# Patient Record
Sex: Female | Born: 1945 | Race: White | Hispanic: No | Marital: Married | Smoking: Former smoker
Health system: Southern US, Community
[De-identification: ages and names within clinical notes are randomized; demographics above are authoritative.]

## PROBLEM LIST (undated history)

## (undated) DIAGNOSIS — E039 Hypothyroidism, unspecified: Secondary | ICD-10-CM

## (undated) DIAGNOSIS — I499 Cardiac arrhythmia, unspecified: Secondary | ICD-10-CM

## (undated) DIAGNOSIS — Z87442 Personal history of urinary calculi: Secondary | ICD-10-CM

## (undated) DIAGNOSIS — Z8719 Personal history of other diseases of the digestive system: Secondary | ICD-10-CM

## (undated) DIAGNOSIS — J189 Pneumonia, unspecified organism: Secondary | ICD-10-CM

## (undated) DIAGNOSIS — R519 Headache, unspecified: Secondary | ICD-10-CM

## (undated) DIAGNOSIS — I272 Pulmonary hypertension, unspecified: Secondary | ICD-10-CM

## (undated) DIAGNOSIS — I4891 Unspecified atrial fibrillation: Secondary | ICD-10-CM

## (undated) DIAGNOSIS — M1990 Unspecified osteoarthritis, unspecified site: Secondary | ICD-10-CM

## (undated) DIAGNOSIS — F419 Anxiety disorder, unspecified: Secondary | ICD-10-CM

## (undated) DIAGNOSIS — H548 Legal blindness, as defined in USA: Secondary | ICD-10-CM

## (undated) DIAGNOSIS — I509 Heart failure, unspecified: Secondary | ICD-10-CM

## (undated) DIAGNOSIS — I10 Essential (primary) hypertension: Secondary | ICD-10-CM

## (undated) DIAGNOSIS — G2581 Restless legs syndrome: Secondary | ICD-10-CM

## (undated) DIAGNOSIS — H3552 Pigmentary retinal dystrophy: Secondary | ICD-10-CM

## (undated) DIAGNOSIS — R51 Headache: Secondary | ICD-10-CM

## (undated) DIAGNOSIS — R002 Palpitations: Secondary | ICD-10-CM

## (undated) DIAGNOSIS — K219 Gastro-esophageal reflux disease without esophagitis: Secondary | ICD-10-CM

## (undated) DIAGNOSIS — R0600 Dyspnea, unspecified: Secondary | ICD-10-CM

## (undated) DIAGNOSIS — Z8489 Family history of other specified conditions: Secondary | ICD-10-CM

## (undated) DIAGNOSIS — E785 Hyperlipidemia, unspecified: Secondary | ICD-10-CM

## (undated) HISTORY — PX: EYE SURGERY: SHX253

## (undated) HISTORY — PX: OTHER SURGICAL HISTORY: SHX169

---

## 1997-08-19 HISTORY — PX: KNEE ARTHROPLASTY: SHX992

## 2005-10-15 ENCOUNTER — Inpatient Hospital Stay (HOSPITAL_COMMUNITY): Admission: RE | Admit: 2005-10-15 | Discharge: 2005-10-18 | Payer: Self-pay | Admitting: Orthopedic Surgery

## 2005-10-16 ENCOUNTER — Ambulatory Visit: Payer: Self-pay | Admitting: Physical Medicine & Rehabilitation

## 2005-11-12 ENCOUNTER — Inpatient Hospital Stay (HOSPITAL_COMMUNITY): Admission: RE | Admit: 2005-11-12 | Discharge: 2005-11-16 | Payer: Self-pay | Admitting: Orthopedic Surgery

## 2005-11-12 ENCOUNTER — Ambulatory Visit: Payer: Self-pay | Admitting: Infectious Diseases

## 2005-11-13 ENCOUNTER — Encounter (INDEPENDENT_AMBULATORY_CARE_PROVIDER_SITE_OTHER): Payer: Self-pay | Admitting: *Deleted

## 2006-01-06 ENCOUNTER — Ambulatory Visit: Payer: Self-pay | Admitting: Infectious Diseases

## 2006-01-14 ENCOUNTER — Inpatient Hospital Stay (HOSPITAL_COMMUNITY): Admission: RE | Admit: 2006-01-14 | Discharge: 2006-01-20 | Payer: Self-pay | Admitting: Orthopedic Surgery

## 2006-01-16 ENCOUNTER — Ambulatory Visit: Payer: Self-pay | Admitting: Infectious Diseases

## 2006-03-18 ENCOUNTER — Inpatient Hospital Stay (HOSPITAL_COMMUNITY): Admission: RE | Admit: 2006-03-18 | Discharge: 2006-03-22 | Payer: Self-pay | Admitting: Orthopedic Surgery

## 2007-09-15 ENCOUNTER — Inpatient Hospital Stay (HOSPITAL_COMMUNITY): Admission: RE | Admit: 2007-09-15 | Discharge: 2007-09-19 | Payer: Self-pay | Admitting: Orthopedic Surgery

## 2011-01-01 NOTE — Op Note (Signed)
[name], [name]               ACCOUNT NO.:  [id number]   MEDICAL RECORD NO.:  [id number]          PATIENT TYPE:  INP   LOCATION:  NA                           FACILITY:  [facility]   PHYSICIAN:  [name]. [name], M.D.  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   FINDINGS INCLUDED:  No obvious signs of infection clinically. There is  synovial fluid. A gram stain and culture were negative from the stat  sent off. There was no evidence of any failed leaking fluid.  No loose  components.   PROCEDURE:  Revision left total knee replacement.   COMPONENTS USED:  A Smith-Nephew knee system with a size 3 Oxinium  femoral component with 15 mm medial distal augmented and a 10 mm distal  lateral augment.  A size 2 revision tibial tray with a 16 x 160 mm  sleeve.  On the femoral side, I did use a 2 mm bolt positioned at the  lateral aspect with the 16 x 160 mm screw fluted stem. An 11 mm  constraint articular insert was used as a stabilized insert.  In  addition two Zimmer cables were utilized of the Vitallium alloy set for  reapproximation of the osteotomy to the femur.   SURGEON:  [name].   ASSISTANT:  [place].   ANESTHESIA:  General.   BLOOD LOSS:  800 cc.   FLUIDS:  Per anesthesia, but did include two units of packed red blood  cells.   COMPLICATIONS:  None apparent.   DRAINS:  Times one.   TOURNIQUET TIME:  Was a total of 54 minutes despite the fact that the  duration of the case was four hours and 15 minutes.   INDICATIONS:  [name] is a 65 year old female known to me on  initial presentation with a painful knee. Procedure workup at the time  was  negative for infection. Revision surgery was carried out. Subsequent  persistent pain lead to the concerns for infection ultimately leading to  one I and D and poly exchange followed by removal of implants, submitted  antibiotic spacer followed by revision implementation. All the time,  there was only a single culture ever obtained that indicated signs of  infection. She had persistent recurring problems and the pain complaints  with swelling of the knee.  Workup was negative for any infection  including aspirate.  Inflammatory markers were elevated. I had her  worked up for a nickel allergy. Initially dermatologic skin testing  followed by laboratory studies in [place], [place], indicate  moderate  nickel sensitivity.  She reported a history of inability to wear any  costume jewelry or anything that would potentially have nickel in it.   After extensive discussions with [name] about the risks and  benefits of proceeding with  this type of procedure including the  persistence of discomfort with failure to treat adequately leading to  further dramatic surgical procedures, consent was obtained.   PROCEDURE IN DETAIL:  The patient was brought to the operative theater.  Once adequate anesthesia was established initially antibiotics were  held, but once the arthrotomy was created the vancomycin was given. The  left lower extremity was prescrubbed and prepped and draped over a  nonsterile thigh tourniquet. The leg was exsanguinated and tourniquet  elevated. The patient's previous incision was excised removing the scar.  An arthrotomy was created due to the scar and limited motion that was  noted.  A quad snip was made approximately 6 cm above the patella enough  for easy exposure. Following this, she had debridement exposure  including the proximal medial field and now attempted to remove the  component.   It is at this part of the procedure that took the advance majority of  the time.  It took approximately 2-1/2 hours to remove the implant.  After initially trying to remove them in a standard fashion by use of  oscillating saw osteotome using a gate Gigli saw for the femur, I was  unable to remove, because my concern of significant disruption.  Actually in the femoral component, I was able to dislodge the femoral  component from the sleeve and spin construct, thus leaving the cement.  The cemented sleeve would needed to be removed.   After considerable time and effort trying to do this, I felt that it was  in the best interest to perform a femoral osteotomy. We created an  osteotomy over the anterior aspect of the femur. After some more time  using the osteotomes, I was able to remove the stem.  I then had to use  a series of drills to remove the cement plug was removed. other than the  osteotomy was created and the defects and previous revision surgery,  there was no significant.  I did place a wire proximal initially to  prevent propagation of any cracks from the osteotomy. At this point, I  did use hand reamers just to get an idea of the size of the canal and  what would need to be done.  I then packed off this area and the pins in  the tibia. The tibial side had proved to be more difficult even than the  femur. After again some time, I tried to remove proximally with no  effort or concerns for significant destruction of tibia. I did make at  this point, a more of an osteotomy or window type appearance. After  freeing up as much as I possibly could in attempting to remove it, there  was a fracture of the tibia in the medial aspect of the tibia.   The lateral aspect of the knee as well as the tibia including the  anterior tubercle area remained intact.  I was able to remove the tibial  prosthesis at this point.  At this point, more time was spent in  removing the cement and the cement restricter distally. Once this was  done, I was now able to attend to the  prosthesis. The entire field  changed gloves and we redraped down the field keeping everything else  sterile.  I irrigated the wound out with approximately three liters of  normal saline solution at this point. Given the nature of the bone that  remaining, the prosthesis was determined fairly easily based on the  selected  size and __________ present. I felt that the patient's tibial  height was normal and did not need further augmentation based on the  removed component.   The size of the tibia was to be a size 2. Again, I decided to use the 16  mm x 160 mm stem base on the stem and the position of the components  fit. I put the tibial trial in with the 14 mm stem initially sitting on  the lateral proximal tibias that reference. As for the distal femur, I  had used some augments on the old revision stem and looking at the  lateral view of my radiographs, I was concerned about potential B[place]  unless I added more augmentation distally in an effort to help to  restore the joint line.  After trialing, I determined that the size 3  femur would fit best. We adjusted the dial and bolt of the tibia and  their bolt position laterally to help with this position. Using the 15  mm augment medially and laterally, I had good fit. There were no other  cuts or reference cuts that needed to be done.   At this point, all trial components were removed after further  preparation of the bone. Please note that after the initial efforts at  removing the prosthesis were slow due to the cement. I had let the  tourniquet down after 18 minutes. I irrigated this wound down again  after the tourniquet was re-elevated at 250 mmHg. The site of the cement  this in two batches to allow me to focus on the cement technique and  fixation.  The trial components were opened and constructed on the back  field. The cement was mixed and the tibial component first. Given the  fact that this was a press fit tibial stem, I only  cemented the proximal  aspect. I basically placed the tibia into the tibia and impacted it to a  level over the lateral proximal tibia with good fit distal. I then  packed in cement around this area. Reapproximating the posterolateral  bone to the cement in close proximity to the proximal tibia. This is the  best that could be done and there was no way to internally fix this.   Once the tibial cement had cured, I attended to the femur. The femur was  constructed on the back table as was the tibia and placed into the femur  the same way.  The proximal portion of the femur was coated with cement  and further defects were closed and then the femur was impacted into the  femoral shaft with good fixation of this fluted stem.   Once the test cement was removed, I did a trial reduction of the poly  height. I ended up using an 11 mm poly and used the constrained version  based on the proximal and medial peel that needed to be required. Did  not want to realign the patient's MCL long term.   Once the surfaces were prepared, the final 11 mm constrained insert was  impacted into the locking mechanism. The knee was reduced. At this  point, we had to open up the Dall-Miles cables, which needed to be  flashed and prepared.  This added some more time to the case. Two Dall-  Miles cables were used to completely reapproximate the bone on over top  of the anterior osteotomy of the femur. This was carried out without  complications. A wire was left proximally to  prevent  fracture  propagation into the femur. The knee was copiously irrigated with normal  saline solution. Pulsavage was carried out multiple times during the  case.  Tourniquet was let down after the final insert was placed. The  total tourniquet time was 58 minutes.   Attention was now directed to closure. Due to the patient's concern and  nickel allergy, the wound was closed with #1 Vicryl closing the quads  snip area of the proximal tibia  and around to the distal aspect of the  knee. 2-0 Vicryl was used in the subcu layer followed by 2-0 nylon on  the skin. The skin was clean and dry dressed sterilely with a Xeroform  and a bulky sterile dressing. She was brought to the recovery room and  extubated in stable condition.      [name], M.D.  Electronically Signed     MDO/MEDQ  D:  09/15/2007  T:  09/16/2007  Job:  [id number]

## 2011-01-01 NOTE — H&P (Signed)
[name], [name]               ACCOUNT NO.:  [id number]   MEDICAL RECORD NO.:  [id number]           PATIENT TYPE:   LOCATION:                                 FACILITY:   PHYSICIAN:  Benjamin L. [name], [place]        DATE OF BIRTH:   DATE OF ADMISSION:  DATE OF DISCHARGE:                              HISTORY & PHYSICAL   SURGEON:  Madlyn Frankel. Charlann Boxer, M.D.   PROCEDURE:  Revision of left total knee arthroplasty.   CHIEF COMPLAINT:  Left knee pain.   HISTORY OF PRESENT ILLNESS:  [name] is a 65 year old female with a  history of left knee pain with a history of left total knee replacement  and subsequent lab work revealing metal allergies specifically to  nickel.  She has been refractory to all conservative treatment and has  had persistent swelling and pain.  She is pre-surgically assessed by her  primary care physician, Dr. Margaree Mackintosh.   PAST MEDICAL HISTORY:  1. Osteoarthritis with left total knee replacement.  2. Migraine headaches.  3. Dyslipidemia.   PAST SURGICAL HISTORY:  Left total knee replacement.   SOCIAL HISTORY:  The patient is married.  Primary caregiver will be her  husband after surgery.   ALLERGIES:  1. BENADRYL.  2. SEPTRA.   MEDICATIONS:  1. Neurontin 600 mg 1-2 p.o. q.h.s.  2. Inderal 40 mg 3 times daily.  3. Requip 2 mg 4 times daily.  4. Estradiol 1 mg 1 time daily.  5. Simvastatin 80 mg 1 time daily.  6. Xyzal 5 mg tablet 1 tablet q.h.s.  7. Levothyroxine 100 mcg tablet take 1 tablet every other day      alternating with 0.088 mg every other day.   Verified medication dosage and frequency amount with the patient at time  of admission.   REVIEW OF SYSTEMS:  HEENT:  Has some blurred vision, dentures and  ringing in the ears.  Otherwise see HPI.   PHYSICAL EXAMINATION:  VITAL SIGNS:  Pulse 60, respirations 18, blood  pressure 152/80.  GENERAL:  Awake, alert, oriented, well-developed, well-nourished, no  acute distress.  NECK:  Supple.  No  carotid bruits.  CHEST/LUNGS:  Clear to auscultation bilaterally.  BREASTS:  Deferred.  HEART:  Regular rate and rhythm.  S1-S2 distinct.  ABDOMEN:  Soft, nontender, nondistended.  Bowel sounds are present.  GENITOURINARY:  Deferred.  EXTREMITIES:  She has 0-100 range of motion.  She has a well-healed  midline incision.  She has brisk capillary refill.  Was unable to  palpate dorsalis pedis pulse today.  SKIN:  No cellulitis.  NEUROLOGIC:  Intact distal sensibilities.   LABORATORY DATA:  Labs, EKG, chest x-ray are pending presurgical  testing.   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   PLAN:  Plan of action is revision of left total knee arthroplasty using  Oxinium and titanium based implants with no nickel.   Date of surgery is September 15, 2007 by surgeon Dr. Durene Romans at  [facility].   Postoperative medications were provided at  time of history and physical  which included Lovenox, Robaxin, iron, aspirin, Colace, MiraLax.  Postoperative pain medicine will be provided at time of surgery.           ______________________________  [name] [place], [place]     BLM/MEDQ  D:  09/04/2007  T:  09/04/2007  Job:  [id number]   cc:   Margaree Mackintosh  Fax: [phone]

## 2011-01-01 NOTE — H&P (Signed)
NAMECHANTALLE, [name]               ACCOUNT NO.:  [id number]   MEDICAL RECORD NO.:  [id number]           PATIENT TYPE:   LOCATION:                                 FACILITY:   PHYSICIAN:  Benjamin L. Mann, [place]   DATE OF BIRTH:  1946-05-29   DATE OF ADMISSION:  DATE OF DISCHARGE:                              HISTORY & PHYSICAL   ADDENDUM TO MEDICATION CORRECTION   Modified medication list per this rather than the original history and  physical.   1. Neurontin 600 mg 1 or 2 at bedtime.  2. Levoxyl 100 mcg one at bedtime.  3. Requip 2 mg 1 tablet four times daily.  4. Inderal 40 mg 1 tablet three times daily.  5. Simvastatin 80 mg one time a day.  6. Estradiol 1 mg one a day in the morning.  7. NoHist extended 1 tablet two times daily.  8. Lyrica 150 mg one the morning and one at night.   Previous job number was [id number].           ______________________________  [name]. [name], [place]     BLM/MEDQ  D:  09/04/2007  T:  09/04/2007  Job:  [id number]

## 2011-01-04 NOTE — H&P (Signed)
[name], [name]               ACCOUNT NO.:  [id number]   MEDICAL RECORD NO.:  [id number]          PATIENT TYPE:  INP   LOCATION:  1515                         FACILITY:  [facility]   PHYSICIAN:  Madlyn Frankel. Charlann Boxer, M.D.  DATE OF BIRTH:  1946-05-07   DATE OF ADMISSION:  10/15/2005  DATE OF DISCHARGE:  10/18/2005                                HISTORY & PHYSICAL   PRINCIPAL DIAGNOSIS:  Painful left total knee replacement.   HISTORY OF PRESENT ILLNESS:  [name] is a 65 year old female who is  status post a left total knee replacement in February 2006.  She has never  really been satisfied with this procedure.  She was reported to me on second  opinion and evaluation.  Work-up for inferior was negative based on lab  results with a normal white count,sed rate and CRP.  Bone scan had indicated  minimal evidence of uptake on the femoral tibial side.  However, this is  within a year of the surgery which made the results questionable.   Nonetheless after review with Mrs. Fanton her current situation and  reviewing the non-operative measures for painful total knee replacement, she  wished to proceed with total knee replacement revision.  Extensive  discussions had been carried out in the office to make sure that she  understood the risks and benefits, the benefits being that we could  potentially improve her symptoms but we have a 50% chance of not affecting  her pain.  She already had the decent range of motion so it cannot  necessarily affect that.   PAST MEDICAL HISTORY:  1.  History of two left knee arthroscopies.  2.  Total knee replacement in 2006.  3.  Hysterectomy.  4.  Cataract surgery.  5.  Right wrist surgery.  6.  Wrist cyst removal.  7.  History of right knee arthroscopy.  8.  History of migraines and blurred vision.   FAMILY HISTORY:  Noncontributory.   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   ALLERGIES:  SEPTRA AND BENADRYL.   CURRENT MEDICATIONS:  Lipitor, Zyrtec, Requip, gabapentin, propranolol,  Syntest, and levothyroxine.   REVIEW OF SYSTEMS:  Otherwise unremarkable at the time of evaluation with no  upper respiratory, pulmonary, cardiac, gastrointestinal, and genitourinary  issues over the past two weeks.   PHYSICAL EXAMINATION:  GENERAL:  She is a healthy female in no acute  distress.  She walks with a limp favoring her left knee.  She is  appropriately dressed.  VITAL SIGNS:  She has a temperature of 98.2, pulse 60, blood pressure  127/72.  HEENT:  Normal ocular movements. Normal speech.  No slurred speech.  NECK:  Supple without any nodes and no bruits on auscultation.  CHEST:  Clear to auscultation bilaterally.  HEART:  Regular rate and rhythm.  No murmurs.  ABDOMEN:  Soft, nontender with positive bowel sounds.  EXTREMITIES:  Examination of her left lower extremity reveals that she has  near full extension passively and actively.  She has full flexion to 110  degrees with stable ligaments.  A little bit toggle in flexion. However, no  large effusion.  Skin is otherwise intact with no signs of erythema or  significant swelling. Otherwise she is neurovascularly stable.   RADIOGRAPHS:  Radiographs reveal total knee replacement components that  appear to be well-positioned. Bone scan indicating loosened uptake of the  femoral and tibial side, but this was indeteminate.  No obvious signs of  clinical loosening.   IMPRESSION:  Painful left total knee replacement.   PLAN:  I agree with Mrs. Liebman's recurrent situation and as outlined  extensively in our discussion.  The evaluation and work-up for painful total  knee replacement is difficult.  Without an ideal source of why her knee  would be hurting her, the results of knee replacement and revision are  sometimes suspect.  Nonetheless, she cannot tolerate any further  nonoperative care or observation and wishes to proceed with total knee   replacement revision.  Risks and benefits were discussed. Consent will be  obtained based on the discussion in the office on October 07, 2005.      Madlyn Frankel Charlann Boxer, M.D.  Electronically Signed     MDO/MEDQ  D:  10/20/2005  T:  10/21/2005  Job:  [id number]

## 2011-01-04 NOTE — H&P (Signed)
NAMEJEZABEL, LECKER NO.:  [id number]   MEDICAL RECORD NO.:  [id number]           PATIENT TYPE:   LOCATION:                                 FACILITY:   PHYSICIAN:  Madlyn Frankel. Charlann Boxer, M.D.       DATE OF BIRTH:   DATE OF ADMISSION:  DATE OF DISCHARGE:                                HISTORY & PHYSICAL   CHIEF COMPLAINT:  Pain in my left knee.   PRESENT ILLNESS:  This 65 year old with knee problems have been going on for  a considerable period of time.  She has had 2 knee arthroscopies to the left  knee.  She had a total knee replacement arthroplasty in 2006.  She has had a  revision with a conversion of her left knee to a Dupuy rotating platform  posterior stabilizing knee system on October 15, 2005.  She did fairly well  after that surgery but recently has had increasing pain and discomfort into  the left knee.  Her progress with physical therapy and range of motion was  diminished due to his pain.  After much consideration including the risks  and benefits of surgery; it was decided that this knee is indeed infected;  and with the appropriate laboratory findings, it was decided to go ahead and  remove the total knee components and put an antibiotic spacer into the left  knee.   We are admitting her day, she will receive a PICC line.  Since she has to  drive a considerable distance, and since we were planning to have her as a  to follow case today, but due to these reduced staffing for evening, the  case would have to be done in the early evening or later.  I Discussed this  both with the patient and with her husband and they were in agreement that  they would rather wait when all the usual total joint staff was available  for this procedure.   PAST MEDICAL HISTORY:  There is really no change from her admission of  10/15/2005.  Other than the knee, as mentioned above.  She has had a  hysterectomy, cataract surgery, right wrist surgery, cyst removal from the  wrist as well.  She has migraines and blurred vision.   FAMILY HISTORY:  Noncontributory   SOCIAL HISTORY:  She is a housewife.  She has no intake of alcohol or  tobacco products.   CURRENT MEDICATIONS:  1.  Levothroid 100 mcg q.p.m.  2.  Lipitor 10 mg q.a.m.  3  Neurontin 600 mg h.s.  1.  Inderal 40 mg p.o. b.i.d.  2.  Requip 1 mg t.i.d.  3.  Zyrtec 100 mg p.o. h.s.  4.  __________  h.s. 1 daily.  5.  Robaxin 500 mg p.o. p.r.n. muscle spasm.  6.  Vicodin p.o. p.r.n.   REVIEW OF SYSTEMS:  Essentially noncontributory, essentially negative other  than her left knee.   PHYSICAL EXAMINATION:  GENERAL:  An alert, cooperative, friendly 65 year old  female seen lying on preadmitting stretcher.  She is accompanied by her  husband, Billey Gosling.  HEENT:  Normocephalic PERLA intact. EOM intact.  Oropharynx is clear.  CHEST:  Clear to auscultation.  No rhonchi, no rales.  HEART:  Regular rate and rhythm.  No murmurs are heard.  ABDOMEN:  Soft, nontender.  Liver and spleen not felt.  GENITALIA AND RECTAL:  Not done, not pertinent to present illness.  EXTREMITIES:  Left knee seen with tenderness to palpation; and somewhat  swollen with some erythema.  There is no drainage noted.  NEUROVASCULAR:  Intact to left lower extremity.   ADMITTING DIAGNOSIS:  Infected left total knee replacement arthroplasty.   PLAN:  The patient will undergo removal of the total knee components and  antibiotic spacer implants.      Dooley L. Cherlynn June.      Madlyn Frankel Charlann Boxer, M.D.  Electronically Signed    DLU/MEDQ  D:  01/14/2006  T:  01/14/2006  Job:  [id number]   cc:   Madlyn Frankel Charlann Boxer, M.D.  Fax: [phone]

## 2011-01-04 NOTE — Op Note (Signed)
NAMEWILMETTA, [name] NO.:  [id number]   MEDICAL RECORD NO.:  [id number]          PATIENT TYPE:  INP   LOCATION:  1519                         FACILITY:  [facility]   PHYSICIAN:  Madlyn Frankel. Charlann Boxer, M.D.  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   PROCEDURE:  Reimplantation of left total knee replacement.   COMPONENTS USED:  DePuy revision knee system with a size 2.5 femur, TC-3  femur with 4-mm augments medially and laterally, posteriorly, 8 mm medially  to help with valgus, a 30 x 13-mm cemented stem with a 20-mm femoral sleeve  to account for femoral defects.  I used a 35 patellar dome.  I used a size 2  MBT revision cemented tray with a 29 cemented tibial sleeve, no augments and  a 12.5 x 2.5 polyethylene insert, posterior-stabilizing.   SURGEON:  Madlyn Frankel. Charlann Boxer, M.D.   ASSISTANT:  Cherly Beach, P.A.-C   ANESTHESIA:  General.   BLOOD LOSS:  100 mL.   TOURNIQUET TIME:  Ninety minutes at 250 mmHg.   DRAINS:  x1.   COMPLICATIONS:  None.   INDICATION FOR PROCEDURE:  [name] is a very pleasant 65 year old female  with a history of left total knee replacement performed on the outside.  She  had had persistent pain throughout a year.  Infective workup had been  negative and subsequently underwent a revision total knee replacement  approximately 5 months ago.  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   An attempted at an I&D and poly exchange was carried out, based on the  sensitivity of the bug identified.  This subsequently recurred with failure.   She then underwent a resection arthroplasty approximately 2 months ago and  has been on  IV Ancef for 6 weeks, off antibiotics for the past 2 weeks.   Examination had revealed that she had no significant swelling or erythema on  her knee, no reports of fevers or chills over the past 2 weeks off  antibiotics.  I reviewed with her risks and benefits of reinfection,  persistent discomfort and stiffness in the knee, given the mobilization that  she has had.   Consent obtained.   PROCEDURE IN DETAIL:  The patient was brought to operative theater.  Once  adequate anesthesia was established, the patient's left lower extremity was  prepped and draped in a sterile fashion over a proximal nonsterile  tourniquet.  The patient's old incision was utilized and sharp dissection  was carried out to identify and to enter soft tissue planes.  Arthrotomy was  created and cultures obtained; Ancef was then given preoperatively.   Knee exposure was obtained carefully  I did place a pin in the proximal  tibial to prevent tibial tubercle avulsion and obtained the exposure without  other techniques  by recreating gutters and debriding scar.  Once the knee  exposure was obtained, I removed the cement spacer block as well as the  cement rods that were placed into the femur and tibia.   Given the fact that I had revised this patient's knee before, I felt very  comfortable with the joint line and the cut that I had made previously.   There was a touch bit of varus on the previously placed revision component,  so I make some cuts along the proximal lateral tibia in order to freshened  this up.   There was no change in the sizing.  I went ahead and prepared the tibia for  the component.  There was a defect that was left after removal of the cement  last time, so I used a 29-mm sleeve and prepared the tibia for this and this  would help with the rotational component.   Once this was carried out, attention was directed to the femur.  Again, I  knew the sizing of the femur to be a size 2.5.  There was  some bone loss  centrally as well as some posteriorly.  I felt that if I used 4-mm augments,  that it helped to maintain my normal external rotation of the component, but  also drop the femur down, helping with my flexion gap.   I also felt that I needed to use an 8-mm augment medially off the distal  side in order to maintain a valgus position of the component and maintain  the joint line at the previously cut surface.   I did use a 20-mm femoral sleeve to fill in some of the defect that was  present from bone loss as well as using the 30-mm cemented stem.   Cement restrictors were placed deep.  The canals were irrigated.  Trial  reduction was carried out with the above components.  With the 12.5 poly,  the knee came to full extension.  Flexion was at this point well-tolerated  with patella tracking very well with a 35 button.  There was tension on the  soft tissues due to the previous flexion contracture due to the  immobilization in a straight position.   Given all these parameters, the final components were brought the field.  Once the canal and cut surface were prepared finally, the tibial component  was cemented in first with a cement gun; I did use vancomycin in the cement  and I used 2 g of vancomycin for 2 bags of cement for the tibia and the same  for the femur.   The tibial component was cemented in first followed by the femur.  I then  placed a 12.5 spacer and then cemented in a 35 patellar button that had been  freshened up and redomed.   Once this cement had cured, excessive cement was debrided out of the  posterior aspect of the knee and the final 12.5 poly posterior-stabilized  rotating platform insert was then placed into the tibial component.   At this point, all the final components were in place and everything felt  nice and stable.  The only problem at this point with the tightness and contracture of her soft tissue.  I tried to reapproximate the extensor   mechanism in flexion and I was unable to do this to 90 degrees due to the  tension and it was thus reapproximated at about 60 degrees.   Since I had sutures in place, I did examine  the stability of the knee.  I  was able to take the knee to about 90 degrees passively with a lot of  tension on the soft tissues.   I did use #1 PDS as suture knot and #1 Vicryl in the closure.   The remaining wound was closed in layers with staples on the skin.  The skin  was cleaned, dried and dressed sterilely with Adaptic dressing, sponges and  tape.  She was taken to the recovery room with a sterile bulky Jones  dressing in  place.      Madlyn Frankel Charlann Boxer, M.D.  Electronically Signed     MDO/MEDQ  D:  03/20/2006  T:  03/20/2006  Job:  [id number]

## 2011-01-04 NOTE — Op Note (Signed)
[name], [name]               ACCOUNT NO.:  [id number]   MEDICAL RECORD NO.:  [id number]          PATIENT TYPE:  INP   LOCATION:  1504                         FACILITY:  [facility]   PHYSICIAN:  Madlyn Frankel. Charlann Boxer, M.D.  DATE OF BIRTH:  [birth year]   DATE OF PROCEDURE:  01/15/2006  DATE OF DISCHARGE:                                 OPERATIVE REPORT   PREOPERATIVE DIAGNOSIS:  Infected left total knee replacement.   POSTOPERATIVE DIAGNOSIS:  Infected left total knee replacement.   PROCEDURE:  Resection arthroplasty, left total knee, with placement of  antibiotic spacer following an incision and drainage with 6 L of normal  saline solution.   SURGEON:  Madlyn Frankel. Charlann Boxer, M.D.   ASSISTANT:  None.   ANESTHESIA:  General.   BLOOD LOSS:  50 mL.   TOURNIQUET TIME:  2 hours 15 minutes with a 10-minute break at the 1 hour  mark.   DRAINS:  X1.   COMPLICATIONS:  None.   INDICATION FOR PROCEDURE:  [name] is a very pleasant 65 year old patient  of mine, whom I have been treating for some time.  She initially presented  with a painful index primary knee replacement performed by an outside  physician.  The workup at the time was negative for infection.  However, she  complained of pain ever since the day her surgery.  At the time of the  revision surgery, tissues appeared to be normal, bone quality appeared to be  good.  There was no concern for obvious infection.  No cultures were  obtained.   Revision knee surgery was carried out with a complicated course.  Unfortunately, in the postoperative period she was doing a normal activity  when she developed pain.  It was an unusual circumstance.  Her knee swelled.  Aspiration revealed infection.  Based on the acuity of the involvement and  when I saw her, she was taken to the operating room for I&D and polyethylene  exchange.  She had initially done very well from this and was on the IV  antibiotics for 6 weeks.  Approximately 2-3 weeks  following this she  presented to the office again with a rather innocuous motion in her problem  and resulting in knee swelling.  Aspiration revealed 4000 white cells and  rare staph aureus or gram-positive cocci.  Based on these findings and the  concern that perhaps this is more of a chronic infection, I discussed with  the patient treatment options, including going back to the operating room  for resection arthroplasty.  Risks and benefits were discussed and the  treatment modality percentages provided.  Consent was obtained.   PROCEDURE IN DETAIL:  The patient was brought to the operative theater.  Once adequate anesthesia was administered, the patient was positioned supine  on the operative table with a proximal thigh tourniquet placed.  The left  lower extremity was then prepped and draped in a sterile fashion.  The  patient's previous incision was utilized.  The patient had a significant  amount of scar tissue.  Tissue planes were identified for the extensor  mechanism.  Medial parapatellar arthrotomy was then created, synovectomy  carried out, upon entry old sutures removed.  The knee exposure was obtained  routinely.  Once this was carried out, a straight osteotome was used  amputate the rotating platform post, removing the polyethylene insert.  At  this point attention was directed to the femoral component using a small  thin oscillating saw blade.  I was able to work around the cement-prosthetic  interval, freeing this up.  Then with the use of osteotomes I was able to  free up the component, which was stem component.  Attention was directed to  this area.  Utilizing the [facility] cement removal set.  With the osteotomes,  gouges as well as drills and back-scratching devices, I was able to remove  the entire portion of cement without complication.  Following this,  attention was directed to the tibial side.   Again, utilizing a thin oscillating saw I was able to get around and  work  around the prosthesis to the point were I could knock it cephalad with the  use of an osteotome.  It was removed without significant bone loss.  Cement  was removed again in the same fashion utilizing the [facility]  instrumentation.   Following the removal of the tibia, attention was direct to the patella and  again using the thin oscillating saw, I was able to remove this.  The  patella lugs were then drilled out with a drill and debrided of cement.   Following this debridement, of components, the knee was irrigated with 6 L  of normal saline solution pulse lavage.   Following this, the cement was mixed on the back table with a 1 g of  vancomycin and 1.2 g of Tobramycin per bag of cement with 4 bags total being  mixed.  Once the cement was ready, I did inject some cement into chest tubes  to place down into the femoral and tibial surfaces.  The rest of the cement  was used as a mold to make a cement spacer.  Another portion was made into a  shield.  Once the cement had cured, the portions were placed into the  femoral and tibial canal and into this space.   Once this was carried out, the extensor mechanism was reapproximated over a  medium Hemovac drain with #1 PDS sutures, 2-0 Vicryl on the subcu layer and  staples on the skin.  The patient's knee was cleaned, dried and dressed  sterilely with Xeroform dressing sponges and a bulky sterile dressing.  She  was placed in a knee immobilizer and brought to the recovery room in stable  condition and without apparent complication.   She will be admitted to the hospital, where she already has a PICC line  placed for a IV antibiotics.  Infectious disease was to be consulted.      Madlyn Frankel Charlann Boxer, M.D.  Electronically Signed     MDO/MEDQ  D:  01/15/2006  T:  01/16/2006  Job:  [id number]

## 2011-01-04 NOTE — Discharge Summary (Signed)
NAMEGERALDY, AKRIDGE               ACCOUNT NO.:  [id number]   MEDICAL RECORD NO.:  [id number]          PATIENT TYPE:  INP   LOCATION:  1604                         FACILITY:  [facility]   PHYSICIAN:  Madlyn Frankel. Charlann Boxer, M.D.  DATE OF BIRTH:  [birth year]   DATE OF ADMISSION:  09/15/2007  DATE OF DISCHARGE:  09/19/2007                               DISCHARGE SUMMARY   ADDENDUM:   DISCHARGE DIAGNOSIS:  Acute blood loss anemia with transfusion.     ______________________________  [name] [name], [place]      Madlyn Frankel. Charlann Boxer, M.D.  Electronically Signed    BLM/MEDQ  D:  11/17/2007  T:  11/17/2007  Job:  [id number]

## 2011-01-04 NOTE — Discharge Summary (Signed)
NAMEANGALA, HILGERS               ACCOUNT NO.:  [id number]   MEDICAL RECORD NO.:  [id number]          PATIENT TYPE:  INP   LOCATION:  1504                         [facility]ACILITY:  [facility]   PHYSICIAN:  Madlyn Frankel. Charlann Boxer, M.D.  DATE OF BIRTH:  [birth year]   DATE OF ADMISSION:  01/14/2006  DATE OF DISCHARGE:  01/20/2006                                 DISCHARGE SUMMARY   ADMITTING DIAGNOSES:  Infected left total knee replacement arthroplasty.   DISCHARGE DIAGNOSIS:  1.  Infected left total knee replacement arthroplasty.  2.  Postoperative blood loss anemia treated with transfusion.   CONSULTATIONS:  Dr. Roxan Hockey and Dr. Orvan Falconer.   OPERATION:  On Jan 15, 2006, the patient underwent resection arthroplasty of  the left knee with placement of antibiotic spacer following incision and  drainage with 6 liters of normal saline solution performed by Dr. Charlann Boxer with  no assistant.   BRIEF HISTORY:  This 65 year old lady had been seen by [place] for continuing  problems concerning her left total knee.  She has had a workup with  aspiration and culture showing no infection to be negative.  She has had  pain since the day of her total knee replacement.  She underwent removal and  re-implant in the past.  She did very well with that but developed pain into  the knee.  She had swelling to the knee, aspiration was done, and infection  was noted.  I&D was done with polyethylene exchange.  She was on IV  antibiotic for six weeks.  She presented again to the office aspiration of a  swollen knee showed 4000 white cells with rare Staph aureus and gram  positive cocci.  It was felt that this is a chronic infection perhaps  seeding from somewhere else, but it was certainly causing a myriad of  problems in this knee.  After much discussion including the risks and  benefits of surgery, it was decided to go ahead with the above procedure.   COURSE IN THE HOSPITAL:  The patient tolerated the surgical procedure quite  well.  She was placed on vancomycin immediately perioperatively and  postoperatively.  It was noted that she had a drop in her hemoglobin  postoperatively to 7.  She was transfused with blood bringing her hemoglobin  up to 11.7.  This was considered postoperative blood loss anemia.  We asked  infectious disease to consult with [place] concerning this patient. Dr. Roxan Hockey  was kind enough to help [place] along with recommendations concerning this  infection.  Fortunately, it was methicillin sensitive and eventually a PICC  line was placed and we were using Ancef as an antibiotics.  This was  recommended by Dr. Roxan Hockey.   On the day of discharge, the patient was stable.  Home health was arranged  for her care, ambulation, as well as IV antibiotics via PICC line.  She has  strong family support.  A TROM brace was placed on her leg postoperatively  to protect it.  It was locked in full extension.  This is to be removed for  skin care, of course, but  when she is up or in bed, she is to wear this  brace.  She is to continue with home medications and diet.   Other laboratory values in the hospital showed a preoperative CBC with an  anemia of 10.9 and hematocrit 32.5, this dropped to 7 with hematocrit of  20.6.  We transfused her and brought her hemoglobin up to 11.7 with  hematocrit 34.6.  Blood chemistries remained normal.  Early culture showed  no growth x3 days and no anaerobes.   CONDITION ON DISCHARGE:  Improved, stable.   PLAN:  The patient is discharged home.  Continue with IV Ancef.  She is to  follow with infectious disease in their clinic.  She is to return to see Dr.  Charlann Boxer two weeks after the date of surgery.  Vicodin is given for discomfort,  Robaxin 500 mg as a muscle relaxant, and Trinsicon for her anemia.  Dry  dressing to the knee p.r.n. and, again, use TROM brace at all times except  for skin care in bed.  She will have IV antibiotics in the form of Ancef for  six weeks by a PICC  line.  We did have the patient on Lovenox but she had a  friend who died from that according to her history, so she was fearful of  Lovenox and will use Coumadin at home for DVT prophylaxis.  She is  encouraged to call should she have any problem and continue her other home  medications and diet.      Dooley L. Cherlynn June.      Madlyn Frankel Charlann Boxer, M.D.  Electronically Signed    DLU/MEDQ  D:  02/28/2006  T:  02/28/2006  Job:  [id number]

## 2011-01-04 NOTE — Op Note (Signed)
[name], [name] NO.:  [id number]   MEDICAL RECORD NO.:  [id number]          PATIENT TYPE:  INP   LOCATION:  X003                         FACILITY:  [facility]   PHYSICIAN:  Madlyn Frankel. Charlann Boxer, M.D.  DATE OF BIRTH:  1946-05-12   DATE OF PROCEDURE:  10/15/2005  DATE OF DISCHARGE:                                 OPERATIVE REPORT   PREOPERATIVE DIAGNOSIS:  Failed painful left total knee replacement.   POSTOPERATIVE DIAGNOSIS:  Failed painful left total knee replacement.   FINDINGS:  There was no evidence of infection on cell count with 900 white  blood cell count and no organisms seen.  One white blood cell seen on Gram's  stain.   PROCEDURE:  Revision, left total knee replacement.   COMPONENTS USED:  DePuy rotating platform posterior stabilized knee system  with a size 2.5 TC3 femur with a 30 mm cemented stem x 13 mm with a 4 mm  distal lateral augment.  I did use a +2 bolt faced anterior.  The tibia side  was a mobile bearing or an MBT tibial tray with a 12.5 mm posterior  stabilized rotating platform polyethylene liner and a 2.5 to match the  femur.  The tibial size was a size 2.  I retained the patient's previous  patella, based on a depth of patellar height of 23 mm.  I was worried about  resecting too much bone, and the patella tracked without application of  pressure.   SURGEON:  Madlyn Frankel. Charlann Boxer, M.D.   ASSISTANTDruscilla Brownie. Cherlynn June.   ANESTHESIA:  General.   TOURNIQUET TIME:  Eight-five minutes at 250 mmHg.   BLOOD LOSS:  150 cc.   COMPLICATIONS:  None.   DRAINS:  One.   INDICATIONS FOR PROCEDURE:  Ms. Mumpower is a 65 year old female who I had  seen in the office for painful left total knee replacement.  Her knee  replacement was performed about year ago.  Workup had been extensive and had  ruled out systemic infection with normal white blood cell count, sed rate,  and CRP.  Other concerns in the differential were slight arthrofibrosis,  as  the patient's range of motion was approximately 115 degrees, lacking only a  few degrees of full flexion and extension of 120 degrees.  Ligaments  appeared to be stable, but on examination, she always had some tightness due  to pain, so it was difficult to ascertain whether or not there was mid  flexion and stability or not.  Other differential would have included the  possibility of RSD; however, she had no signs of skin sensitivity, skin  coloration change, or excessive needs of narcotics.   After reviewing with the patient extensively the risks and benefits of  revision surgery for painful knee with no obvious findings, she at this  point feels that she has failed all conservative attempts and understood the  risks that she may have persistent pain postoperatively.  Other risks of  infection, DVT, component failure, were reviewed but the primary one stress  was the persistent pain postoperatively without an  identifiable source of  failure.  Consent was obtained.   PROCEDURE IN DETAIL:  The patient was brought to the operating theater.  Once adequate anesthesia and preoperative antibiotics, 1 gm of Ancef, were  administered, the patient was positioned supine with the proximal thigh  tourniquet placed.  The left lower extremity was prepped and draped in a  sterile fashion following a prescrub.  The patient's previous incision was  utilized.  Medial parapatellar arthrotomy was then carried out.  Knee  exposure was obtained, including synovectomy and reestablished of the medial  and lateral gutters.  There does not appear to be an excessive amount of  synovial scarring superiorly that would be consistent with a ensuing  patellar clunk phenomenon and a posterior stabilized knee.  Following knee  exposure in a routine fashion, the polyethylene liner was removed without  complications.  I then used a Gigli saw along the anterior phalange of the  femoral component down through the  chamfers.  Then utilizing the 1/4 inch  osteotome, I was able to remove the femoral component without complicating  features.  There was just a little bit of bone loss in the distal condylar  region.   Excessive cement was removed in this area, which was not much.  At this  point, attention was directed to the tibia.  Tibial exposure was obtained  routinely without complications.  Utilizing a very thin oscillating saw, I  did undercut the tibial tray.  Further debridement allowed for exposure.  One of the available tools off of the DePuy set was utilized as an  extraction tool to slap the component out without complication.  No  significant bone loss.  Again, excessive cement removed off the tibial tray  surface.   Once this was carried out, attention was now directed to preparing the bone  for revision knee.  I then hand reamed both canals up to 15 mm to allow for  passage of a 13 mm stem, if needed cementing.   An intramedullary rod was then positioned into the femoral shaft.  I then  made a distal femoral cut.  Essentially, what this amounted to was nothing  off the distal medial side and 4 mm off of the lateral distal side.  For  this reason, we chose a distal lateral augment laterally.   At this point, attention was directed to the tibial surface for sizing  purposes.  An intramedullary rod was positioned, and a 2 degree cut was made  off of the top aspect of the proximal tibia.  This was a very shin cut with  minimal bone removed.   We then sized the tibia to be a size 2.  Final preparation of the tibia was  then carried out, at least from a rotating platform standpoint for the MBT  tray with proximal reaming.  With the size 2 tibia noted, attention was  redirected back to the femur.  Holding femoral component trials up, I felt  that a size 3 or 2.5 would fit best.  Initially, the intramedullary rod was positioned with a centralizing tube distally.  When I placed the size 3   cutting block, despite using the +2 bolt, I felt that it sat very high  anterior, and I was worried about potential femoral over-stuffing.   With this decision, I went ahead with a 2.5 femur.  With using a crab claw,  I was able to identify that this sat right on the anterior femoral cortex.  At this point, the anterior and posterior cuts were made.  There was no  anterior cut to be made and only proximally a 2 mm cut posterior, both  medially and laterally.  No augments needed.  The chamfer cuts were minimal.   At this point, a trial reduction was carried out.  Note that I did cut to a  TC3, as the box cut from the previous component was deep enough to accept  this without significant bone removal.  This would also add to the stability  of the components.   A trial component was placed on the tibial side and keel punched.  The  femoral component was placed.  With a 12.5 poly, the knee came to full  extension and was stable on flexion.  Again, I evaluated the patella at this  point, and the precut measurement was 23 mm.  I felt that if I removed the  patella, there would be concern about removing too much bone.  For this  reason, I kept the patella, and it tracked without application of pressure.  I did not think this would cause any complications.   Given this with the trial components, after the final components were  brought into the field, this included a size 2.5 femur, TC3 with a 30 x 13  mm cemented stem, 4 mm distal lateral augment.  On the tibial side, I used a  size 2 MBT tibial tray with no cemented stem.  The trial components were  removed.  The cement restrictor was placed in the femoral side, and the  femur and tibia surfaces were then cleaned.  Note that throughout the case  for exposure and debridement, synovial scar debridement was carried out, as  well as around the patella.  At the time, a decision to keep the current  patella.   Once the final components were  brought into the field, I did inject the knee  with 60 cc of Marcaine with epinephrine and Toradol.  This would help with  synovial bleeding.   Cement was ready.  The tibial component cemented first, followed by the  femur.  A 12.5 spacer was placed.  The knee brought out to extension.  All  cement cured.  Excessive cement was removed.  Once the cement had cured, the  knee was flexed, and residual cement removed.  Following exposure, I then  did place the 12.5 final posterior stabilized size 2.5 tibial tray.  Again,  the knee came out to full extension and was stable throughout flexion and  range of motion with patella tracking normal.   At this point, the knee was again irrigated with pulsatile lavage.  The  medium Hemovac drain was placed.  An extensor mechanism was then  reapproximated using #1 PDS and flexion.  The remainder of the wound was closed in layers with staples on the skin.  The patient was extubated and  brought to the recovery room in stable condition.      Madlyn Frankel Charlann Boxer, M.D.  Electronically Signed     MDO/MEDQ  D:  10/15/2005  T:  10/15/2005  Job:  [id number]

## 2011-01-04 NOTE — H&P (Signed)
[name], [name] NO.:  [id number]   MEDICAL RECORD NO.:  [id number]           PATIENT TYPE:   LOCATION:                               FACILITY:  [facility]   PHYSICIAN:  Madlyn Frankel. Charlann Boxer, M.D.  DATE OF BIRTH:  [birth year]   DATE OF ADMISSION:  03/18/2006  DATE OF DISCHARGE:                                HISTORY & PHYSICAL   CHIEF COMPLAINT:  Problems with my left knee.   HISTORY OF PRESENT ILLNESS:  This 65 year old white female seen by [place] for  continuing problems concerning pain into her left knee. The patient has a  history of total knee replacement arthroplasty done at another facility.  Subsequently this got infected and she underwent a removal of total knee  with antibiotics spacers. The cultures showed a rare Staphylococcus aureus.  That surgery was performed on Jan 15, 2006. Since that time, she has been on  antibiotics via a PICC line and has done well. She has been placed on a TROM  brace since to allow ambulation. She now presents for surgery concerning  removal of the antibiotic impregnated spacers and reimplant of total knee  replacement arthroplasty. That will occur on March 18, 2006.   PAST MEDICAL HISTORY:  Essentially no change from her previous history and  physical.   FAMILY HISTORY:  Noncontributory.   SOCIAL HISTORY:  She has no intake of alcohol or tobacco products. She is a  housewife.   CURRENT MEDICATIONS:  1.  Levothroid 100 mcg q.p.m.  2.  Neurontin 600 mg h.s.  3.  Inderal 40 mg p.o. b.i.d.  4.  Requip 1 mg t.i.d.  5.  Robaxin 500 mg for muscle spasms.  6.  Vicodin for discomfort.   REVIEW OF SYSTEMS:  Negative other than left knee.   PHYSICAL EXAMINATION:  GENERAL:  Alert and cooperative, friendly, 59-year-  old white female accompanied by her husband and grandson.  VITAL SIGNS:  Blood pressure 122/86, pulse 80, respirations 12.  HEENT:  Normocephalic. PERRLA. EOM intact. Oropharynx is clear.  CHEST:  Clear to auscultation.  No rhonchi, no rales.  HEART:  Regular rate and rhythm. No murmurs are heard.  ABDOMEN:  Soft, nontender, liver, spleen not felt.  GENITALIA/RECTAL/PELVIC/BREASTS:  Not done not pertinent to present illness.  EXTREMITIES:  The left lower extremity is in a TROM brace.  SKIN:  The skin is inspected, no drainage is noted. Mild erythema.   ADMISSION DIAGNOSES:  1.  Status post resection left total knee replacement arthroplasty with      antibiotic implant.  2.  Hypothyroidism.  3.  Dyslipidemia.  4.  History of migraine headaches.   PLAN:  The patient will undergo removal of the antibiotic impregnated  spacers and reimplant of total knee replacement arthroplasty. We will  continue her on antibiotics postoperatively.      Dooley L. Cherlynn June.      Madlyn Frankel Charlann Boxer, M.D.  Electronically Signed    DLU/MEDQ  D:  03/03/2006  T:  03/03/2006  Job:  [id number]

## 2011-01-04 NOTE — H&P (Signed)
[name], [name] NO.:  [id number]   MEDICAL RECORD NO.:  [id number]          PATIENT TYPE:  INP   LOCATION:  5033                         FACILITY:  [facility]   PHYSICIAN:  Madlyn Frankel. Charlann Boxer, M.D.  DATE OF BIRTH:  [birth year]   DATE OF ADMISSION:  11/12/2005  DATE OF DISCHARGE:                                HISTORY & PHYSICAL   CHIEF COMPLAINT:  Pain in my left knee.   HISTORY OF PRESENT ILLNESS:  This 65 year old white female, who underwent  total knee replacement arthroplasty, has done quite well with that knee  until recently when she had increasing pain into the knee.  The knee was  aspirated in the office, and purulent matter was seen with a white count  well over 90,000.  Indeed, this is an infected knee, and we have rescheduled  her for an I&D with washout and change of poly.  She is totally aware of the  situation.  Risks and benefits of surgery have been described to the  patient.   PAST MEDICAL HISTORY:  The patient has occasional headache and has  hypothyroidism.   PAST SURGICAL HISTORY:  Hysterectomy in the past and left total knee  replacement angioplasty.   Dr. Margaree Mackintosh in [place], [place], is her family doctor.   CURRENT MEDICATIONS:  1.  Lipitor 10 mg daily.  2.  Inderal 40 mg daily.  3.  Estratest HS 1 daily.  4.  Requip 1 mg twice daily.  5.  Zyrtec 10 mg in the evening.  6.  Neurontin 600 mg in the evening.  7.  Levothyroxine 0.100 mcg in the evening.  8.  Hydrocodone for pain.   ALLERGIES:  She is allergic to SEPTRA and BENADRYL.   FAMILY HISTORY AND SOCIAL HISTORY:  Can be seen in previous chart.   REVIEW OF SYSTEMS:  Essentially negative other than as in History of Present  Illness.  No recent migraines.   PHYSICAL EXAMINATION:  GENERAL:  Alert, cooperative, friendly, 65 year old  white female.  VITAL SIGNS:  Blood pressure 113/77, pulse 79, respirations 20, temperature  98.1.  HEENT:  Normocephalic.  PERRLA.   EOM intact.  Oropharynx clear.  CHEST: Clear to auscultation. No rhonchi, no rales, no wheezes.  HEART: Regular rate and rhythm.  No murmurs are heard.  ABDOMEN: Soft, nontender.  Liver and spleen not felt.  GENITALIA/RECTAL:  Not done, not pertinent to present illness.  EXTREMITIES:  Left lower extremity is seen with somewhat red knee. There is  no active drainage.  She has limited range of motion secondary to pain.   ADMISSION DIAGNOSES:  1.  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  2.  Hypothyroidism.  3.  History of migraines.   PLAN:  The patient will be admitted for I&D to the left knee.  We will ask  infectious disease to follow along with [place]. We will also ask for PICC line.  We will follow infectious disease recommendations.  Cultures will be taken  at the time of surgery.  Dooley L. Cherlynn June.      Madlyn Frankel Charlann Boxer, M.D.  Electronically Signed    DLU/MEDQ  D:  11/12/2005  T:  11/13/2005  Job:  [id number]

## 2011-01-04 NOTE — Discharge Summary (Signed)
[name], [name]               ACCOUNT NO.:  [id number]   MEDICAL RECORD NO.:  [id number]          PATIENT TYPE:  INP   LOCATION:  1604                         FACILITY:  [facility]   PHYSICIAN:  Madlyn Frankel. Charlann Boxer, M.D.  DATE OF BIRTH:  [birth year]   DATE OF ADMISSION:  09/15/2007  DATE OF DISCHARGE:  09/19/2007                               DISCHARGE SUMMARY   ADMISSION DIAGNOSES:  1. Osteoarthritis.  2. Dyslipidemia.  3. Postmenopausal.  4. Restless leg syndrome.   DISCHARGE DIAGNOSES:  1. Osteoarthritis.  2. Dyslipidemia.  3. Restless leg syndrome.  4. Postmenopausal.   CONSULTATIONS:  None.   PROCEDURE:  Revision of left total knee replacement.   SURGEON:  Madlyn Frankel. Charlann Boxer, M.D.   ASSISTANT:  Dwyane Luo PA-C.   LABS:  Preadmission CBC:  Hematocrit 36.2, tracked throughout her course  of stay, at discharge it was 34.1 and stable.  Her platelets were 123.  Her white cell differential was no significant abnormalities.  Coags  were normal with an INR of 0.9.  Complete metabolic panel preadmission  showed sodium 140, potassium 3.9, glucose 121, BUN was 5, tracked  throughout with no significant trending; at discharge, sodium 137,  potassium 4.4, glucose 103, BUN 3.  Kidney function GFR greater than 16,  calcium 7.7.  UA was negative.   EKG:  Normal sinus rhythm.   RADIOLOGY:  No chest x-ray found in chart.   HOSPITAL COURSE:  The patient underwent revision of left total knee  replacement, tolerated the procedure well, was admitted to the  orthopedic floor.  Seen on day #1, was in a lot of pain.  She had stable  pressures.  Heart rate was between 80 and 90.  Her hematocrit was 26.1.  Went ahead and typed and crossed her, transfused 2 units.  Maintained  the Foley.  We started Lovenox DVT prophylaxis and recharged the Hemovac  for another 4 hours.  It was discontinued later that day.   Postop day #2, pain was decreased with the current pain medicine regimen  and her  hematocrit was back up to 29.8.  Her dressing was clean, dry and  intact.  She had limited PT/OT due to significant pain previously the  day before.  Was felt to be stable enough to discharge home.   Discharged her postop day #3.  Pain was still well-controlled.  Dressing  was changed.  The wound did not have any active drainage, it looked  good.  Neurovascular intact pulse of the left lower extremity.  Weightbearing as tolerated with increasing ambulatory progress, about 80  feet the day before.  Plan was to follow her through the weekend.   Seen on postop day #4.  Pain is still well-controlled.  Afebrile.  Vital  signs stable.  Incision was clear and dry.  Neurovascularly intact left  lower extremity.  We stopped the Ancef.  Hemovac had been out for some  time at this point.  We will continue the PT/OT with plans to send her  home on Monday.  She had increased ambulatory progress of 150 feet.  Later  on that day, the patient became tearful and stated she was ready  to go home.  Due to this is a difficult time getting assistance and felt  she could do just as well at home.  [name] was notified and she was  ready for discharge.   DISCHARGE DISPOSITION:  Discharged home in stable and improved  condition.   DISCHARGE ACTIVITIES:  Physical therapy weightbearing as tolerated.   DISCHARGE DIET:  Regular.   DISCHARGE WOUND CARE:  Keep dry.   DISCHARGE MEDICATIONS:  1. OxyContin 40 mg p.o. b.i.d.  2. Oxycodone 5 mg one to three p.o. q.3 hours pain.  3. Lovenox 40 mg subcu every 24 hours through September 29, 2007; then      aspirin 325 mg one p.o. daily x4 weeks.  4. Iron 324 mg p.o. t.i.d.  5. Colace 100 mg p.o. b.i.d.  6. MiraLax 17 grams p.o. daily.  7. Robaxin 1200 mg p.o. q.6 hours.   HOME MEDICATIONS:  1. Neurontin 600 mg one at bedtime.  2. Levoxyl 100 mcg one at bedtime.  3. Requip 2 mg t.i.d.  4. Inderal 40 mg two p.o. in the morning, one p.o. in the evening.  5.  Simvastatin 80 mg p.o. in the morning.  6. Estradiol 1 mg daily.  7. Lyrica 150 mg p.o. q.a.m. and p.o. at bedtime.   DISCHARGE FOLLOWUP:  Follow up with Dr. Charlann Boxer [phone] in 2 weeks.     ______________________________  [name] [name], [place]      Madlyn Frankel. Charlann Boxer, M.D.  Electronically Signed    BLM/MEDQ  D:  11/17/2007  T:  11/17/2007  Job:  [id number]

## 2011-01-04 NOTE — Discharge Summary (Signed)
NAMERELLA, EGELSTON NO.:  [id number]   MEDICAL RECORD NO.:  [id number]          PATIENT TYPE:  INP   LOCATION:  5033                         FACILITY:  [facility]   PHYSICIAN:  Madlyn Frankel. Charlann Boxer, M.D.  DATE OF BIRTH:  05-16-1946   DATE OF ADMISSION:  11/12/2005  DATE OF DISCHARGE:  11/16/2005                                 DISCHARGE SUMMARY   ADMITTING DIAGNOSES:  Acute infection left total knee replacement  arthroplasty.   DISCHARGE DIAGNOSES:  1.  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  2.  Mild postoperative anemia (non-symptomatic).   OPERATION:  1.  On November 12, 2005 the patient underwent incision and drainage of left      knee with 6 L of normal saline solution plus 1 L of bacitracin-laden      normal saline solution with pulse lavage.  2.  Polyethylene exchange of left knee with 12.5 x 2.5 polyethylene insert,      posterior stabilizing for MBT revision tray.  D.L. Idolina Primer, P.A.-C.      and Romeo Apple Man, P.A. student assisted.   CONSULTS:  Dr. Tinnie Gens C. Hatcher, infectious disease   BRIEF HISTORY:  This 65 year old lady four weeks out from a total knee  replacement arthroplasty did well until a week prior to this admission when  she had new onset of DVT.  Coumadin was stopped and the knee aspirated to  rule out hemarthrosis versus infection.  When the knee was aspirated  purulent material was noted.  She had a 96,000 white count of the knee fluid  which was high suspicion for infection.  I&D was indicated and the patient  was admitted as soon as possible for that procedure as mentioned above.   HOSPITAL COURSE:  Infectious disease saw the patient early on.  Dr. Ninetta Lights  felt that we should continue the vancomycin.  Cultures Gram smear showed no  organisms, moderate wbc, primarily PNNs with a few Staphylococcus aureus  seen.  It was sensitive to all but penicillin.  Ancef and rifampin was used  postoperatively.  The Rifampin was  discontinued on November 15, 2005.  Overall,  the patient had minimal tenderness to her knee and PICC line was placed for  long-term Ancef therapy.  On day of discharge the patient was afebrile.  She  did have some mild nausea prior to discharge but this resolved.  She was  discharged home with mild analgesics.  [facility] will continue  with her therapy.   LABORATORIES:  Preoperative CBC which showed a mild anemia, hemoglobin 10.7,  hematocrit 31.2.  Final hemoglobin was 8.7, hematocrit was 25.9.  Urinalysis  negative for urinary tract infection.  The culture showed few Staphylococcus  aureus.   CONDITION ON DISCHARGE:  Improved, stable.   PLAN:  The patient will continue with Ancef IV via PICC line.  Genevieve Norlander will  manage that.  She will take one aspirin a day at 325 mg b.i.d. for two  weeks.  No Coumadin was used.  Vicodin for discomfort.  Robaxin as a muscle  relaxant and Reglan for any  nausea.  Return to see Dr. Charlann Boxer at one week to  10 days from surgery and call if she should have any problems or questions.  She may weight bear as tolerated.      Dooley L. Cherlynn June.      Madlyn Frankel Charlann Boxer, M.D.  Electronically Signed    DLU/MEDQ  D:  12/12/2005  T:  12/13/2005  Job:  [id number]

## 2011-01-04 NOTE — Op Note (Signed)
[name], FITZWATER NO.:  [id number]   MEDICAL RECORD NO.:  [id number]          PATIENT TYPE:  INP   LOCATION:  5033                         FACILITY:  [facility]   PHYSICIAN:  Madlyn Frankel. Charlann Boxer, M.D.  DATE OF BIRTH:  1946/04/29   DATE OF PROCEDURE:  11/12/2005  DATE OF DISCHARGE:                                 OPERATIVE REPORT   PREOPERATIVE DIAGNOSIS:  Infected left total knee replacement.   POSTOPERATIVE DIAGNOSIS:  Acute infection, left total knee replacement.   OPERATION PERFORMED:  1.  Incision and drainage of left knee with 6 L of normal saline solution      plus 1 L of bacitracin laden normal saline solution with pulse lavage.  2.  Polyethylene exchange of left knee with a 12.5 x 2.5 polyethylene      insert, posterior stabilized for MBT revision tray.   SURGEON:  Madlyn Frankel. Charlann Boxer, M.D.   ASSISTANTDruscilla Brownie. Cherlynn June. and Dwyane Luo, [place] student.   ANESTHESIA:  General.   ESTIMATED BLOOD LOSS:  200 mL.   TOURNIQUET TIME:  45 minutes at 250 mmHg.   COMPLICATIONS:  None.   DRAINS:  x1.   INDICATIONS FOR PROCEDURE:  Ms. Krichbaum is a 65 year old female who is now  approximately four weeks out from her total knee replacement.  She had done  very well from her revision total knee replacement until about one week  prior to procedure when she presented to the office with some new onset DVT,  her Coumadin stopped, knee aspirated to rule out hemarthrosis versus  infection.  Aspirate revealed a purulent material.  Lab sent revealed 96,000  white count confirming suspicion for infection.   I had discussed with at time of initial aspiration and concerns about  infection that she should go to incision and drainage as soon as possible.  Surgery was set up for the next day.  The risks of surgery would be  recurrent infection, DVT, arthrofibrosis. Consent was obtained.   DESCRIPTION OF PROCEDURE:  The patient was brought to the operating theater.  Once  adequate anesthesia was established, the patient had received a gram of  Ancef.  The patient's left lower extremity was then prepped and draped over  the proximal thigh nonsterile tourniquet.  The previous incision was  excised.  Sharp dissection was carried down to the extensor mechanism.  The  medial parapatellar arthrotomy was then made with removal of sutures.  Knee  exposure obtained routinely.  Synovectomy carried out debridement of  tissues.  There was not a very large amount of purulent material that was  evident.  I has aspirated the knee before removing about 15 to 20 mL of  fluid; however, this did not look as bad.  Probably due to the acuity.   Once the knee was exposed, using osteotome to amputate the post on the  rotating platform knee, we then removed the polyethylene.  Knee exposure was  obtained, further debridement carried out with a rongeur and a Bovie  performing synovectomies.  Following this, the knee was irrigated with 6 L  of normal saline solution followed by a liter of bacitracin laden normal  saline solution and pulse lavage.  Following this, the knee was cleaned,  dried, hemostasis obtained.  The final revised poly 12.5 x size 2.5  polyethylene was then placed into the tibial tray and the knee reduced.  The  knee came to full extension with excellent range of motion at this point.   Note that the patient had developed significant amount of arthrofibrosis  over the past week requiring manipulation but this was not in the title of  the procedures.   Following placement of polyethylene, the extension mechanism was  reapproximated using a #1 PDS.  I then used 2-0 Monocryl in the skin  followed by 2-0 nylon on the skin edges.  The knee was cleaned, dried and  dressed sterilely with Xeroform and a bulky Jones dressing.  The patient was  then transferred to recovery room, extubated in stable condition.      Madlyn Frankel Charlann Boxer, M.D.  Electronically Signed      MDO/MEDQ  D:  11/12/2005  T:  11/14/2005  Job:  [id number]

## 2011-01-04 NOTE — Discharge Summary (Signed)
NAMEMELANNY, WIRE NO.:  [id number]   MEDICAL RECORD NO.:  [id number]          PATIENT TYPE:  INP   LOCATION:  1519                         FACILITY:  [facility]   PHYSICIAN:  Madlyn Frankel. Charlann Boxer, M.D.  DATE OF BIRTH:  Aug 06, 1946   DATE OF ADMISSION:  03/18/2006  DATE OF DISCHARGE:  03/22/2006                                 DISCHARGE SUMMARY   ADMITTING DIAGNOSES:  1. Status post resection left total knee replacement arthroplasty with      antibiotic implant.  2. Hypothyroidism.  3. Dyslipidemia.  4. History of migraine headaches.   DISCHARGE DIAGNOSES:  1. Postoperative anemia.  2. 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   OPERATION:  On March 08, 2006, the patient underwent reimplantation of left  total knee replacement arthroplasty.  Jenne Campus PA-C assisted.   BRIEF HISTORY:  This 65 year old lady, who had a left total knee replacement  arthroplasty at another facility, had persistent pain for a year after that  knee replacement.  The patient underwent a revision total knee replacement  arthroplasty 5 months prior to this admission.  Workup for infection was  negative at that time.  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  I&D and poly  exchange was done, but unfortunately the patient had recurring failure.  We  chose to do a resection arthroplasty 2 months prior to this admission, and  the patient has been on 6 weeks of IV Ancef.  We stopped the antibiotics  about 2 weeks prior to this admission.  The knee was quiet.  It had no  swelling or erythema.  She had no fevers or chills while she was off  antibiotics.  It was hoped that the infection previously treated was gone,  so after much discussion, including the risks and benefits of this  particular surgery, she underwent above procedure.   COURSE IN THE HOSPITAL:  The patient tolerated the surgical procedure quite  well.  We restricted her range of motion, as far as its passive nature was  concerned, to 90 degrees.  The patient had an episode of hypotension  postoperatively.  She did have anemia at 7.8 on the hemoglobin and  hematocrit of 23.8.  Unfortunately, due to certain antibiotics in her blood,  no match was found in the blood bank.  We, therefore, could not transfuse  her.  She actually was not that symptomatic.  She was ambulating, working  with physical therapy.  She certainly knew the total knee protocol, even  with the restrictions mentioned above.  Her knee wound was clean and dry.  Staples were intact.  She also remained afebrile.  It was felt that she  could be discharged with home health, and once the arrangements were made  she was discharged home.   The cultures done at surgery from her knee wound showed no white cells, no  organisms by Gram stain.  Culture showed no growth x2 days, and no anaerobes  were isolated.  Laboratory values in the hospital hematologically showed a  preoperative CBC with a normal  white count.  RBCs were 3.76, hemoglobin was  11.6, hematocrit was 34.50.  Differential was normal.  Final hemoglobin was  7.6, hematocrit of 21.9.  Blood chemistries remained normal.  Urinalysis was  negative for a urinary tract infection.  Cultures were as above.  No  electrocardiogram nor chest x-ray done on this admission.   CONDITION ON DISCHARGE:  Improved, stable.   PLAN:  The patient is discharged to her home.  She is to restrict flexion to  about 90 degrees.  She will be on continuing Coumadin protocol per pharmacy,  oxycodone 5 mg one every 4 hours for breakthrough pain, Tylenol two q.6h.,  Robaxin 500 mg p.r.n. muscle spasm and Trinsicon t.i.d. as an iron  supplement.  She is to only have gentle range of motion of the left knee,  and we cannot place her beyond 90 degrees.  This has to on for least 6 weeks  so will allow the tissue to heal.  She will [place] as home  health.  Return  to see Dr. Charlann Boxer about 2 weeks after the date of surgery, and certainly call  should she have any swelling or change in her knee.      Dooley L. Cherlynn June.      Madlyn Frankel Charlann Boxer, M.D.  Electronically Signed    DLU/MEDQ  D:  03/28/2006  T:  03/28/2006  Job:  [id number]

## 2011-01-04 NOTE — Discharge Summary (Signed)
[name], Boyer               ACCOUNT NO.:  [id number]   MEDICAL RECORD NO.:  [id number]          PATIENT TYPE:  INP   LOCATION:  1515                         FACILITY:  [facility]   PHYSICIAN:  Madlyn Frankel. Charlann Boxer, M.D.  DATE OF BIRTH:  Oct 18, 1945   DATE OF ADMISSION:  10/15/2005  DATE OF DISCHARGE:  10/18/2005                                 DISCHARGE SUMMARY   ADMISSION DIAGNOSES:  1.  Painful left total knee replacement.  2.  History of two left knee arthroscopies.  3.  History of total knee replacement in 2006.  4.  History of hysterectomy, cataract surgery, right wrist surgery.   DISCHARGE DIAGNOSES:  1.  Painful left total knee replacement.  2.  History of two left knee arthroscopies.  3.  History of total knee replacement in 2006.  4.  History of hysterectomy, cataract surgery, right wrist surgery.  5.  Mild postoperative anemia.   OPERATION:  On October 15, 2005, the patient underwent revision left total  knee replacement arthroplasty to DePuy rotating platform posterior  stabilized knee system.  The patella prosthesis from previous surgery was  retained, and the femoral and tibial components were cemented into the bone.  No evidence of infection on cell count, and no organisms seen at the time of  surgery.  Jenne Campus assisted.   BRIEF HISTORY:  This 65 year old lady had painful left total knee  replacement arthroplasty.  The knee was done at another office.  Systemic  infection as well as sed rate and CRP were all normal, ruling out infection.  After much discussion, including the risks and benefits of surgery, it was  decided to go ahead with the above procedure.   HOSPITAL COURSE:  The patient tolerated surgical procedure quite well.  She  was entered into the total knee protocol postoperatively.   We found that she was extremely sensitive to Coumadin with a marked increase  of INR in the past, so we advised pharmacy, and they worked very carefully  with the  patient with dispensing Coumadin.   Overall, she did quite well.  The wound remained clean and dry.  She was  ambulating and flexing and extending the knee with minimal problems with  pain.  She was anxious to go home and continue with her rehabilitation at  home with home health, so arrangements were made for that.  No critical  incidence while in the hospital.   Laboratory values in the hospital hematologically showed preoperative CBC  completely within normal limits.  Hemoglobin is 13.5 with a hematocrit of  39.5.  Final hemoglobin was 8.9 with a hematocrit of 25.8.  Blood  chemistries remained normal, and urinalysis was negative for a urinary tract  infection.  The fluid from the knee, which was sent to the lab showed no  growth in three days.  No organism was seen on Gram's stain, and anaerobic  showed no organisms, no anaerobes isolated.   Chest x-ray showed no active disease.   No electrocardiogram on this chart.   CONDITION ON DISCHARGE:  Improved, stable.   PLAN:  The  patient was discharged to her home in the care of her family, to  continue with home health.  Continue with home medications and diet.  She  can weight bear as tolerated.  She is to use dry dressing p.r.n.  Return to  see Dr. Charlann Boxer in two weeks after the date of surgery.   DISCHARGE MEDICATIONS:  Vicodin for pain, Robaxin for muscle relaxant and  Trinsicon to help build up her blood as well as Coumadin for DVT  prophylaxis.  She is to call should she have any problems or questions and  follow up with her medical doctor as needed.      [name].      Madlyn Frankel Charlann Boxer, M.D.  Electronically Signed    DLU/MEDQ  D:  10/23/2005  T:  10/23/2005  Job:  [id number]

## 2011-05-09 LAB — TYPE AND SCREEN
Donor AG Type: NEGATIVE
Donor AG Type: NEGATIVE
PT AG Type: NEGATIVE

## 2011-05-09 LAB — BASIC METABOLIC PANEL
BUN: 3 — ABNORMAL LOW
BUN: 5 — ABNORMAL LOW
CO2: 23
CO2: 26
Calcium: 7.2 — ABNORMAL LOW
Calcium: 7.7 — ABNORMAL LOW
Calcium: 9
Chloride: 110
Creatinine, Ser: 0.63
GFR calc Af Amer: >60
GFR calc non Af Amer: >60
Glucose, Bld: 121 — ABNORMAL HIGH
Potassium: 3.9
Potassium: 4.4
Sodium: 137
Sodium: 139
Sodium: 140

## 2011-05-09 LAB — GRAM STAIN: Gram Stain: NONE SEEN

## 2011-05-09 LAB — DIFFERENTIAL: Eosinophils Relative: 7 — ABNORMAL HIGH

## 2011-05-09 LAB — ANAEROBIC CULTURE

## 2011-05-09 LAB — CBC
HCT: 26.1 — ABNORMAL LOW
HCT: 36.2
Hemoglobin: 10.5 — ABNORMAL LOW
Hemoglobin: 12.7
Hemoglobin: 9.1 — ABNORMAL LOW
MCHC: 34.6
MCHC: 35
MCV: 90.4
MCV: 90.7
Platelets: 123 — ABNORMAL LOW
Platelets: 99 — ABNORMAL LOW
RBC: 3.3 — ABNORMAL LOW
RBC: 3.75 — ABNORMAL LOW
RBC: 4.03
RDW: 13
RDW: 14.5
RDW: 14.6
RDW: 14.6
WBC: 6.1

## 2011-05-09 LAB — POCT I-STAT 4, (NA,K, GLUC, HGB,HCT)
Glucose, Bld: 101 — ABNORMAL HIGH
HCT: 26 — ABNORMAL LOW
Hemoglobin: 8.8 — ABNORMAL LOW

## 2011-05-09 LAB — BODY FLUID CULTURE
Culture: NO GROWTH
Gram Stain: NONE SEEN

## 2011-05-09 LAB — PROTIME-INR: INR: 0.9

## 2011-05-09 LAB — URINALYSIS, ROUTINE W REFLEX MICROSCOPIC
Glucose, UA: NEGATIVE
Hgb urine dipstick: NEGATIVE
Specific Gravity, Urine: 1.027
pH: 6

## 2012-07-10 DIAGNOSIS — G5600 Carpal tunnel syndrome, unspecified upper limb: Secondary | ICD-10-CM | POA: Insufficient documentation

## 2014-08-19 HISTORY — PX: NECK SURGERY: SHX720

## 2015-06-01 DIAGNOSIS — E785 Hyperlipidemia, unspecified: Secondary | ICD-10-CM | POA: Insufficient documentation

## 2015-06-01 DIAGNOSIS — E039 Hypothyroidism, unspecified: Secondary | ICD-10-CM | POA: Diagnosis present

## 2016-10-02 DIAGNOSIS — K2100 Gastro-esophageal reflux disease with esophagitis, without bleeding: Secondary | ICD-10-CM | POA: Insufficient documentation

## 2016-10-02 DIAGNOSIS — E782 Mixed hyperlipidemia: Secondary | ICD-10-CM | POA: Insufficient documentation

## 2016-11-05 ENCOUNTER — Other Ambulatory Visit (HOSPITAL_COMMUNITY): Payer: Self-pay | Admitting: Emergency Medicine

## 2016-11-05 ENCOUNTER — Encounter (HOSPITAL_COMMUNITY): Payer: Self-pay

## 2016-11-05 DIAGNOSIS — G2581 Restless legs syndrome: Secondary | ICD-10-CM | POA: Diagnosis present

## 2016-11-05 NOTE — Patient Instructions (Signed)
[name]  11/05/2016   Your procedure is scheduled on: 11-11-16  Report to [facility] Main  Entrance take [facility]  elevators to 3rd floor to  Short Stay Center at [phone].  Call this number if you have problems the morning of surgery [phone]   Remember: ONLY 1 PERSON MAY GO WITH YOU TO SHORT STAY TO GET  READY MORNING OF YOUR SURGERY.  Do not eat food or drink liquids :After Midnight.     Take these medicines the morning of surgery with A SIP OF WATER: levothyroxine(Synthroid), propanolol(Inderal), pantoprazole(Protonix), risperidone(Risperdal), ropinirole(Requip)                                 You may not have any metal on your body including hair pins and              piercings  Do not wear jewelry, make-up, lotions, powders or perfumes, deodorant             Do not wear nail polish.  Do not shave  48 hours prior to surgery.              Men may shave face and neck.   Do not bring valuables to the hospital. [facility] IS NOT             RESPONSIBLE   FOR VALUABLES.  Contacts, dentures or bridgework may not be worn into surgery.  Leave suitcase in the car. After surgery it may be brought to your room.              Please read over the following fact sheets you were given: _____________________________________________________________________             [facility] - Preparing for Surgery Before surgery, you can play an important role.  Because skin is not sterile, your skin needs to be as free of germs as possible.  You can reduce the number of germs on your skin by washing with CHG (chlorahexidine gluconate) soap before surgery.  CHG is an antiseptic cleaner which kills germs and bonds with the skin to continue killing germs even after washing. Please DO NOT use if you have an allergy to CHG or antibacterial soaps.  If your skin becomes reddened/irritated stop using the CHG and inform your nurse when you arrive at Short Stay. Do not shave  (including legs and underarms) for at least 48 hours prior to the first CHG shower.  You may shave your face/neck. Please follow these instructions carefully:  1.  Shower with CHG Soap the night before surgery and the  morning of Surgery.  2.  If you choose to wash your hair, wash your hair first as usual with your  normal  shampoo.  3.  After you shampoo, rinse your hair and body thoroughly to remove the  shampoo.                           4.  Use CHG as you would any other liquid soap.  You can apply chg directly  to the skin and wash                       Gently with a scrungie or clean washcloth.  5.  Apply the CHG Soap  to your body ONLY FROM THE NECK DOWN.   Do not use on face/ open                           Wound or open sores. Avoid contact with eyes, ears mouth and genitals (private parts).                       Wash face,  Genitals (private parts) with your normal soap.             6.  Wash thoroughly, paying special attention to the area where your surgery  will be performed.  7.  Thoroughly rinse your body with warm water from the neck down.  8.  DO NOT shower/wash with your normal soap after using and rinsing off  the CHG Soap.                9.  Pat yourself dry with a clean towel.            10.  Wear clean pajamas.            11.  Place clean sheets on your bed the night of your first shower and do not  sleep with pets. Day of Surgery : Do not apply any lotions/deodorants the morning of surgery.  Please wear clean clothes to the hospital/surgery center.  FAILURE TO FOLLOW THESE INSTRUCTIONS MAY RESULT IN THE CANCELLATION OF YOUR SURGERY PATIENT SIGNATURE_________________________________  NURSE SIGNATURE__________________________________  ________________________________________________________________________   Jill MireIncentive Spirometer  An incentive spirometer is a tool that can help keep your lungs clear and active. This tool measures how well you are filling your lungs with  each breath. Taking long deep breaths may help reverse or decrease the chance of developing breathing (pulmonary) problems (especially infection) following:  A long period of time when you are unable to move or be active. BEFORE THE PROCEDURE   If the spirometer includes an indicator to show your best effort, your nurse or respiratory therapist will set it to a desired goal.  If possible, sit up straight or lean slightly forward. Try not to slouch.  Hold the incentive spirometer in an upright position. INSTRUCTIONS FOR USE  1. Sit on the edge of your bed if possible, or sit up as far as you can in bed or on a chair. 2. Hold the incentive spirometer in an upright position. 3. Breathe out normally. 4. Place the mouthpiece in your mouth and seal your lips tightly around it. 5. Breathe in slowly and as deeply as possible, raising the piston or the ball toward the top of the column. 6. Hold your breath for 3-5 seconds or for as long as possible. Allow the piston or ball to fall to the bottom of the column. 7. Remove the mouthpiece from your mouth and breathe out normally. 8. Rest for a few seconds and repeat Steps 1 through 7 at least 10 times every 1-2 hours when you are awake. Take your time and take a few normal breaths between deep breaths. 9. The spirometer may include an indicator to show your best effort. Use the indicator as a goal to work toward during each repetition. 10. After each set of 10 deep breaths, practice coughing to be sure your lungs are clear. If you have an incision (the cut made at the time of surgery), support your incision when coughing by placing a pillow or rolled up towels firmly  against it. Once you are able to get out of bed, walk around indoors and cough well. You may stop using the incentive spirometer when instructed by your caregiver.  RISKS AND COMPLICATIONS  Take your time so you do not get dizzy or light-headed.  If you are in pain, you may need to take or  ask for pain medication before doing incentive spirometry. It is harder to take a deep breath if you are having pain. AFTER USE  Rest and breathe slowly and easily.  It can be helpful to keep track of a log of your progress. Your caregiver can provide you with a simple table to help with this. If you are using the spirometer at home, follow these instructions: SEEK MEDICAL CARE IF:   You are having difficultly using the spirometer.  You have trouble using the spirometer as often as instructed.  Your pain medication is not giving enough relief while using the spirometer.  You develop fever of 100.5 F (38.1 C) or higher. SEEK IMMEDIATE MEDICAL CARE IF:   You cough up bloody sputum that had not been present before.  You develop fever of 102 F (38.9 C) or greater.  You develop worsening pain at or near the incision site. MAKE SURE YOU:   Understand these instructions.  Will watch your condition.  Will get help right away if you are not doing well or get worse. Document Released: 12/16/2006 Document Revised: 10/28/2011 Document Reviewed: 02/16/2007 Exit[employer] Patient Information 2014 ExitCare, [place].   ________________________________________________________________________  WHAT IS A BLOOD TRANSFUSION? Blood Transfusion Information  A transfusion is the replacement of blood or some of its parts. Blood is made up of multiple cells which provide different functions.  Red blood cells carry oxygen and are used for blood loss replacement.  White blood cells fight against infection.  Platelets control bleeding.  Plasma helps clot blood.  Other blood products are available for specialized needs, such as hemophilia or other clotting disorders. BEFORE THE TRANSFUSION  Who gives blood for transfusions?   Healthy volunteers who are fully evaluated to make sure their blood is safe. This is blood bank blood. Transfusion therapy is the safest it has ever been in the practice of  medicine. Before blood is taken from a donor, a complete history is taken to make sure that person has no history of diseases nor engages in risky social behavior (examples are intravenous drug use or sexual activity with multiple partners). The donor's travel history is screened to minimize risk of transmitting infections, such as malaria. The donated blood is tested for signs of infectious diseases, such as HIV and hepatitis. The blood is then tested to be sure it is compatible with you in order to minimize the chance of a transfusion reaction. If you or a relative donates blood, this is often done in anticipation of surgery and is not appropriate for emergency situations. It takes many days to process the donated blood. RISKS AND COMPLICATIONS Although transfusion therapy is very safe and saves many lives, the main dangers of transfusion include:   Getting an infectious disease.  Developing a transfusion reaction. This is an allergic reaction to something in the blood you were given. Every precaution is taken to prevent this. The decision to have a blood transfusion has been considered carefully by your caregiver before blood is given. Blood is not given unless the benefits outweigh the risks. AFTER THE TRANSFUSION  Right after receiving a blood transfusion, you will usually feel much better and  more energetic. This is especially true if your red blood cells have gotten low (anemic). The transfusion raises the level of the red blood cells which carry oxygen, and this usually causes an energy increase.  The nurse administering the transfusion will monitor you carefully for complications. HOME CARE INSTRUCTIONS  No special instructions are needed after a transfusion. You may find your energy is better. Speak with your caregiver about any limitations on activity for underlying diseases you may have. SEEK MEDICAL CARE IF:   Your condition is not improving after your transfusion.  You develop  redness or irritation at the intravenous (IV) site. SEEK IMMEDIATE MEDICAL CARE IF:  Any of the following symptoms occur over the next 12 hours:  Shaking chills.  You have a temperature by mouth above 102 F (38.9 C), not controlled by medicine.  Chest, back, or muscle pain.  People around you feel you are not acting correctly or are confused.  Shortness of breath or difficulty breathing.  Dizziness and fainting.  You get a rash or develop hives.  You have a decrease in urine output.  Your urine turns a dark color or changes to pink, red, or brown. Any of the following symptoms occur over the next 10 days:  You have a temperature by mouth above 102 F (38.9 C), not controlled by medicine.  Shortness of breath.  Weakness after normal activity.  The white part of the eye turns yellow (jaundice).  You have a decrease in the amount of urine or are urinating less often.  Your urine turns a dark color or changes to pink, red, or brown. Document Released: 08/02/2000 Document Revised: 10/28/2011 Document Reviewed: 03/21/2008 [facility] Patient Information 2014 [place], [place].  _______________________________________________________________________

## 2016-11-05 NOTE — Progress Notes (Addendum)
LOV Dr Nolen MuMcKinney 10-02-16 epic (care everywhere)  EKG 11-05-16 on chart  medical clearance  Note Dr Nolen MuMcKinney 11-05-16 on chart

## 2016-11-06 ENCOUNTER — Encounter (HOSPITAL_COMMUNITY)
Admission: RE | Admit: 2016-11-06 | Discharge: 2016-11-06 | Disposition: A | Discharge status: Home / Self Care | Payer: Medicare Other | Source: Ambulatory Visit | Attending: Orthopedic Surgery | Admitting: Orthopedic Surgery

## 2016-11-06 ENCOUNTER — Encounter (HOSPITAL_COMMUNITY): Payer: Self-pay | Admitting: Emergency Medicine

## 2016-11-06 DIAGNOSIS — T84093A Other mechanical complication of internal left knee prosthesis, initial encounter: Secondary | ICD-10-CM | POA: Insufficient documentation

## 2016-11-06 DIAGNOSIS — Z01812 Encounter for preprocedural laboratory examination: Secondary | ICD-10-CM | POA: Diagnosis present

## 2016-11-06 HISTORY — DX: Gastro-esophageal reflux disease without esophagitis: K21.9

## 2016-11-06 HISTORY — DX: Hyperlipidemia, unspecified: E78.5

## 2016-11-06 HISTORY — DX: Palpitations: R00.2

## 2016-11-06 HISTORY — DX: Restless legs syndrome: G25.81

## 2016-11-06 HISTORY — DX: Pigmentary retinal dystrophy: H35.52

## 2016-11-06 HISTORY — DX: Unspecified osteoarthritis, unspecified site: M19.90

## 2016-11-06 HISTORY — DX: Hypothyroidism, unspecified: E03.9

## 2016-11-06 LAB — BASIC METABOLIC PANEL
Anion gap: 7 (ref 5–15)
BUN: 12 mg/dL (ref 6–20)
CO2: 28 mmol/L (ref 22–32)
Calcium: 9.1 mg/dL (ref 8.9–10.3)
Chloride: 107 mmol/L (ref 101–111)
Creatinine, Ser: 0.82 mg/dL (ref 0.44–1.00)
GFR calc Af Amer: >60 mL/min (ref >60)
GLUCOSE: 99 mg/dL (ref 65–99)
POTASSIUM: 4.5 mmol/L (ref 3.5–5.1)
Sodium: 142 mmol/L (ref 135–145)

## 2016-11-06 LAB — CBC
HEMATOCRIT: 38 % (ref 36.0–46.0)
HEMOGLOBIN: 12.3 g/dL (ref 12.0–15.0)
MCH: 29.4 pg (ref 26.0–34.0)
MCHC: 32.4 g/dL (ref 30.0–36.0)
MCV: 90.9 fL (ref 78.0–100.0)
PLATELETS: 215 10*3/uL (ref 150–400)
RBC: 4.18 MIL/uL (ref 3.87–5.11)
RDW: 14.2 % (ref 11.5–15.5)
WBC: 8.4 10*3/uL (ref 4.0–10.5)

## 2016-11-06 LAB — SURGICAL PCR SCREEN
MRSA, PCR: NEGATIVE
Staphylococcus aureus: POSITIVE — AB

## 2016-11-10 NOTE — H&P (Signed)
[name] is an 71 y.o. female.    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 female complaining of left knee pain for the last couple of months. Pain had continually increased since the beginning.  History of 7 surgeries, the last being a revision about 9 yrs ago per the patient.  X-rays in the clinic show revision components in the left knee. Pt has tried various conservative treatments which have failed to alleviate their symptoms, including activity modification, NSAIDs / analgesic medications.   She rates the pain in the knee at 8.5-9 on a 10 point scale when it is at it's worst. States that she has more pain with activity and it really doesn't hurt at night or wake her up.  Various options are discussed with the patient. Risks, benefits and expectations were discussed with the patient. Patient understand the risks, benefits and expectations and wishes to proceed with surgery.    PCP: Margaree Mackintosh, MD  D/C Plans:       Home  Post-op Meds:       No Rx given   Tranexamic Acid:      To be given - IV   Decadron:      Is to be given  FYI:     ASA  Norco    PMH: Past Medical History:  Diagnosis Date  . Arthritis   . Depression   . GERD (gastroesophageal reflux disease)   . Hyperlipidemia   . Hypothyroidism   . Palpitations   . Restless leg syndrome   . Retinitis pigmentosa     PSH: Past Surgical History:  Procedure Laterality Date  . EYE SURGERY     cataract extraction  . KNEE ARTHROPLASTY Left 08/1997   done [place] [facility] in [place]  . knee arthroplasty Left    x4 revision d/t infection and failed initial arthroplasty  . NECK SURGERY  2016    Social History:  reports that she has quit smoking. Her smoking use included Cigarettes. She quit after 28.00 years of use. She has never used smokeless tobacco. She reports that she does not drink alcohol or use  drugs.  Allergies:  Allergies  Allergen Reactions  . Benadryl [Diphenhydramine Hcl] Hives  . Septra [Sulfamethoxazole-Trimethoprim] Hives  . Other Rash    Metal    Medications: No current facility-administered medications for this encounter.    Current Outpatient Prescriptions  Medication Sig Dispense Refill  . Aspirin-Acetaminophen-Caffeine (GOODY HEADACHE PO) Take 1 packet by mouth daily as needed (headache).    . cetirizine (ZYRTEC) 10 MG tablet Take 20 mg by mouth at bedtime.    . citalopram (CELEXA) 40 MG tablet Take 40 mg by mouth every evening.    . furosemide (LASIX) 20 MG tablet Take 20 mg by mouth daily as needed for fluid or edema.    . gabapentin (NEURONTIN) 600 MG tablet Take 600 mg by mouth at bedtime.    Marland Kitchen levothyroxine (SYNTHROID, LEVOTHROID) 100 MCG tablet Take 50 mcg by mouth daily before breakfast. Takes 50 mcg with 56 mcg to equal 106 mcg    . levothyroxine (SYNTHROID, LEVOTHROID) 112 MCG tablet Take 56 mcg by mouth daily before breakfast. Takes 56 mcg with 50 mcg to equal 106 mcg    . pantoprazole (PROTONIX) 40 MG tablet Take 40 mg by mouth daily.    . propranolol (INDERAL) 40 MG  tablet Take 40 mg by mouth 2 (two) times daily.     . risperiDONE (RISPERDAL) 0.5 MG tablet Take 0.5 mg by mouth 2 (two) times daily.    Marland Kitchen. rOPINIRole (REQUIP) 2 MG tablet Take 4 mg by mouth 2 (two) times daily.        Review of Systems  Constitutional: Negative.   HENT: Negative.   Eyes: Negative.   Respiratory: Negative.   Cardiovascular: Negative.   Gastrointestinal: Positive for heartburn.  Genitourinary: Negative.   Musculoskeletal: Positive for joint pain.  Skin: Negative.   Neurological: Positive for headaches.  Endo/Heme/Allergies: Negative.   Psychiatric/Behavioral: Positive for depression.       Physical Exam  Constitutional: She is oriented to person, place, and time. She appears well-developed.  HENT:  Head: Normocephalic.  Eyes: Pupils are equal, round,  and reactive to light.  Neck: Neck supple. No JVD present. No tracheal deviation present. No thyromegaly present.  Cardiovascular: Normal rate, regular rhythm and intact distal pulses.   Respiratory: Effort normal and breath sounds normal. No respiratory distress. She has no wheezes.  GI: Soft. There is no tenderness. There is no guarding.  Musculoskeletal:       Left knee: She exhibits decreased range of motion, swelling, laceration (healed previous incision) and bony tenderness. She exhibits no ecchymosis, no deformity and no erythema. Tenderness found.  Lymphadenopathy:    She has no cervical adenopathy.  Neurological: She is alert and oriented to person, place, and time.  Skin: Skin is warm and dry.  Psychiatric: She has a normal mood and affect.       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. Risks benefits and expectations were discussed with the patient. Patient understand risks, benefits and expectations and wishes to proceed.   Anastasio AuerbachMatthew S. Kegan Mckeithan   PA-C  11/10/2016, 1:56 PM

## 2016-11-11 ENCOUNTER — Inpatient Hospital Stay (HOSPITAL_COMMUNITY): Payer: Medicare Other | Admitting: Anesthesiology

## 2016-11-11 ENCOUNTER — Encounter (HOSPITAL_COMMUNITY): Payer: Self-pay | Admitting: *Deleted

## 2016-11-11 ENCOUNTER — Inpatient Hospital Stay (HOSPITAL_COMMUNITY)
Admission: RE | Admit: 2016-11-11 | Discharge: 2016-11-14 | DRG: 465 | Disposition: A | Discharge status: Home Health | Payer: Medicare Other | Source: Ambulatory Visit | Attending: Orthopedic Surgery | Admitting: Orthopedic Surgery

## 2016-11-11 ENCOUNTER — Encounter (HOSPITAL_COMMUNITY)
Admission: RE | Disposition: A | Discharge status: Home Health | Payer: Self-pay | Source: Ambulatory Visit | Attending: Orthopedic Surgery

## 2016-11-11 DIAGNOSIS — T84033A Mechanical loosening of internal left knee prosthetic joint, initial encounter: Secondary | ICD-10-CM | POA: Diagnosis present

## 2016-11-11 DIAGNOSIS — Z89529 Acquired absence of unspecified knee: Secondary | ICD-10-CM | POA: Diagnosis not present

## 2016-11-11 DIAGNOSIS — E039 Hypothyroidism, unspecified: Secondary | ICD-10-CM | POA: Diagnosis present

## 2016-11-11 DIAGNOSIS — Z6830 Body mass index (BMI) 30.0-30.9, adult: Secondary | ICD-10-CM

## 2016-11-11 DIAGNOSIS — Z87891 Personal history of nicotine dependence: Secondary | ICD-10-CM

## 2016-11-11 DIAGNOSIS — E669 Obesity, unspecified: Secondary | ICD-10-CM | POA: Diagnosis present

## 2016-11-11 DIAGNOSIS — K219 Gastro-esophageal reflux disease without esophagitis: Secondary | ICD-10-CM | POA: Diagnosis present

## 2016-11-11 DIAGNOSIS — G2581 Restless legs syndrome: Secondary | ICD-10-CM | POA: Diagnosis present

## 2016-11-11 DIAGNOSIS — E785 Hyperlipidemia, unspecified: Secondary | ICD-10-CM | POA: Diagnosis present

## 2016-11-11 HISTORY — PX: EXCISIONAL TOTAL KNEE ARTHROPLASTY WITH ANTIBIOTIC SPACERS: SHX5827

## 2016-11-11 LAB — TYPE AND SCREEN
ABO/RH(D): B POS
ANTIBODY SCREEN: POSITIVE
DAT, IgG: POSITIVE

## 2016-11-11 SURGERY — REMOVAL, TOTAL ARTHROPLASTY HARDWARE, KNEE, WITH ANTIBIOTIC SPACER INSERTION
Anesthesia: General | Site: Knee | Laterality: Left

## 2016-11-11 MED ORDER — SUCCINYLCHOLINE CHLORIDE 200 MG/10ML IV SOSY
PREFILLED_SYRINGE | INTRAVENOUS | Status: AC
  Filled 2016-11-11: qty 10

## 2016-11-11 MED ORDER — HYDROCODONE-ACETAMINOPHEN 7.5-325 MG PO TABS
1.0000 | ORAL_TABLET | ORAL | Status: DC
  Administered 2016-11-11: 19:00:00 1 via ORAL
  Administered 2016-11-11: 23:00:00 2 via ORAL
  Administered 2016-11-12: 11:00:00 1 via ORAL
  Administered 2016-11-12: 23:00:00 2 via ORAL
  Administered 2016-11-12: 1 via ORAL
  Administered 2016-11-12 (×2): 2 via ORAL
  Administered 2016-11-12: 1 via ORAL
  Administered 2016-11-12: 18:00:00 2 via ORAL
  Administered 2016-11-13: 1 via ORAL
  Administered 2016-11-13 – 2016-11-14 (×9): 2 via ORAL
  Filled 2016-11-11 (×4): qty 2
  Filled 2016-11-11: qty 1
  Filled 2016-11-11 (×3): qty 2
  Filled 2016-11-11: qty 1
  Filled 2016-11-11 (×3): qty 2
  Filled 2016-11-11: qty 1
  Filled 2016-11-11: qty 2
  Filled 2016-11-11: qty 1
  Filled 2016-11-11 (×4): qty 2

## 2016-11-11 MED ORDER — CEFAZOLIN SODIUM-DEXTROSE 2-3 GM-% IV SOLR
2.0000 g | Freq: Once | INTRAVENOUS | Status: AC
  Administered 2016-11-11: 2 g via INTRAVENOUS

## 2016-11-11 MED ORDER — CEFAZOLIN SODIUM-DEXTROSE 2-4 GM/100ML-% IV SOLN
2.0000 g | Freq: Four times a day (QID) | INTRAVENOUS | Status: AC
  Administered 2016-11-11 (×2): 2 g via INTRAVENOUS
  Filled 2016-11-11 (×2): qty 100

## 2016-11-11 MED ORDER — LACTATED RINGERS IV SOLN
INTRAVENOUS | Status: DC
  Administered 2016-11-11 (×2): via INTRAVENOUS

## 2016-11-11 MED ORDER — MIDAZOLAM HCL 5 MG/ML IJ SOLN
1.0000 mg | INTRAMUSCULAR | Status: DC | PRN
  Administered 2016-11-11: 1 mg via INTRAVENOUS

## 2016-11-11 MED ORDER — RISPERIDONE 0.25 MG PO TABS
0.5000 mg | ORAL_TABLET | Freq: Two times a day (BID) | ORAL | Status: DC
  Administered 2016-11-11 – 2016-11-14 (×6): 0.5 mg via ORAL
  Filled 2016-11-11 (×6): qty 2

## 2016-11-11 MED ORDER — PROPOFOL 10 MG/ML IV BOLUS
INTRAVENOUS | Status: DC | PRN
  Administered 2016-11-11: 160 mg via INTRAVENOUS
  Administered 2016-11-11: 40 mg via INTRAVENOUS

## 2016-11-11 MED ORDER — FENTANYL CITRATE (PF) 100 MCG/2ML IJ SOLN
INTRAMUSCULAR | Status: DC | PRN
  Administered 2016-11-11 (×2): 50 ug via INTRAVENOUS

## 2016-11-11 MED ORDER — CEFAZOLIN SODIUM-DEXTROSE 2-4 GM/100ML-% IV SOLN
INTRAVENOUS | Status: AC
  Filled 2016-11-11: qty 100

## 2016-11-11 MED ORDER — ONDANSETRON HCL 4 MG/2ML IJ SOLN
INTRAMUSCULAR | Status: DC | PRN
  Administered 2016-11-11: 4 mg via INTRAVENOUS

## 2016-11-11 MED ORDER — METHOCARBAMOL 1000 MG/10ML IJ SOLN
500.0000 mg | Freq: Four times a day (QID) | INTRAMUSCULAR | Status: DC | PRN
  Administered 2016-11-11: 500 mg via INTRAVENOUS
  Filled 2016-11-11: qty 550
  Filled 2016-11-11: qty 5

## 2016-11-11 MED ORDER — MAGNESIUM CITRATE PO SOLN: 1.0000 | Freq: Once | ORAL | Status: DC | PRN

## 2016-11-11 MED ORDER — DEXAMETHASONE SODIUM PHOSPHATE 10 MG/ML IJ SOLN
INTRAMUSCULAR | Status: AC
  Filled 2016-11-11: qty 1

## 2016-11-11 MED ORDER — ROPINIROLE HCL 1 MG PO TABS
4.0000 mg | ORAL_TABLET | Freq: Two times a day (BID) | ORAL | Status: DC
  Administered 2016-11-11 – 2016-11-14 (×6): 4 mg via ORAL
  Filled 2016-11-11 (×5): qty 4

## 2016-11-11 MED ORDER — TOBRAMYCIN SULFATE 1.2 G IJ SOLR
INTRAMUSCULAR | Status: DC | PRN
  Administered 2016-11-11: 2.4 g

## 2016-11-11 MED ORDER — POLYETHYLENE GLYCOL 3350 17 G PO PACK
17.0000 g | PACK | Freq: Two times a day (BID) | ORAL | Status: DC
  Administered 2016-11-14: 17 g via ORAL
  Filled 2016-11-11 (×6): qty 1

## 2016-11-11 MED ORDER — LEVOTHYROXINE SODIUM 112 MCG PO TABS
56.0000 ug | ORAL_TABLET | Freq: Every day | ORAL | Status: DC
  Administered 2016-11-12 – 2016-11-14 (×3): 56 ug via ORAL
  Filled 2016-11-11 (×2): qty 0.5

## 2016-11-11 MED ORDER — METHOCARBAMOL 500 MG PO TABS
500.0000 mg | ORAL_TABLET | Freq: Four times a day (QID) | ORAL | Status: DC | PRN
  Administered 2016-11-12 – 2016-11-14 (×6): 500 mg via ORAL
  Filled 2016-11-11 (×6): qty 1

## 2016-11-11 MED ORDER — VANCOMYCIN HCL 1000 MG IV SOLR
INTRAVENOUS | Status: AC
  Filled 2016-11-11: qty 4000

## 2016-11-11 MED ORDER — SODIUM CHLORIDE 0.9 % IR SOLN
Status: DC | PRN
  Administered 2016-11-11: 1000 mL

## 2016-11-11 MED ORDER — ONDANSETRON HCL 4 MG/2ML IJ SOLN: 4.0000 mg | Freq: Four times a day (QID) | INTRAMUSCULAR | Status: DC | PRN

## 2016-11-11 MED ORDER — ONDANSETRON HCL 4 MG/2ML IJ SOLN
INTRAMUSCULAR | Status: AC
  Filled 2016-11-11: qty 2

## 2016-11-11 MED ORDER — FENTANYL CITRATE (PF) 100 MCG/2ML IJ SOLN
INTRAMUSCULAR | Status: AC
  Filled 2016-11-11: qty 2

## 2016-11-11 MED ORDER — ROCURONIUM BROMIDE 50 MG/5ML IV SOSY
PREFILLED_SYRINGE | INTRAVENOUS | Status: AC
  Filled 2016-11-11: qty 5

## 2016-11-11 MED ORDER — DOCUSATE SODIUM 100 MG PO CAPS
100.0000 mg | ORAL_CAPSULE | Freq: Two times a day (BID) | ORAL | Status: DC
  Administered 2016-11-11 – 2016-11-14 (×6): 100 mg via ORAL
  Filled 2016-11-11 (×6): qty 1

## 2016-11-11 MED ORDER — STERILE WATER FOR IRRIGATION IR SOLN
Status: DC | PRN
  Administered 2016-11-11: 2000 mL

## 2016-11-11 MED ORDER — KETOROLAC TROMETHAMINE 30 MG/ML IJ SOLN
INTRAMUSCULAR | Status: DC | PRN
  Administered 2016-11-11: 30 mg

## 2016-11-11 MED ORDER — SODIUM CHLORIDE 0.9 % IV SOLN
INTRAVENOUS | Status: DC
  Administered 2016-11-11 – 2016-11-12 (×2): via INTRAVENOUS

## 2016-11-11 MED ORDER — MENTHOL 3 MG MT LOZG
1.0000 | LOZENGE | OROMUCOSAL | Status: DC | PRN
  Filled 2016-11-11: qty 9

## 2016-11-11 MED ORDER — CHLORHEXIDINE GLUCONATE 4 % EX LIQD: 60.0000 mL | Freq: Once | CUTANEOUS | Status: DC

## 2016-11-11 MED ORDER — METOCLOPRAMIDE HCL 5 MG PO TABS
5.0000 mg | ORAL_TABLET | Freq: Three times a day (TID) | ORAL | Status: DC | PRN
  Administered 2016-11-11: 10 mg via ORAL
  Filled 2016-11-11: qty 2

## 2016-11-11 MED ORDER — EPHEDRINE SULFATE 50 MG/ML IJ SOLN
INTRAMUSCULAR | Status: DC | PRN
  Administered 2016-11-11: 10 mg via INTRAVENOUS

## 2016-11-11 MED ORDER — KETOROLAC TROMETHAMINE 30 MG/ML IJ SOLN
INTRAMUSCULAR | Status: AC
  Filled 2016-11-11: qty 1

## 2016-11-11 MED ORDER — DEXAMETHASONE SODIUM PHOSPHATE 10 MG/ML IJ SOLN
10.0000 mg | Freq: Once | INTRAMUSCULAR | Status: AC
  Administered 2016-11-12: 11:00:00 10 mg via INTRAVENOUS
  Filled 2016-11-11: qty 1

## 2016-11-11 MED ORDER — LEVOTHYROXINE SODIUM 50 MCG PO TABS
50.0000 ug | ORAL_TABLET | Freq: Every day | ORAL | Status: DC
  Administered 2016-11-12 – 2016-11-14 (×3): 50 ug via ORAL
  Filled 2016-11-11 (×3): qty 1

## 2016-11-11 MED ORDER — HYDROMORPHONE HCL 1 MG/ML IJ SOLN
INTRAMUSCULAR | Status: AC
  Administered 2016-11-11: 0.5 mg via INTRAVENOUS
  Filled 2016-11-11: qty 1

## 2016-11-11 MED ORDER — VANCOMYCIN HCL 1000 MG IV SOLR
INTRAVENOUS | Status: DC | PRN
  Administered 2016-11-11: 4 g

## 2016-11-11 MED ORDER — BUPIVACAINE HCL (PF) 0.25 % IJ SOLN
INTRAMUSCULAR | Status: AC
  Filled 2016-11-11: qty 30

## 2016-11-11 MED ORDER — MEPERIDINE HCL 50 MG/ML IJ SOLN: 6.2500 mg | INTRAMUSCULAR | Status: DC | PRN

## 2016-11-11 MED ORDER — HYDROMORPHONE HCL 1 MG/ML IJ SOLN
0.2500 mg | INTRAMUSCULAR | Status: DC | PRN
  Administered 2016-11-11 (×2): 0.5 mg via INTRAVENOUS

## 2016-11-11 MED ORDER — BUPIVACAINE HCL (PF) 0.25 % IJ SOLN
INTRAMUSCULAR | Status: DC | PRN
  Administered 2016-11-11: 30 mL

## 2016-11-11 MED ORDER — KETOROLAC TROMETHAMINE 30 MG/ML IJ SOLN: 30.0000 mg | Freq: Once | INTRAMUSCULAR | Status: DC

## 2016-11-11 MED ORDER — SODIUM CHLORIDE 0.9 % IJ SOLN
INTRAMUSCULAR | Status: AC
  Filled 2016-11-11: qty 50

## 2016-11-11 MED ORDER — DEXAMETHASONE SODIUM PHOSPHATE 10 MG/ML IJ SOLN
10.0000 mg | Freq: Once | INTRAMUSCULAR | Status: AC
  Administered 2016-11-11: 10 mg via INTRAVENOUS

## 2016-11-11 MED ORDER — BUPIVACAINE-EPINEPHRINE (PF) 0.5% -1:200000 IJ SOLN
INTRAMUSCULAR | Status: DC | PRN
  Administered 2016-11-11: 30 mL via PERINEURAL

## 2016-11-11 MED ORDER — CITALOPRAM HYDROBROMIDE 20 MG PO TABS
40.0000 mg | ORAL_TABLET | Freq: Every evening | ORAL | Status: DC
  Administered 2016-11-11 – 2016-11-13 (×3): 40 mg via ORAL
  Filled 2016-11-11 (×3): qty 2

## 2016-11-11 MED ORDER — MIDAZOLAM HCL 2 MG/2ML IJ SOLN
INTRAMUSCULAR | Status: AC
  Filled 2016-11-11: qty 2

## 2016-11-11 MED ORDER — SODIUM CHLORIDE 0.9 % IJ SOLN
INTRAMUSCULAR | Status: DC | PRN
  Administered 2016-11-11: 29 mL

## 2016-11-11 MED ORDER — ASPIRIN 81 MG PO CHEW
81.0000 mg | CHEWABLE_TABLET | Freq: Two times a day (BID) | ORAL | Status: DC
Start: 2016-11-11 — End: 2016-11-14
  Administered 2016-11-11 – 2016-11-14 (×6): 81 mg via ORAL
  Filled 2016-11-11 (×6): qty 1

## 2016-11-11 MED ORDER — LIDOCAINE 2% (20 MG/ML) 5 ML SYRINGE
INTRAMUSCULAR | Status: AC
  Filled 2016-11-11: qty 5

## 2016-11-11 MED ORDER — MIDAZOLAM HCL 5 MG/5ML IJ SOLN
INTRAMUSCULAR | Status: DC | PRN
  Administered 2016-11-11: 1 mg via INTRAVENOUS

## 2016-11-11 MED ORDER — PHENOL 1.4 % MT LIQD
1.0000 | OROMUCOSAL | Status: DC | PRN
  Filled 2016-11-11: qty 177

## 2016-11-11 MED ORDER — HYDROMORPHONE HCL 1 MG/ML IJ SOLN
0.5000 mg | INTRAMUSCULAR | Status: DC | PRN
  Administered 2016-11-11 – 2016-11-13 (×4): 1 mg via INTRAVENOUS
  Filled 2016-11-11 (×4): qty 1

## 2016-11-11 MED ORDER — FERROUS SULFATE 325 (65 FE) MG PO TABS
325.0000 mg | ORAL_TABLET | Freq: Three times a day (TID) | ORAL | Status: DC
  Administered 2016-11-11 – 2016-11-14 (×8): 325 mg via ORAL
  Filled 2016-11-11 (×8): qty 1

## 2016-11-11 MED ORDER — SODIUM CHLORIDE 0.9 % IR SOLN
Status: DC | PRN
  Administered 2016-11-11: 3000 mL

## 2016-11-11 MED ORDER — ONDANSETRON HCL 4 MG PO TABS: 4.0000 mg | ORAL_TABLET | Freq: Four times a day (QID) | ORAL | Status: DC | PRN

## 2016-11-11 MED ORDER — LORATADINE 10 MG PO TABS
10.0000 mg | ORAL_TABLET | Freq: Every day | ORAL | Status: DC
  Administered 2016-11-11 – 2016-11-14 (×4): 10 mg via ORAL
  Filled 2016-11-11 (×4): qty 1

## 2016-11-11 MED ORDER — FENTANYL CITRATE (PF) 100 MCG/2ML IJ SOLN
INTRAMUSCULAR | Status: AC
  Administered 2016-11-11: 100 ug via INTRAVENOUS
  Filled 2016-11-11: qty 2

## 2016-11-11 MED ORDER — PROPRANOLOL HCL 40 MG PO TABS
40.0000 mg | ORAL_TABLET | Freq: Two times a day (BID) | ORAL | Status: DC
  Administered 2016-11-11 – 2016-11-14 (×5): 40 mg via ORAL
  Filled 2016-11-11 (×6): qty 1

## 2016-11-11 MED ORDER — FENTANYL CITRATE (PF) 100 MCG/2ML IJ SOLN
50.0000 ug | INTRAMUSCULAR | Status: DC | PRN
  Administered 2016-11-11: 100 ug via INTRAVENOUS

## 2016-11-11 MED ORDER — PROMETHAZINE HCL 25 MG/ML IJ SOLN: 6.2500 mg | INTRAMUSCULAR | Status: DC | PRN

## 2016-11-11 MED ORDER — GABAPENTIN 300 MG PO CAPS
600.0000 mg | ORAL_CAPSULE | Freq: Every day | ORAL | Status: DC
  Administered 2016-11-11 – 2016-11-13 (×3): 600 mg via ORAL
  Filled 2016-11-11 (×3): qty 2

## 2016-11-11 MED ORDER — TOBRAMYCIN SULFATE 1.2 G IJ SOLR
INTRAMUSCULAR | Status: AC
  Filled 2016-11-11: qty 4.8

## 2016-11-11 MED ORDER — TRANEXAMIC ACID 1000 MG/10ML IV SOLN
1000.0000 mg | INTRAVENOUS | Status: AC
  Administered 2016-11-11: 1000 mg via INTRAVENOUS
  Filled 2016-11-11: qty 1100

## 2016-11-11 MED ORDER — PROPOFOL 10 MG/ML IV BOLUS
INTRAVENOUS | Status: AC
  Filled 2016-11-11: qty 40

## 2016-11-11 MED ORDER — HYDROXYZINE HCL 25 MG PO TABS: 25.0000 mg | ORAL_TABLET | Freq: Three times a day (TID) | ORAL | Status: DC | PRN

## 2016-11-11 MED ORDER — EPHEDRINE 5 MG/ML INJ
INTRAVENOUS | Status: AC
  Filled 2016-11-11: qty 10

## 2016-11-11 MED ORDER — METOCLOPRAMIDE HCL 5 MG/ML IJ SOLN: 5.0000 mg | Freq: Three times a day (TID) | INTRAMUSCULAR | Status: DC | PRN

## 2016-11-11 MED ORDER — BISACODYL 10 MG RE SUPP: 10.0000 mg | Freq: Every day | RECTAL | Status: DC | PRN

## 2016-11-11 MED ORDER — PANTOPRAZOLE SODIUM 40 MG PO TBEC
40.0000 mg | DELAYED_RELEASE_TABLET | Freq: Every day | ORAL | Status: DC
  Administered 2016-11-12 – 2016-11-14 (×3): 40 mg via ORAL
  Filled 2016-11-11 (×3): qty 1

## 2016-11-11 MED ORDER — FUROSEMIDE 20 MG PO TABS: 20.0000 mg | ORAL_TABLET | Freq: Every day | ORAL | Status: DC | PRN

## 2016-11-11 SURGICAL SUPPLY — 62 items
BAG SPEC THK2 15X12 ZIP CLS (MISCELLANEOUS) ×1
BAG ZIPLOCK 12X15 (MISCELLANEOUS) ×3 IMPLANT
BANDAGE ACE 6X5 VEL STRL LF (GAUZE/BANDAGES/DRESSINGS) ×3 IMPLANT
BANDAGE ESMARK 6X9 LF (GAUZE/BANDAGES/DRESSINGS) ×1 IMPLANT
BLADE SAW SGTL 11.0X1.19X90.0M (BLADE) IMPLANT
BLADE SAW SGTL 13.0X1.19X90.0M (BLADE) ×3 IMPLANT
BLADE SAW SGTL 81X20 HD (BLADE) ×3 IMPLANT
BNDG CMPR 9X6 STRL LF SNTH (GAUZE/BANDAGES/DRESSINGS) ×1
BNDG ESMARK 6X9 LF (GAUZE/BANDAGES/DRESSINGS) ×3
BOWL SMART MIX CTS (DISPOSABLE) ×6 IMPLANT
BRUSH FEMORAL CANAL (MISCELLANEOUS) ×3 IMPLANT
CEMENT HV SMART SET (Cement) ×18 IMPLANT
CUFF TOURN SGL QUICK 34 (TOURNIQUET CUFF) ×3
CUFF TRNQT CYL 34X4X40X1 (TOURNIQUET CUFF) ×1 IMPLANT
DRAPE EXTREMITY T 121X128X90 (DRAPE) ×3 IMPLANT
DRAPE POUCH INSTRU U-SHP 10X18 (DRAPES) ×3 IMPLANT
DRAPE U-SHAPE 47X51 STRL (DRAPES) ×3 IMPLANT
DRESSING AQUACEL AG SP 3.5X10 (GAUZE/BANDAGES/DRESSINGS) IMPLANT
DRSG AQUACEL AG SP 3.5X10 (GAUZE/BANDAGES/DRESSINGS)
DRSG MEPILEX BORDER 4X12 (GAUZE/BANDAGES/DRESSINGS) ×3 IMPLANT
DURAPREP 26ML APPLICATOR (WOUND CARE) ×6 IMPLANT
ELECT REM PT RETURN 15FT ADLT (MISCELLANEOUS) ×3 IMPLANT
FACESHIELD WRAPAROUND (MASK) ×15 IMPLANT
GLOVE BIOGEL PI IND STRL 7.0 (GLOVE) ×2 IMPLANT
GLOVE BIOGEL PI IND STRL 7.5 (GLOVE) ×3 IMPLANT
GLOVE BIOGEL PI IND STRL 8.5 (GLOVE) ×1 IMPLANT
GLOVE BIOGEL PI INDICATOR 7.0 (GLOVE) ×4
GLOVE BIOGEL PI INDICATOR 7.5 (GLOVE) ×6
GLOVE BIOGEL PI INDICATOR 8.5 (GLOVE) ×2
GLOVE ECLIPSE 8.0 STRL XLNG CF (GLOVE) ×9 IMPLANT
GLOVE ORTHO TXT STRL SZ7.5 (GLOVE) ×6 IMPLANT
GLOVE SURG SS PI 7.0 STRL IVOR (GLOVE) ×6 IMPLANT
GOWN STRL REUS W/TWL LRG LVL3 (GOWN DISPOSABLE) ×3 IMPLANT
GOWN STRL REUS W/TWL XL LVL3 (GOWN DISPOSABLE) ×9 IMPLANT
HANDPIECE INTERPULSE COAX TIP (DISPOSABLE) ×3
IMMOBILIZER KNEE 20 (SOFTGOODS) ×3 IMPLANT
MANIFOLD NEPTUNE II (INSTRUMENTS) ×3 IMPLANT
MARKER SKIN DUAL TIP RULER LAB (MISCELLANEOUS) ×3 IMPLANT
NDL SAFETY ECLIPSE 18X1.5 (NEEDLE) ×1 IMPLANT
NEEDLE HYPO 18GX1.5 SHARP (NEEDLE) ×2
POSITIONER SURGICAL ARM (MISCELLANEOUS) ×3 IMPLANT
SET HNDPC FAN SPRY TIP SCT (DISPOSABLE) ×1 IMPLANT
SET PAD KNEE POSITIONER (MISCELLANEOUS) ×3 IMPLANT
SPACER KASM MOLD44APX67ML KNEE (Spacer) ×3 IMPLANT
SPACER TIBIAL SM39*58 KNEE (Spacer) ×3 IMPLANT
SPONGE LAP 18X18 X RAY DECT (DISPOSABLE) ×3 IMPLANT
STAPLER VISISTAT 35W (STAPLE) ×3 IMPLANT
SUCTION FRAZIER HANDLE 12FR (TUBING) ×2
SUCTION TUBE FRAZIER 12FR DISP (TUBING) ×1 IMPLANT
SUT MNCRL AB 4-0 PS2 18 (SUTURE) IMPLANT
SUT STRATAFIX 0 PDS 27 VIOLET (SUTURE) ×6
SUT VIC AB 1 CT1 36 (SUTURE) ×9 IMPLANT
SUT VIC AB 2-0 CT1 27 (SUTURE) ×9
SUT VIC AB 2-0 CT1 TAPERPNT 27 (SUTURE) ×3 IMPLANT
SUTURE STRATFX 0 PDS 27 VIOLET (SUTURE) ×2 IMPLANT
SYR 50ML LL SCALE MARK (SYRINGE) ×3 IMPLANT
TOWEL OR 17X26 10 PK STRL BLUE (TOWEL DISPOSABLE) ×6 IMPLANT
TOWEL OR NON WOVEN STRL DISP B (DISPOSABLE) ×3 IMPLANT
TOWER CARTRIDGE SMART MIX (DISPOSABLE) ×3 IMPLANT
TRAY FOLEY CATH SILVER 14FR (SET/KITS/TRAYS/PACK) ×3 IMPLANT
WRAP KNEE MAXI GEL POST OP (GAUZE/BANDAGES/DRESSINGS) ×3 IMPLANT
YANKAUER SUCT BULB TIP 10FT TU (MISCELLANEOUS) ×3 IMPLANT

## 2016-11-11 NOTE — Anesthesia Preprocedure Evaluation (Addendum)
Anesthesia Evaluation  Patient identified by MRN, date of birth, ID band Patient awake    Reviewed: Allergy & Precautions, NPO status , Patient's Chart, lab work & pertinent test results  Airway Mallampati: II       Dental no notable dental hx.    Pulmonary former smoker,    Pulmonary exam normal        Cardiovascular negative cardio ROS Normal cardiovascular exam     Neuro/Psych    GI/Hepatic Neg liver ROS, GERD  Medicated and Controlled,  Endo/Other    Renal/GU      Musculoskeletal   Abdominal (+) + obese,   Peds  Hematology negative hematology ROS (+)   Anesthesia Other Findings   Reproductive/Obstetrics (+) Breast feeding                             Anesthesia Physical Anesthesia Plan  ASA: II  Anesthesia Plan: General   Post-op Pain Management:  Regional for Post-op pain   Induction: Intravenous  Airway Management Planned: LMA  Additional Equipment:   Intra-op Plan:   Post-operative Plan:   Informed Consent: I have reviewed the patients History and Physical, chart, labs and discussed the procedure including the risks, benefits and alternatives for the proposed anesthesia with the patient or authorized representative who has indicated his/her understanding and acceptance.     Plan Discussed with: CRNA and Surgeon  Anesthesia Plan Comments:        Anesthesia Quick Evaluation

## 2016-11-11 NOTE — Interval H&P Note (Signed)
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 After consideration of risks, benefits and other options for treatment, the patient has consented to  Procedure(s): Left total knee arthroplasty resection and placement of antibiotic spacer (Left) as a surgical intervention .  The patient's history has been reviewed, patient examined, no change in status, stable for surgery.  I have reviewed the patient's chart and labs.  Questions were answered to the patient's satisfaction.     Shelda PalLIN,Zacharey Jensen D

## 2016-11-11 NOTE — Anesthesia Postprocedure Evaluation (Signed)
Anesthesia Post Note  Patient: [name]  Procedure(s) Performed: Procedure(s) (LRB): Left total knee arthroplasty resection and placement of antibiotic spacer (Left)  Patient location during evaluation: PACU Anesthesia Type: General Level of consciousness: awake and alert Pain management: pain level controlled Vital Signs Assessment: post-procedure vital signs reviewed and stable Respiratory status: spontaneous breathing, nonlabored ventilation and respiratory function stable Cardiovascular status: blood pressure returned to baseline and stable Postop Assessment: no signs of nausea or vomiting Anesthetic complications: no       Last Vitals:  Vitals:   11/11/16 1212 11/11/16 1515  BP:  (!) (P) 159/78  Pulse: (!) 53   Resp: 15   Temp:  (P) 37 C    Last Pain:  Vitals:   11/11/16 1515  TempSrc:   PainSc: (P) Asleep                 Lowella CurbWarren Ray Denissa Cozart

## 2016-11-11 NOTE — Progress Notes (Signed)
Assiste Dr. Christean LeafHatchettwith left, ultrasound guided, adductor canal block. Side rails up, monitors on throughout procedure. See vital signs in flow sheet. Tolerated Procedure well.

## 2016-11-11 NOTE — Transfer of Care (Signed)
Immediate Anesthesia Transfer of Care Note  Patient: Jill RoachPatty H Clevenger  Procedure(s) Performed: Procedure(s) with comments: Left total knee arthroplasty resection and placement of antibiotic spacer (Left) - Adductor Block  Patient Location: PACU  Anesthesia Type:General  Level of Consciousness:  sedated, patient cooperative and responds to stimulation  Airway & Oxygen Therapy:Patient Spontanous Breathing and Patient connected to face mask oxgen  Post-op Assessment:  Report given to PACU RN and Post -op Vital signs reviewed and stable  Post vital signs:  Reviewed and stable  Last Vitals:  Vitals:   11/11/16 1211 11/11/16 1212  BP: 138/63   Pulse: (!) 53 (!) 53  Resp: 14 15  Temp:      Complications: No apparent anesthesia complications

## 2016-11-11 NOTE — Anesthesia Procedure Notes (Signed)
Procedure Name: LMA Insertion Date/Time: 11/11/2016 12:25 PM Performed by: Doran ClayALDAY, Jaymin Waln R Pre-anesthesia Checklist: Patient identified, Emergency Drugs available, Suction available, Patient being monitored and Timeout performed Patient Re-evaluated:Patient Re-evaluated prior to inductionOxygen Delivery Method: Circle system utilized Preoxygenation: Pre-oxygenation with 100% oxygen LMA: LMA flexible inserted LMA Size: 4.0 Number of attempts: 1 Placement Confirmation: positive ETCO2 and breath sounds checked- equal and bilateral Tube secured with: Tape Dental Injury: Teeth and Oropharynx as per pre-operative assessment

## 2016-11-11 NOTE — Brief Op Note (Signed)
11/11/2016  11:49 AM  PATIENT:  [name]  71 y.o. 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  POST-OPERATIVE DIAGNOSIS:  Failed left total knee replacement, aseptic loosening  PROCEDURE:  Procedure(s): Left total knee arthroplasty resection and placement of antibiotic spacer (Left)  SURGEON:  Surgeon(s) and Role:    * Durene RomansMatthew Sheyna Pettibone, MD - Primary  PHYSICIAN ASSISTANT: Lanney GinsMatthew Babish, PA-C  ANESTHESIA:   regional and general  EBL:  <200 cc  BLOOD ADMINISTERED:none  DRAINS: none   LOCAL MEDICATIONS USED:  MARCAINE     SPECIMEN:  Source of Specimen:  left knee  DISPOSITION OF SPECIMEN:  PATHOLOGY  COUNTS:  YES  TOURNIQUET:   90 min at 250 mmHg  DICTATION: .Other Dictation: Dictation Number [id number]  PLAN OF CARE: Admit to inpatient   PATIENT DISPOSITION:  PACU - hemodynamically stable.   Delay start of Pharmacological VTE agent (>24hrs) due to surgical blood loss or risk of bleeding: no

## 2016-11-11 NOTE — Anesthesia Procedure Notes (Addendum)
Anesthesia Regional Block: Adductor canal block   Pre-Anesthetic Checklist: ,, timeout performed, Correct Patient, Correct Site, Correct Laterality, Correct Procedure, Correct Position, site marked, Risks and benefits discussed,  Surgical consent,  Pre-op evaluation,  At surgeon's request and post-op pain management  Laterality: Left and Upper  Prep: chloraprep       Needles:  Injection technique: Single-shot  Needle Type: Echogenic Stimulator Needle     Needle Length: 9cm  Needle Gauge: 21   Needle insertion depth: 5 cm   Additional Needles:   Narrative:  Start time: 11/11/2016 11:35 AM End time: 11/11/2016 11:44 AM Injection made incrementally with aspirations every 5 mL.  Performed by: Personally  Anesthesiologist: Leilani AbleHATCHETT, Bernard Donahoo

## 2016-11-12 LAB — CBC
HCT: 31.6 % — ABNORMAL LOW (ref 36.0–46.0)
Hemoglobin: 10.3 g/dL — ABNORMAL LOW (ref 12.0–15.0)
MCH: 30.3 pg (ref 26.0–34.0)
MCHC: 32.6 g/dL (ref 30.0–36.0)
MCV: 92.9 fL (ref 78.0–100.0)
PLATELETS: 184 10*3/uL (ref 150–400)
RBC: 3.4 MIL/uL — ABNORMAL LOW (ref 3.87–5.11)
RDW: 14.5 % (ref 11.5–15.5)
WBC: 11.1 10*3/uL — AB (ref 4.0–10.5)

## 2016-11-12 LAB — BASIC METABOLIC PANEL
ANION GAP: 5 (ref 5–15)
BUN: 12 mg/dL (ref 6–20)
CALCIUM: 8.1 mg/dL — AB (ref 8.9–10.3)
CO2: 24 mmol/L (ref 22–32)
Chloride: 108 mmol/L (ref 101–111)
Creatinine, Ser: 1.07 mg/dL — ABNORMAL HIGH (ref 0.44–1.00)
GFR calc Af Amer: 59 mL/min — ABNORMAL LOW (ref >60)
GFR, EST NON AFRICAN AMERICAN: 51 mL/min — AB (ref >60)
GLUCOSE: 212 mg/dL — AB (ref 65–99)
Potassium: 5.2 mmol/L — ABNORMAL HIGH (ref 3.5–5.1)
Sodium: 137 mmol/L (ref 135–145)

## 2016-11-12 MED ORDER — SODIUM CHLORIDE 0.9% FLUSH: 10.0000 mL | INTRAVENOUS | Status: DC | PRN

## 2016-11-12 NOTE — Progress Notes (Signed)
   11/12/16 1400  PT Visit Information  Last PT Received On 11/12/16  Assistance Needed +1  History of Present Illness s/p L TKA resection with abx spacer (articulating);  PMHx; multiple knee surgeries, neck surgery  Subjective Data  Patient Stated Goal home soon  Precautions  Precautions Fall;Knee  Required Braces or Orthoses Knee Immobilizer - Left  Knee Immobilizer - Left Discontinue once straight leg raise with < 10 degree lag  Restrictions  LLE Weight Bearing TWB  Pain Assessment  Pain Assessment 0-10  Pain Score 6  Pain Location left knee  Pain Descriptors / Indicators Aching;Sore  Pain Intervention(s) Limited activity within patient's tolerance;Monitored during session;Premedicated before session  Cognition  Arousal/Alertness Awake/alert  Behavior [facility] for tasks assessed/performed  Overall Cognitive Status Within Functional Limits for tasks assessed  Bed Mobility  General bed mobility comments (pt declined OOB at this time)  Total Joint Exercises  Ankle Circles/Pumps AROM;Both;10 reps  Quad Sets Both;AROM;10 reps  Hip ABduction/ADduction AROM;Strengthening;Left  Straight Leg Raises AROM;Strengthening;Left;10 reps;AAROM  PT - End of Session  Equipment Utilized During Treatment Gait belt  Activity Tolerance Patient tolerated treatment well  Patient left in bed;with call bell/phone within reach;with bed alarm set;with family/visitor present  PT - Assessment/Plan  PT Plan Current plan remains appropriate  PT Visit Diagnosis Other abnormalities of gait and mobility (R26.89)  PT Frequency (ACUTE ONLY) Min 3X/week  Follow Up Recommendations No PT follow up (or HHPT--f/u per MD)  PT equipment None recommended by PT  AM-PAC PT "6 Clicks" Daily Activity Outcome Measure  Difficulty turning over in bed (including adjusting bedclothes, sheets and blankets)? 3  Difficulty moving from lying on back to sitting on the side of the bed?  3  Difficulty sitting down on and  standing up from a chair with arms (e.g., wheelchair, bedside commode, etc,.)? 3  Help needed moving to and from a bed to chair (including a wheelchair)? 3  Help needed walking in hospital room? 3  Help needed climbing 3-5 steps with a railing?  3  6 Click Score 18  Mobility G Code  CK  PT Goal Progression  Progress towards PT goals Progressing toward goals  Acute Rehab PT Goals  PT Goal Formulation With patient  Time For Goal Achievement 11/19/16  Potential to Achieve Goals Good  PT Time Calculation  PT Start Time (ACUTE ONLY) 1345  PT Stop Time (ACUTE ONLY) 1400  PT Time Calculation (min) (ACUTE ONLY) 15 min  PT General Charges  $$ ACUTE PT VISIT 1 Procedure  PT Treatments  $Therapeutic Exercise 8-22 mins  Drucilla Chaletara Colum Colt, PT Pager: [phone] 11/12/2016

## 2016-11-12 NOTE — Progress Notes (Signed)
Patient ID: [name], female   DOB: 29-Jan-1946, 71 y.o.   MRN: [id number] Subjective: 1 Day Post-Op Procedure(s) (LRB): Left total knee arthroplasty resection and placement of antibiotic spacer (Left)    Patient reports pain as moderate to severe.  No events reported  Objective:   VITALS:   Vitals:   11/12/16 0211 11/12/16 0537  BP: (!) 106/49 (!) 103/53  Pulse: 63 63  Resp: 16 16  Temp: 98 F (36.7 C) 98 F (36.7 C)    Neurovascular intact Incision: dressing C/D/I  LABS  Recent Labs  11/12/16 0436  HGB 10.3*  HCT 31.6*  WBC 11.1*  PLT 184     Recent Labs  11/12/16 0436  NA 137  K 5.2*  BUN 12  CREATININE 1.07*  GLUCOSE 212*    No results for input(s): LABPT, INR in the last 72 hours.   Assessment/Plan: 1 Day Post-Op Procedure(s) (LRB): Left total knee arthroplasty resection and placement of antibiotic spacer (Left)   Advance diet Up with therapy Plan for discharge tomorrow or Thursday depending on planning and procedures PIC line for 4-6 weeks IV antibiotics CT scan of left knee prior to discharge for surgical planning purposes to evaluate available bone stock

## 2016-11-12 NOTE — Care Management Note (Addendum)
Case Management Note  Patient Details  Name: [name] MRN: [id number] Date of Birth: 05/19/1946  Subjective/Objective:                  Left total knee arthroplasty resection and placement of antibiotic spacer (Left) Action/Plan: Discharge planning Expected Discharge Date:  11/14/16               Expected Discharge Plan:  [place]  In-House Referral:     Discharge planning Services  CM Consult  Post Acute Care Choice:  Home Health Choice offered to:  Patient  DME Arranged:  3-N-1, IV pump/equipment, Walker rolling DME Agency:  [place]:  RN, PT [facility] Agency:  [facility]  Status of Service:  Completed, signed off  If discussed at [employer], dates discussed:    Additional Comments: CM met with pt to offer choice of home health agency and pt chooses [facility] to render HHPT/RN for IV ABX management.  Referral for IV ABX called to Carolynn Sayers who is following.  Referral for disciplines of HHRN/PT called to [facility] rep, Tim.  CM has requested face to face, orders. Cm has notified [facility] DME rep, Jermaine for rolling walker and 3n1 to be delivered to room prior to discharge. No other CM needs were communicated. Dellie Catholic, RN 11/12/2016, 12:03 PM

## 2016-11-12 NOTE — Evaluation (Signed)
Physical Therapy Evaluation Patient Details Name: [name] MRN: [id number] DOB: 1946-04-18 Today's Date: 11/12/2016   History of Present Illness  s/p L TKA resection with abx spacer (articulating);  PMHx; multiple knee surgeries, neck surgery  Clinical Impression  Pt is s/p TKA resulting in the deficits listed below (see PT Problem List).  Pt will benefit from skilled PT to increase their independence and safety with mobility to allow discharge to the venue listed below.      Follow Up Recommendations No PT follow up (or per MD)    Equipment Recommendations  None recommended by PT    Recommendations for Other Services       Precautions / Restrictions Precautions Precautions: Fall;Knee Required Braces or Orthoses: Knee Immobilizer - Left Knee Immobilizer - Left: Discontinue once straight leg raise with < 10 degree lag Restrictions LLE Weight Bearing: Touchdown weight bearing      Mobility  Bed Mobility Overal bed mobility: Needs Assistance Bed Mobility: Supine to Sit     Supine to sit: Min guard     General bed mobility comments: for safety  Transfers Overall transfer level: Needs assistance Equipment used: Rolling walker (2 wheeled) Transfers: Sit to/from Stand Sit to Stand: Min guard Stand pivot transfers: Min guard       General transfer comment: verbal cues for hand placement and LLE Position  Ambulation/Gait Ambulation/Gait assistance: Min assist;Min guard Ambulation Distance (Feet): 15 Feet Assistive device: Rolling walker (2 wheeled)       General Gait Details: cues for RW position, pt maintains TDWB well  Stairs            Wheelchair Mobility    Modified Rankin (Stroke Patients Only)       Balance Overall balance assessment: Needs assistance   Sitting balance-Leahy Scale: Good       Standing balance-Leahy Scale: Fair Standing balance comment: briefly able to maintain static standing without UE support                              Pertinent Vitals/Pain Pain Assessment: 0-10 Pain Score: 3  Pain Location: left knee Pain Descriptors / Indicators: Aching;Sore Pain Intervention(s): Monitored during session;Repositioned;Ice applied    Home Living Family/patient expects to be discharged to:: Private residence Living Arrangements: Spouse/significant other   Type of Home: House Home Access: Stairs to enter   Secretary/administrator of Steps: 1 and 1  Home Layout: One level Home Equipment: Environmental consultant - 2 wheels      Prior Function Level of Independence: Independent               Hand Dominance        Extremity/Trunk Assessment   Upper Extremity Assessment Upper Extremity Assessment: (P) Defer to OT evaluation;Overall [facility] for tasks assessed    Lower Extremity Assessment Lower Extremity Assessment: LLE deficits/detail LLE Deficits / Details: ankle WFL; knee and hip grossly 3/5; knee flexion limited ~5-20* (articulating spacer)       Communication   Communication: No difficulties  Cognition Arousal/Alertness: Awake/alert Behavior During Therapy: WFL for tasks assessed/performed Overall Cognitive Status: Within Functional Limits for tasks assessed                                        General Comments      Exercises Total Joint Exercises Ankle Circles/Pumps: AROM;Both;10 reps  Quad Sets: Both;AROM;10 reps   Assessment/Plan    PT Assessment Patient needs continued PT services  PT Problem List Decreased strength;Decreased activity tolerance;Decreased mobility;Decreased knowledge of use of DME;Decreased range of motion;Pain       PT Treatment Interventions DME instruction;Gait training;Functional mobility training;Therapeutic activities;Therapeutic exercise;Stair training    PT Goals (Current goals can be found in the Care Plan section)  Acute Rehab PT Goals Patient Stated Goal: home soon PT Goal Formulation: With patient Time For Goal Achievement:  11/19/16 Potential to Achieve Goals: Good    Frequency Min 3X/week   Barriers to discharge        Co-evaluation               End of Session Equipment Utilized During Treatment: Gait belt Activity Tolerance: Patient tolerated treatment well Patient left: with call bell/phone within reach;in chair;with chair alarm set   PT Visit Diagnosis: Other abnormalities of gait and mobility (R26.89)    Time: [id number] PT Time Calculation (min) (ACUTE ONLY): 21 min   Charges:   PT Evaluation $PT Eval Low Complexity: 1 Procedure     PT G CodesDrucilla Boyer:        [name], PT Pager: [phone] 11/12/2016   [facility] Hospital IncWILLIAMS,[name] 11/12/2016, 1:12 PM

## 2016-11-12 NOTE — Progress Notes (Signed)
[facility]  New patient with [facility] this hospital admission.   [facility] will provide Home Infusion Pharmacy services for pt for home IV ABX upon DC home.  [facility] will partner with [facility] who will provide [facility] and other home health services as ordered.  Met with pt to provide overview of POC for home IV ABX.  Pt and husband have self administered IV ABX in the past and are comfortable with teaching to provide again.  [facility] will follow until DC to support transition home when ordered.  If patient discharges after hours, please call [phone].   Larry Sierras 11/12/2016, 12:47 PM

## 2016-11-12 NOTE — Op Note (Signed)
NAME:  [name], [name]                    ACCOUNT NO.:  MEDICAL RECORD NO.:  [id number]  LOCATION:                                 FACILITY:  PHYSICIAN:  Madlyn Frankel. Charlann Boxer, M.D.  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  PROCEDURES: 1. Resection of left total knee arthroplasty. 2. Placement of antibiotic articulating spacer.  SURGEON:  Madlyn Frankel. Charlann Boxer, M.D.  ASSISTANT:  Lanney Gins, PA-C.  Noted that, Mr. [name] was present for the entirety of the case from preoperative position, perioperative management of the operative extremity, general facilitation of the case and primary wound closure.  ANESTHESIA:  Preoperative regional adductor canal block plus general anesthetic.  SPECIMENS:  None taken as the joint fluid was normal in appearance.  DRAINS:  None.  TOURNIQUET TIME:  90 minutes at 250 mmHg.  INDICATIONS FOR PROCEDURE:  [name] is a 71 year old female with very complex orthopedic history.  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  Her last revision surgery was done was with a [name] and Nephew Oxinium- based revision components with a hybrid technique of cementing the articular surfaces with press-fit stems.  I had not seen [name] for some time.  She presented to the office recently with increasing pain in her left knee.  She did not have any fevers, chills, night sweats or any recent procedures.  Radiographs were not indicative of any obvious loosening or failure.  Sedimentation rate was 25 with a high normal of 15 and C-reactive protein was 6.9 with high normal of 5.  A bone scan had been ordered, which indicated uptake in all  three phases with concerns for loosening.  I had a lengthy discussion with [name] and her husband the current situation.  I discussed that based on the significant bone loss particularly in the tibia and the complications that we had removal of previous metal implants that any further operation on her would have become less and less predictable.  Despite this, she was somewhat insistent based on the amount of pain she was having that we proceed.  The predominant pain was in the mid leg area, which could have been consistent with some micromotion of the press-fit stem.  After having this lengthy discussion, we discussed options.  I felt at this point that what I would do was resect her knee, treated as an infection with antibiotic articulating spacer and IV antibiotics. They will need to plan on the strategy for reimplantation.  Due to the significant bone loss proximally, there was very little rotational support of the stem proximally and with the pain that she would experience with the press-fit stem or worry about what other options would be available.  This was all reviewed with her, consent was obtained for benefit of pain relief and moving towards the solution.  PROCEDURE IN DETAIL:  The patient was brought to  the operative theater. Once adequate anesthesia was established, she was positioned supine.  I elected to go ahead and give her 2 g of Ancef.  She also received 1 g of tranexamic acid and 10 mg of Decadron.  Left thigh tourniquet was placed.  The left lower extremity was then prepped and draped in sterile fashion using DeMayo leg holder.  Her arc of motion preoperatively was about 0-20.  Following the time-out, identifying the patient, planned procedure and extremity, the leg was exsanguinated and tourniquet elevated to 250 mmHg.  We utilized her old incision, excising the scar. Soft tissue planes were created.  I then made a median arthrotomy, at which point, we  encountered no pus or purulence, but clear synovial fluid.  I then did a proximal medial peel, but based on her limited range of motion, we went straight to quadriceps snip about 4-5 cm proximal to the patella.  I then softly lateralized the patella and an extensor mechanism for exposure.  I then began working on removing the components.  Though it required some effort and time, both the femoral and tibial components were removed without further bone loss or complication that were already present.  I debrided the canals with the curette.  I irrigated the canals with a pulse lavage with a canal brush irrigator.  Once the knee had been irrigated with 3 L of normal saline solution, we went ahead and opened up the DePuy KASM knee system with a medium femur and a small tibial tray.  At this point, we then mixed two batches of cement with 2 g of vancomycin.  This was to make the femoral mold and the tibial mold that we measured to be 15 mm.  I then mixed another two batches cement to help cement the femoral component and placed to void in the proximal femur.  This was done with 1 g of vancomycin and 1 of tobramycin.  At this point, I held the cement, the femoral mold on the distal femur until the cement cured.  At this time, the tibial cemented tray had cured as well and another two batches of cement were mixed.  This was done again with 1 g of vancomycin and 1 of tobramycin.  With this cement, I cemented the tibial tray into place and then brought the knee to extension to allow the cement to cure.  Excessive cement was removed.  Once the cement was fully cured, the knee was re-irrigated with another 1 L of normal saline solution.  At this point, I attempted to reapproximate the extensor mechanism using #1 Ethibond suture at the apex of the quadriceps snip, multiple #1 interrupted Vicryl and then supported this with an oversewing of a #1 Stratafix.  Once this was done, the rest of the  wound was closed with 2-0 Vicryl and staples on the skin.  The skin was cleaned, dried and dressed sterilely with a long Mepilex dressing and Ace wrap.  She will be in a knee immobilizer.  We will allow her to be touchdown weightbearing.  In the postoperative period, we will elect to get her a PICC line to treat her with IV antibiotics for 4-6 weeks.  In the interim, I will probably order a CT scan with 3D reconstructions to better evaluate the quality of the tibial bone to see what available options particularly in a metal allergy situation would be available. Findings were reviewed with family.     Madlyn FrankelMatthew D. Charlann Boxerlin, M.D.  MDO/MEDQ  D:  11/11/2016  T:  11/11/2016  Job:  [id number]

## 2016-11-12 NOTE — Evaluation (Signed)
Occupational Therapy Evaluation Patient Details Name: [name] MRN: [id number] DOB: [birth year] Today's Date: 11/12/2016    History of Present Illness s/p L TKA resection with abx spacer (articulating);    Clinical Impression   Pt is s/p TKA resection resulting in the deficits listed below (see OT Problem List).  Pt will benefit from skilled OT to increase their safety and independence with ADL and functional mobility for ADL to facilitate discharge to venue listed below.     Follow Up Recommendations  No OT follow up    Equipment Recommendations  None recommended by OT       Precautions / Restrictions Precautions Precautions: Fall;Knee Required Braces or Orthoses: Knee Immobilizer - Left Knee Immobilizer - Left: Discontinue once straight leg raise with < 10 degree lag Restrictions Weight Bearing Restrictions: Yes LLE Weight Bearing: Touchdown weight bearing      Mobility Bed Mobility               General bed mobility comments: pt in chair  Transfers Overall transfer level: Needs assistance Equipment used: Rolling walker (2 wheeled) Transfers: Sit to/from UGI CorporationStand;Stand Pivot Transfers Sit to Stand: Min assist Stand pivot transfers: Min assist       General transfer comment: VC for safety and WB status        ADL either performed or assessed with clinical judgement   ADL Overall ADL's : Needs assistance/impaired                     Lower Body Dressing: Moderate assistance;Sit to/from stand;Cueing for safety;Cueing for sequencing;Cueing for compensatory techniques   Toilet Transfer: Minimal assistance;RW;Cueing for sequencing;Cueing for safety Toilet Transfer Details (indicate cue type and reason): adhering to WB precautions Toileting- Clothing Manipulation and Hygiene: Minimal assistance;Sit to/from stand;Cueing for safety;Cueing for sequencing;Cueing for compensatory techniques       Functional mobility during ADLs: Minimal  assistance;Cueing for safety;Cueing for sequencing;Caregiver able to provide necessary level of assistance;Rolling walker General ADL Comments: Pts vision a challenge for performing activity in hospital as she is not familiar with surroundings. Pt doing well with VC and instruction.     Vision Baseline Vision/History: Legally blind        Perception   Praxis    Pertinent Vitals/Pain Pain Assessment: 0-10 Pain Score: 3  Pain Location: left knee Pain Descriptors / Indicators: Aching;Sore Pain Intervention(s): Monitored during session;Repositioned;Ice applied     Hand Dominance     Extremity/Trunk Assessment Upper Extremity Assessment Upper Extremity Assessment: Overall WFL for tasks assessed           Communication Communication Communication: No difficulties   Cognition Arousal/Alertness: Awake/alert Behavior During Therapy: WFL for tasks assessed/performed Overall Cognitive Status: Within Functional Limits for tasks assessed                                     General Comments       Exercises     Shoulder Instructions      Home Living Family/patient expects to be discharged to:: Private residence Living Arrangements: Spouse/significant other   Type of Home: House Home Access: Stairs to enter Entergy CorporationEntrance Stairs-Number of Steps: 1 and 1    Home Layout: One level     Bathroom Shower/Tub: Producer, television/film/videoWalk-in shower   Bathroom Toilet: Handicapped height (comfort ht)     Home Equipment: Environmental consultantWalker - 2 wheels  Prior Functioning/Environment Level of Independence: Independent                 OT Problem List: Decreased strength;Decreased activity tolerance;Pain      OT Treatment/Interventions: Self-care/ADL training;DME and/or AE instruction;Patient/family education    OT Goals(Current goals can be found in the care plan section) Acute Rehab OT Goals Patient Stated Goal: home soon OT Goal Formulation: With patient Time For Goal Achievement:  11/26/16 Potential to Achieve Goals: Good  OT Frequency: Min 2X/week   Barriers to D/C:            Co-evaluation              End of Session Equipment Utilized During Treatment: Engineer, water Communication: Mobility status  Activity Tolerance: Patient tolerated treatment well Patient left: in chair  OT Visit Diagnosis: Pain;Low vision, both eyes (H54.2);Other abnormalities of gait and mobility (R26.89) Pain - Right/Left: Right Pain - part of body: Knee                Time: 1610-9604 OT Time Calculation (min): 29 min Charges:  OT General Charges $OT Visit: 1 Procedure OT Evaluation $OT Eval Low Complexity: 1 Procedure OT Treatments $Self Care/Home Management : 8-22 mins G-Codes:     Lise Auer, OT [phone]  Einar Crow D 11/12/2016, 11:01 AM

## 2016-11-12 NOTE — Progress Notes (Signed)
Peripherally Inserted Central Catheter/Midline Placement  The IV Nurse has discussed with the patient and/or persons authorized to consent for the patient, the purpose of this procedure and the potential benefits and risks involved with this procedure.  The benefits include less needle sticks, lab draws from the catheter, and the patient may be discharged home with the catheter. Risks include, but not limited to, infection, bleeding, blood clot (thrombus formation), and puncture of an artery; nerve damage and irregular heartbeat and possibility to perform a PICC exchange if needed/ordered by physician.  Alternatives to this procedure were also discussed.  Bard Power PICC patient education guide, fact sheet on infection prevention and patient information card has been provided to patient /or left at bedside.    PICC/Midline Placement Documentation  PICC Single Lumen 11/12/16 PICC Right Brachial 38 cm 1 cm (Active)  Indication for Insertion or Continuance of Line Home intravenous therapies (PICC only) 11/12/2016  4:00 PM  Exposed Catheter (cm) 1 cm 11/12/2016  4:00 PM  Dressing Change Due 11/19/16 11/12/2016  4:00 PM       Reginia FortsLumban, Cristella Stiver Albarece 11/12/2016, 4:31 PM

## 2016-11-13 ENCOUNTER — Encounter (HOSPITAL_COMMUNITY): Payer: Self-pay | Admitting: Radiology

## 2016-11-13 ENCOUNTER — Inpatient Hospital Stay (HOSPITAL_COMMUNITY): Payer: Medicare Other

## 2016-11-13 LAB — BASIC METABOLIC PANEL
ANION GAP: 3 — AB (ref 5–15)
BUN: 14 mg/dL (ref 6–20)
CHLORIDE: 107 mmol/L (ref 101–111)
CO2: 28 mmol/L (ref 22–32)
Calcium: 8.4 mg/dL — ABNORMAL LOW (ref 8.9–10.3)
Creatinine, Ser: 0.96 mg/dL (ref 0.44–1.00)
GFR calc Af Amer: >60 mL/min (ref >60)
GFR, EST NON AFRICAN AMERICAN: 58 mL/min — AB (ref >60)
GLUCOSE: 181 mg/dL — AB (ref 65–99)
POTASSIUM: 4.3 mmol/L (ref 3.5–5.1)
Sodium: 138 mmol/L (ref 135–145)

## 2016-11-13 LAB — CBC
HEMATOCRIT: 28.5 % — AB (ref 36.0–46.0)
HEMOGLOBIN: 9.1 g/dL — AB (ref 12.0–15.0)
MCH: 30.1 pg (ref 26.0–34.0)
MCHC: 31.9 g/dL (ref 30.0–36.0)
MCV: 94.4 fL (ref 78.0–100.0)
Platelets: 166 10*3/uL (ref 150–400)
RBC: 3.02 MIL/uL — AB (ref 3.87–5.11)
RDW: 14.5 % (ref 11.5–15.5)
WBC: 9.8 10*3/uL (ref 4.0–10.5)

## 2016-11-13 MED ORDER — CEFAZOLIN IN D5W 1 GM/50ML IV SOLN
1.0000 g | Freq: Three times a day (TID) | INTRAVENOUS | Status: DC
  Administered 2016-11-13 – 2016-11-14 (×4): 1 g via INTRAVENOUS
  Filled 2016-11-13 (×6): qty 50

## 2016-11-13 MED ORDER — CEFAZOLIN IV (FOR PTA / DISCHARGE USE ONLY): 1.0000 g | Freq: Three times a day (TID) | INTRAVENOUS | 0 refills | Status: AC

## 2016-11-13 NOTE — Progress Notes (Signed)
[facility]  [facility].  [facility] is prepared for DC when ordered.  If patient discharges after hours, please call [phone].   [name] 11/13/2016, 12:08 PM

## 2016-11-13 NOTE — Progress Notes (Signed)
qPhysical Therapy Treatment Patient Details Name: [name] MRN: [id number] DOB: 03/04/1946 Today's Date: 11/13/2016    History of Present Illness s/p L TKA resection with abx spacer (articulating);  PMHx; multiple knee surgeries, neck surgery    PT Comments    Pt progressing, cautioned pt not to get up alone; RN made aware of pt LOB while going back to bed; pt to go for CT scan later today so will see again tomorrow prior to possible D/C; pt has ~2-3 steps and has done posterior stair technique in past   Follow Up Recommendations  No PT follow up;Home health PT (defer to MD)     Equipment Recommendations  None recommended by PT    Recommendations for Other Services       Precautions / Restrictions Precautions Precautions: Fall;Knee Required Braces or Orthoses: Knee Immobilizer - Left Knee Immobilizer - Left: Discontinue once straight leg raise with < 10 degree lag Restrictions Weight Bearing Restrictions: Yes LLE Weight Bearing: Touchdown weight bearing    Mobility  Bed Mobility Overal bed mobility: Needs Assistance Bed Mobility: Sit to Supine       Sit to supine: Min assist   General bed mobility comments: assist with LLE  Transfers Overall transfer level: Needs assistance Equipment used: Rolling walker (2 wheeled) Transfers: Sit to/from Stand Sit to Stand: Min guard;Min assist         General transfer comment: verbal cues for hand placement and LLE Position  Ambulation/Gait Ambulation/Gait assistance: Min assist;Min guard Ambulation Distance (Feet): 40 Feet Assistive device: Rolling walker (2 wheeled)       General Gait Details: cues for RW position, posture, pt maintains TDWB well   Stairs            Wheelchair Mobility    Modified Rankin (Stroke Patients Only)       Balance     Sitting balance-Leahy Scale: Good       Standing balance-Leahy Scale: Fair Standing balance comment: pt with LOB when she unexpectedly reached  towards the bed and sat onto R hip with support provided from PT to control descent onto bed;                             Cognition Arousal/Alertness: Awake/alert Behavior During Therapy: [facility] for tasks assessed/performed Overall Cognitive Status: Within Functional Limits for tasks assessed                                        Exercises Total Joint Exercises Ankle Circles/Pumps: AROM;Both;10 reps Quad Sets: Both;AROM;10 reps Hip ABduction/ADduction: AROM;Strengthening;Left Straight Leg Raises: AROM;Strengthening;Left;10 reps;AAROM    General Comments        Pertinent Vitals/Pain Pain Assessment: 0-10 Pain Score: 2  Pain Location: left knee Pain Descriptors / Indicators: Aching;Sore Pain Intervention(s): Limited activity within patient's tolerance;Monitored during session;Premedicated before session    Home Living                      Prior Function            PT Goals (current goals can now be found in the care plan section) Acute Rehab PT Goals Patient Stated Goal: home soon PT Goal Formulation: With patient Time For Goal Achievement: 11/19/16 Potential to Achieve Goals: Good Progress towards PT goals: Progressing toward goals    Frequency  7X/week      PT Plan Current plan remains appropriate    Co-evaluation             End of Session Equipment Utilized During Treatment: Gait belt;Left knee immobilizer Activity Tolerance: Patient tolerated treatment well Patient left: in bed;with call bell/phone within reach;with bed alarm set;with nursing/sitter in room   PT Visit Diagnosis: Other abnormalities of gait and mobility (R26.89)     Time: 1010-1025 PT Time Calculation (min) (ACUTE ONLY): 15 min  Charges:  $Gait Training: 8-22 mins                    G CodesDrucilla Chalet:       Riddick Nuon, PT Pager: [phone] 11/13/2016    Drucilla ChaletWILLIAMS,Olyn Landstrom 11/13/2016, 10:32 AM

## 2016-11-13 NOTE — Progress Notes (Signed)
PHARMACY CONSULT NOTE FOR:  OUTPATIENT  PARENTERAL ANTIBIOTIC THERAPY (OPAT)  Indication: Left TKR infection Regimen: ancef 1 gm IV q8h End date: 12/22/2016  IV antibiotic discharge orders are pended. To discharging provider:  please sign these orders via discharge navigator,  Select New Orders & click on the button choice - Manage This Unsigned Work.     Thank you for allowing pharmacy to be a part of this patient's care.  Lucia Gaskinsham, Janea Schwenn P 11/13/2016, 12:39 PM

## 2016-11-13 NOTE — Progress Notes (Signed)
     Subjective: 2 Days Post-Op Procedure(s) (LRB): Left total knee arthroplasty resection and placement of antibiotic spacer (Left)   Patient reports pain as mild, while sitting.  More pain is experienced with bearing weight.  No events throughout the night.  Dr. Charlann Boxerlin discussed Ancef for 6 weeks as well as getting a CT of the left knee with 3D reconstruction.  The scan is for evaluating the remaining bone stock, which the patient is in agreement.  Objective:   VITALS:   Vitals:   11/12/16 2257 11/13/16 0627  BP: (!) 133/46 (!) 117/53  Pulse: 65 65  Resp:    Temp:  98.4 F (36.9 C)    Dorsiflexion/Plantar flexion intact Incision: dressing C/D/I No cellulitis present Compartment soft  LABS  Recent Labs  11/12/16 0436 11/13/16 0405  HGB 10.3* 9.1*  HCT 31.6* 28.5*  WBC 11.1* 9.8  PLT 184 166     Recent Labs  11/12/16 0436 11/13/16 0405  NA 137 138  K 5.2* 4.3  BUN 12 14  CREATININE 1.07* 0.96  GLUCOSE 212* 181*     Assessment/Plan: 2 Days Post-Op Procedure(s) (LRB): Left total knee arthroplasty resection and placement of antibiotic spacer (Left) Up with therapy Discharge home with home health, eventually when ready (possibly tomorrow) Ordered CT scan of left knee with 3D reconstruction for evaluation of remaining bone stock. PICC line already placed. Order Ancef 1 gram q 8 hrs, plan on this for 6 weeks.    Anastasio AuerbachMatthew S. Rayhana Slider   PAC  11/13/2016, 8:15 AM

## 2016-11-13 NOTE — Progress Notes (Signed)
Occupational Therapy Treatment Patient Details Name: [name] H Haaland MRN: [id number] DOB: 17-Jan-1946 Today's Date: 11/13/2016    History of present illness s/p L TKA resection with abx spacer (articulating);  PMHx; multiple knee surgeries, neck surgery   OT comments  Pt doing well- pt did have one loss of balance with pulling up pants- educated on keeping one hand on walker and only pulling with 1 hand  Follow Up Recommendations  No OT follow up    Equipment Recommendations  None recommended by OT    Recommendations for Other Services      Precautions / Restrictions Precautions Precautions: Fall;Knee Precaution Comments: no bending L knee Required Braces or Orthoses: Knee Immobilizer - Left Knee Immobilizer - Left: Discontinue once straight leg raise with < 10 degree lag Restrictions Weight Bearing Restrictions: Yes LLE Weight Bearing: Touchdown weight bearing       Mobility Bed Mobility Overal bed mobility: Needs Assistance Bed Mobility: Sit to Supine     Supine to sit: Min guard Sit to supine: Min assist   General bed mobility comments: assist with LLE  Transfers Overall transfer level: Needs assistance Equipment used: Rolling walker (2 wheeled) Transfers: Sit to/from RaytheonStand;Stand Pivot Transfers Sit to Stand: Min guard Stand pivot transfers: Min guard       General transfer comment: verbal cues for hand placement and LLE Position    Balance Overall balance assessment: Needs assistance   Sitting balance-Leahy Scale: Good       Standing balance-Leahy Scale: Fair Standing balance comment: lost balance while pulling up pants- educated pt to use one hand at a time for clothing managment to maintain balance                           ADL either performed or assessed with clinical judgement   ADL Overall ADL's : Needs assistance/impaired     Grooming: Set up;Sitting   Upper Body Bathing: Set up;Sitting   Lower Body Bathing: Minimal  assistance;Sit to/from stand;Cueing for safety;Cueing for sequencing;Cueing for compensatory techniques   Upper Body Dressing : Set up;Sitting   Lower Body Dressing: Minimal assistance;Cueing for safety;Cueing for sequencing;Cueing for compensatory techniques   Toilet Transfer: RW;Cueing for sequencing;Cueing for safety;Min guard   Toileting- Clothing Manipulation and Hygiene: Sit to/from stand;Cueing for safety;Cueing for sequencing;Cueing for compensatory techniques;Min guard         General ADL Comments: pt agreed to don clothes today. VC for keeping straight to don pants. Pt states she will verbalize technique to husband     Vision Baseline Vision/History: Legally blind     Perception     Praxis      Cognition Arousal/Alertness: Awake/alert Behavior During Therapy: WFL for tasks assessed/performed Overall Cognitive Status: Within Functional Limits for tasks assessed                                               Pertinent Vitals/ Pain       Pain Assessment: 0-10 Pain Score: 3  Pain Location: left knee Pain Descriptors / Indicators: Aching;Sore Pain Intervention(s): Repositioned;Monitored during session         Frequency  Min 2X/week        Progress Toward Goals  OT Goals(current goals can now be found in the care plan section)  Progress towards OT goals: Progressing toward goals  Acute Rehab OT Goals Patient Stated Goal: home soon  Plan Discharge plan remains appropriate       End of Session Equipment Utilized During Treatment: Rolling walker  OT Visit Diagnosis: Pain;Low vision, both eyes (H54.2);Other abnormalities of gait and mobility (R26.89);Unsteadiness on feet (R26.81);Muscle weakness (generalized) (M62.81) Pain - Right/Left: Right Pain - part of body: Knee   Activity Tolerance Patient tolerated treatment well   Patient Left in chair   Nurse Communication Mobility status        Time: 1191-4782 OT Time Calculation  (min): 24 min  Charges: OT General Charges $OT Visit: 1 Procedure OT Treatments $Self Care/Home Management : 23-37 mins  [place], [place] [phone]   Einar Crow D 11/13/2016, 10:46 AM

## 2016-11-13 NOTE — Care Management Note (Addendum)
Case Management Note  Patient Details  Name: [name] MRN: [id number] Date of Birth: [birth year]  Subjective/Objective: 71 y.o. F s/p/ Resection L TKA with placement Abx Spacer. Has PICC and will discharge on  Ancef 1 Gram IV x 6 weeks managed by [facility]. Jeri ModenaPam Chandler, RN Infusion Specialist has been involved with the discharge planning and the facilitation of discharge needs.                   Action/Plan: CM will sign off for now but will be available should additional discharge needs arise or disposition change.    Expected Discharge Date:  11/14/16               Expected Discharge Plan:  Home w Home Health Services  In-House Referral:     Discharge planning Services  CM Consult  Post Acute Care Choice:  Home Health Choice offered to:  Patient  DME Arranged:  3-N-1, IV pump/equipment, Walker rolling DME Agency:  [facility]  [facility]  [facility]  Status of Service:  Completed, signed off  If discussed at [employer] of [employer] Meetings, dates discussed:    Additional Comments:  Yvone NeuCrutchfield, Kirk Basquez M, RN 11/13/2016, 4:20 PM

## 2016-11-14 MED ORDER — METHOCARBAMOL 500 MG PO TABS: 500.0000 mg | ORAL_TABLET | Freq: Four times a day (QID) | ORAL | 0 refills | Status: DC | PRN

## 2016-11-14 MED ORDER — HEPARIN SOD (PORK) LOCK FLUSH 100 UNIT/ML IV SOLN
250.0000 [IU] | INTRAVENOUS | Status: AC | PRN
  Administered 2016-11-14: 250 [IU]

## 2016-11-14 MED ORDER — ASPIRIN 81 MG PO CHEW: 81.0000 mg | CHEWABLE_TABLET | Freq: Two times a day (BID) | ORAL | 0 refills | Status: AC

## 2016-11-14 MED ORDER — HYDROCODONE-ACETAMINOPHEN 7.5-325 MG PO TABS: 1.0000 | ORAL_TABLET | ORAL | 0 refills | Status: DC | PRN

## 2016-11-14 NOTE — Progress Notes (Signed)
qPhysical Therapy Treatment Patient Details Name: Leighton Roachatty H Birks MRN: [id number] DOB: [birth year] Today's Date: 11/14/2016    History of Present Illness s/p L TKA resection with abx spacer (articulating);  PMHx; multiple knee surgeries, neck surgery    PT Comments    Pt is ready for DC.  Follow Up Recommendations  No PT follow up     Equipment Recommendations  None recommended by PT    Recommendations for Other Services       Precautions / Restrictions Precautions Precautions: Fall;Knee Precaution Comments: no bending L knee Knee Immobilizer - Left: Discontinue once straight leg raise with < 10 degree lag Restrictions Weight Bearing Restrictions: Yes LLE Weight Bearing: Touchdown weight bearing    Mobility  Bed Mobility   Bed Mobility: Sit to Supine       Sit to supine: Min assist   General bed mobility comments: assist with LLE  Transfers Overall transfer level: Needs assistance Equipment used: Rolling walker (2 wheeled) Transfers: Sit to/from Stand Sit to Stand: Min guard         General transfer comment: cues for safety  Ambulation/Gait Ambulation/Gait assistance: Min guard Ambulation Distance (Feet): 40 Feet Assistive device: Rolling walker (2 wheeled) Gait Pattern/deviations: Step-to pattern     General Gait Details: cues for RW position, posture, pt maintains TDWB well   Stairs Stairs: Yes       General stair comments: pt declined to practice  Wheelchair Mobility    Modified Rankin (Stroke Patients Only)       Balance                                            Cognition Arousal/Alertness: Awake/alert                                            Exercises      General Comments        Pertinent Vitals/Pain Pain Score: 4  Pain Location: left knee Pain Descriptors / Indicators: Aching;Sore Pain Intervention(s): Monitored during session;Premedicated before session    Home Living                       Prior Function            PT Goals (current goals can now be found in the care plan section) Progress towards PT goals: Progressing toward goals    Frequency           PT Plan Current plan remains appropriate    Co-evaluation             End of Session Equipment Utilized During Treatment: Left knee immobilizer Activity Tolerance: Patient tolerated treatment well Patient left: in chair Nurse Communication: Mobility status       Time: [id number] PT Time Calculation (min) (ACUTE ONLY): 22 min  Charges:  $Gait Training: 8-22 mins                    G CodesBlanchard Kelch:       Kresha Abelson PT [phone]    Rada HayHill, Zada Haser Elizabeth 11/14/2016, 12:19 PM

## 2016-11-14 NOTE — Progress Notes (Signed)
     Subjective: 3 Days Post-Op Procedure(s) (LRB): Left total knee arthroplasty resection and placement of antibiotic spacer (Left)   Patient reports pain as mild, pain controlled. She states that yesterday she slipped a little with the walker and put more weight down on the left leg, which caused more pain.  She states that the pain from the incident has resolved.  We have discussed that I'd rather her put more weight down than to fall and hurt herself otherwise, but if able limit the weight bearing as best as possible.  Ready to be discharged home.   Objective:   VITALS:   Vitals:   11/13/16 2257 11/14/16 0600  BP: (!) 107/48 (!) 105/46  Pulse: 66 65  Resp: 16 17  Temp:  98 F (36.7 C)    Dorsiflexion/Plantar flexion intact Incision: dressing C/D/I No cellulitis present Compartment soft  LABS  Recent Labs  11/12/16 0436 11/13/16 0405  HGB 10.3* 9.1*  HCT 31.6* 28.5*  WBC 11.1* 9.8  PLT 184 166     Recent Labs  11/12/16 0436 11/13/16 0405  NA 137 138  K 5.2* 4.3  BUN 12 14  CREATININE 1.07* 0.96  GLUCOSE 212* 181*     Assessment/Plan: 3 Days Post-Op Procedure(s) (LRB): Left total knee arthroplasty resection and placement of antibiotic spacer (Left) Up with therapy Discharge home with home health [facility]. Follow up with OLIN,Amadea Keagy D in 2 weeks.  Contact information:  [facility] [address], [address] [place] [id number] [phone]         Jill AuerbachMatthew S. Danuta Boyer   PAC  11/14/2016, 7:55 AM

## 2016-11-15 LAB — BPAM RBC
Blood Product Expiration Date: 201804202359
UNIT TYPE AND RH: 1700

## 2016-11-15 LAB — TYPE AND SCREEN
ABO/RH(D): B POS
ANTIBODY SCREEN: POSITIVE
DAT, IgG: POSITIVE
Donor AG Type: NEGATIVE
UNIT DIVISION: 0

## 2016-11-17 DIAGNOSIS — I10 Essential (primary) hypertension: Secondary | ICD-10-CM | POA: Diagnosis present

## 2016-11-17 DIAGNOSIS — I480 Paroxysmal atrial fibrillation: Secondary | ICD-10-CM | POA: Diagnosis present

## 2016-11-18 NOTE — Discharge Summary (Signed)
Physician Discharge Summary  Patient ID: [name] MRN: [id number] DOB/AGE: 08-21-1945 71 y.o.  Admit date: 11/11/2016 Discharge date: 11/14/2016   Procedures:  Procedure(s) (LRB): Left total knee arthroplasty resection and placement of antibiotic spacer (Left)  Attending Physician:  Dr. 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 Arthritis   . Depression   . GERD (gastroesophageal reflux disease)   . Hyperlipidemia   . Hypothyroidism   . Palpitations   . Restless leg syndrome   . Retinitis pigmentosa     HPI:    Pt is a 71 y.o. female complaining of left knee pain for the last couple of months. Pain had continually increased since the beginning.  History of 7 surgeries, the last being a revision about 9 yrs ago per the patient.  X-rays in the clinic show revision components in the left knee. Pt has tried various conservative treatments which have failed to alleviate their symptoms, including activity modification, NSAIDs / analgesic medications.   She rates the pain in the knee at 8.5-9 on a 10 point scale when it is at it's worst. States that she has more pain with activity and it really doesn't hurt at night or wake her up.  Various options are discussed with the patient. Risks, benefits and expectations were discussed with the patient. Patient understand the risks, benefits and expectations and wishes to proceed with surgery.   PCP: Dyann Ruddle, MD   Discharged Condition: good  Hospital Course:  Patient underwent the above stated procedure on 11/11/2016. Patient tolerated the procedure well and brought to the recovery room in good condition and subsequently to the floor.  POD #1 BP: 103/53 ; Pulse: 63 ; Temp: 98 F (36.7 C) ; Resp: 16 Patient reports pain as moderate to severe.  No events reported. Neurovascular  intact and incision: dressing C/D/I.  LABS  Basename    HGB     10.3  HCT     31.6   POD #2  BP: 117/53 ; Pulse: 65 ; Temp: 98.4 F (36.9 C) Patient reports pain as mild, while sitting.  More pain is experienced with bearing weight.  No events throughout the night.  Dr. Alvan Dame discussed Ancef for 6 weeks as well as getting a CT of the left knee with 3D reconstruction.  The scan is for evaluating the remaining bone stock, which the patient is in agreement. Dorsiflexion/Plantar flexion intact, incision: dressing C/D/I, no cellulitis present and compartment soft.  LABS  Basename    HGB     9.1  HCT     28.5   POD #3  BP: 105/46 ; Pulse: 65 ; Temp: 98 F (36.7 C) ; Resp: 17 Patient reports pain as mild, pain controlled. She states that yesterday she slipped a little with the walker and put more weight down on the left leg, which caused more pain.  She states that the pain from the incident has resolved.  We have discussed that I'd rather her put more weight down than to fall and hurt herself otherwise, but if able limit the weight bearing as best as possible.  Ready to be discharged home.  Dorsiflexion/Plantar flexion intact, incision: dressing C/D/I, no cellulitis present and compartment soft.  LABS   No new labs   Discharge Exam: General appearance: alert, cooperative and no distress Extremities:  Homans sign is negative, no sign of DVT, no edema, redness or tenderness in the calves or thighs and no ulcers, gangrene or trophic changes  Disposition: Home with follow up in 2 weeks   [place] Follow up.   Why:  IV antibiotic provider and also provider of rolling walkekr and 3n1 Contact information: [place] [address] [phone]        [place] Follow up.   Why:  HHRN will be provided by above and a representative will be in touch to arrange initial visit.  Contact information: [address] [place] [address] [phone]           Discharge Instructions    Call MD / Call 911    Complete by:  As directed    If you experience chest pain or shortness of breath, CALL 911 and be transported to the hospital emergency room.  If you develope a fever above 101 F, pus (white drainage) or increased drainage or redness at the wound, or calf pain, call your surgeon's office.   Change dressing    Complete by:  As directed    Maintain surgical dressing until follow up in the clinic. If the edges start to pull up, may reinforce with tape. If the dressing is no longer working, may remove and cover with gauze and tape, but must keep the area dry and clean.  Call with any questions or concerns.   Constipation Prevention    Complete by:  As directed    Drink plenty of fluids.  Prune juice may be helpful.  You may use a stool softener, such as Colace (over the counter) 100 mg twice a day.  Use MiraLax (over the counter) for constipation as needed.   Diet - low sodium heart healthy    Complete by:  As directed    Discharge instructions    Complete by:  As directed    Maintain surgical dressing until follow up in the clinic. If the edges start to pull up, may reinforce with tape. If the dressing is no longer working, may remove and cover with gauze and tape, but must keep the area dry and clean.  Follow up in 2 weeks at [facility]. Call with any questions or concerns.   Home infusion instructions [facility] May follow [place] Dosing Protocol; May administer Cathflo as needed to maintain patency of vascular access device.; Flushing of vascular access device: per [facility] Protocol: 0.9% NaCl pre/post medica...    Complete by:  As directed    Instructions:  May follow [place] Dosing Protocol   Instructions:  May administer Cathflo as needed to maintain patency of vascular access device.   Instructions:  Flushing of vascular access device: per [facility]  Protocol: 0.9% NaCl pre/post medication administration and prn patency; Heparin 100 u/ml, 18m for implanted ports and Heparin 10u/ml, 534mfor all other central venous catheters.   Instructions:  May follow [facility] Anaphylaxis Protocol for First Dose Administration in the home: 0.9% NaCl at 25-50 ml/hr to maintain IV access for protocol meds. Epinephrine 0.3 ml IV/IM PRN and Benadryl 25-50 IV/IM PRN s/s of anaphylaxis.   Instructions:  [facility] Coordinator (RN) to assist per patient IV care needs in the home PRN.   TED hose    Complete by:  As directed    Use stockings (TED hose) for 2 weeks  on both leg(s).  You may remove them at night for sleeping.   Touch down weight bearing    Complete by:  As directed    Laterality:  left   Extremity:  Lower      Allergies as of 11/14/2016      Reactions   Benadryl [diphenhydramine Hcl] Hives   Septra [sulfamethoxazole-trimethoprim] Hives   Other Rash   Metal      Medication List    STOP taking these medications   GOODY HEADACHE PO     TAKE these medications   aspirin 81 MG chewable tablet Chew 1 tablet (81 mg total) by mouth 2 (two) times daily. Take for 4 weeks.   ceFAZolin IVPB Commonly known as:  ANCEF Inject 1 g into the vein every 8 (eight) hours. Indication:  Left TKR infection Last Day of Therapy:  05/06/02018 Labs - Once weekly:  CBC/D and BMP, Labs - Every other week:  ESR and CRP Notes to patient:  Home regimen   cetirizine 10 MG tablet Commonly known as:  ZYRTEC Take 20 mg by mouth at bedtime.   citalopram 40 MG tablet Commonly known as:  CELEXA Take 40 mg by mouth every evening.   furosemide 20 MG tablet Commonly known as:  LASIX Take 20 mg by mouth daily as needed for fluid or edema.   gabapentin 600 MG tablet Commonly known as:  NEURONTIN Take 600 mg by mouth at bedtime.   HYDROcodone-acetaminophen 7.5-325 MG tablet Commonly known as:  NORCO Take 1-2 tablets by mouth every 4 (four) hours as needed  for moderate pain.   levothyroxine 100 MCG tablet Commonly known as:  SYNTHROID, LEVOTHROID Take 50 mcg by mouth daily before breakfast. Takes 50 mcg with 56 mcg to equal 106 mcg   levothyroxine 112 MCG tablet Commonly known as:  SYNTHROID, LEVOTHROID Take 56 mcg by mouth daily before breakfast. Takes 56 mcg with 50 mcg to equal 106 mcg   methocarbamol 500 MG tablet Commonly known as:  ROBAXIN Take 1 tablet (500 mg total) by mouth every 6 (six) hours as needed for muscle spasms.   pantoprazole 40 MG tablet Commonly known as:  PROTONIX Take 40 mg by mouth daily.   propranolol 40 MG tablet Commonly known as:  INDERAL Take 40 mg by mouth 2 (two) times daily.   risperiDONE 0.5 MG tablet Commonly known as:  RISPERDAL Take 0.5 mg by mouth 2 (two) times daily.   rOPINIRole 2 MG tablet Commonly known as:  REQUIP Take 4 mg by mouth 2 (two) times daily.            Home Infusion Instuctions        Start     Ordered   11/13/16 0000  Home infusion instructions [facility] May follow [place] Dosing Protocol; May administer Cathflo as needed to maintain patency of vascular access device.; Flushing of vascular access device: per [facility] Protocol: 0.9% NaCl pre/post medica...    Question Answer Comment  Instructions May follow [place] Dosing Protocol   Instructions May administer Cathflo as needed to maintain patency of vascular access device.   Instructions Flushing of vascular access device: per [facility] Protocol: 0.9% NaCl pre/post medication administration and prn patency; Heparin 100 u/ml, 49m for implanted ports and Heparin 10u/ml, 542mfor all other central venous catheters.   Instructions May follow [facility] Anaphylaxis Protocol for First Dose Administration in the home: 0.9% NaCl at 25-50 ml/hr to maintain IV access for protocol meds.  Epinephrine 0.3 ml IV/IM PRN and Benadryl 25-50 IV/IM PRN s/s of anaphylaxis.   Instructions [facility] Infusion Coordinator (RN) to  assist per patient IV care needs in the home PRN.      11/13/16 1301       Signed: [place] Pugh. Ansar Skoda   PA-C  11/18/2016, 12:46 PM

## 2017-02-06 ENCOUNTER — Other Ambulatory Visit (HOSPITAL_COMMUNITY): Payer: Self-pay | Admitting: Orthopedic Surgery

## 2017-02-06 ENCOUNTER — Ambulatory Visit (HOSPITAL_COMMUNITY): Admission: RE | Admit: 2017-02-06 | Payer: Medicare Other | Source: Ambulatory Visit

## 2017-02-06 DIAGNOSIS — M7989 Other specified soft tissue disorders: Principal | ICD-10-CM

## 2017-02-06 DIAGNOSIS — M79605 Pain in left leg: Secondary | ICD-10-CM

## 2017-02-21 NOTE — Progress Notes (Signed)
Please place orders in EPIC as patient is being scheduled for a pre-op appointment! Thank you! 

## 2017-03-06 NOTE — H&P (Signed)
TOTAL KNEE REVISION ADMISSION H&P  Patient is being admitted for reimplantation of left total knee arthroplasty.  Subjective:  Chief Complaint:  Failed left TKA  HPI: Jill RoachPatty H Boyer, 71 y.o. female, has a history of pain and functional disability in the left knee(s) due to failed previous arthroplasty and patient has failed non-surgical conservative treatments for greater than 12 weeks to include NSAID's and/or analgesics, use of assistive devices, weight reduction as appropriate, activity modification and antibiotics. The indications for the revision of the total knee arthroplasty are failure / infection.   Prior procedures on the left knee(s) include arthroplasty and resection and placement of antibiotic spacer.  Patient currently rates pain in the left knee(s) at 7 out of 10 with activity. There is night pain, worsening of pain with activity and weight bearing, pain that interferes with activities of daily living and pain with passive range of motion.   This condition presents safety issues increasing the risk of falls. There is no current active infection.  Risks, benefits and expectations were discussed with the patient.  Risks including but not limited to the risk of anesthesia, blood clots, nerve damage, blood vessel damage, failure of the prosthesis, infection and up to and including death.  Patient understand the risks, benefits and expectations and wishes to proceed with surgery.   PCP: Margaree MackintoshMcKinney, John, MD  D/C Plans:       Home   Post-op Meds:       No Rx given  Tranexamic Acid:      To be given - IV    Decadron:      Is to be given  FYI:     ASA  Norco  DME:   Pt already has equipment   PT:    [facility]   Patient Active Problem List   Diagnosis Date Noted  . Left TKA resection, spacer 11/11/2016   Past Medical History:  Diagnosis Date  . Arthritis   . Depression   . GERD (gastroesophageal reflux disease)   . Hyperlipidemia   . Hypothyroidism   . Palpitations   . Restless  leg syndrome   . Retinitis pigmentosa     Past Surgical History:  Procedure Laterality Date  . EXCISIONAL TOTAL KNEE ARTHROPLASTY WITH ANTIBIOTIC SPACERS Left 11/11/2016   Procedure: Left total knee arthroplasty resection and placement of antibiotic spacer;  Surgeon: Durene RomansMatthew Olin, MD;  Location: [facility];  Service: Orthopedics;  Laterality: Left;  Adductor Block  . EYE SURGERY     cataract extraction  . KNEE ARTHROPLASTY Left 08/1997   [place]  . knee arthroplasty Left    x4 revision d/t infection and failed initial arthroplasty  . NECK SURGERY  2016    No prescriptions prior to admission.   Allergies  Allergen Reactions  . Benadryl [Diphenhydramine Hcl] Hives  . Septra [Sulfamethoxazole-Trimethoprim] Hives  . Other Rash    Metal    Social History  Substance Use Topics  . Smoking status: Former Smoker    Years: 28.00    Types: Cigarettes  . Smokeless tobacco: Never Used     Comment: quit 30 years ago  . Alcohol use No       Review of Systems  Constitutional: Negative.   HENT: Negative.   Eyes: Negative.   Respiratory: Negative.   Cardiovascular: Negative.   Gastrointestinal: Positive for heartburn.  Genitourinary: Negative.   Musculoskeletal: Positive for joint pain.  Skin: Negative.   Neurological: Negative.   Endo/Heme/Allergies: Negative.  Psychiatric/Behavioral: Positive for depression.     Objective:  Physical Exam  Constitutional: She is oriented to person, place, and time. She appears well-developed.  HENT:  Head: Normocephalic.  Eyes: Pupils are equal, round, and reactive to light.  Neck: Neck supple. No JVD present. No tracheal deviation present. No thyromegaly present.  Cardiovascular: Normal rate, regular rhythm and intact distal pulses.   Respiratory: Effort normal and breath sounds normal. No respiratory distress. She has no wheezes.  GI: Soft. There is no tenderness. There is no guarding.  Musculoskeletal:       Left  knee: She exhibits decreased range of motion, laceration (healed previous incision), abnormal alignment and bony tenderness. She exhibits no ecchymosis, no deformity and no erythema. Tenderness found.  Lymphadenopathy:    She has no cervical adenopathy.  Neurological: She is alert and oriented to person, place, and time.  Skin: Skin is warm and dry.  Psychiatric: She has a normal mood and affect.      Labs:  Estimated body mass index is 30.82 kg/m as calculated from the following:   Height as of 11/11/16: 5\' 3"  (1.6 m).   Weight as of 11/11/16: 78.9 kg (174 lb).  Imaging Review Plain radiographs demonstrate spacer of the left knee(s).  The bone quality appears to be fair for age and reported activity level. There is an antibiotic spacer in place.  Assessment/Plan:  Left knee with failed previous arthroplasty / infection.   The patient history, physical examination, clinical judgment of the provider and imaging studies are consistent with failure of previous TKA / infected  of the left knee(s), previous total knee arthroplasty. Reimplantation of the left total knee arthroplasty is deemed medically necessary.  The risks and benefits of revision total knee arthroplasty were presented and reviewed. The risks due to aseptic loosening, infection, stiffness, patella tracking problems, thromboembolic complications and other imponderables were discussed. The patient acknowledged the explanation, agreed to proceed with the plan and consent was signed. Patient is being admitted for inpatient treatment for surgery, pain control, PT, OT, prophylactic antibiotics, VTE prophylaxis, progressive ambulation and ADL's and discharge planning.The patient is planning to be discharged home with home health services.     Anastasio Auerbach Jill Grayer   PA-C  03/06/2017, 1:39 PM

## 2017-03-10 NOTE — Progress Notes (Signed)
lov 01/21/17 12/10/16 ekg ST rate 123 Stress 05/19/14   all on chart

## 2017-03-10 NOTE — Patient Instructions (Addendum)
Leighton Roachatty H Sankey  03/10/2017   Your procedure is scheduled on: 03/17/17   [facility]                Follow signs to Short Stay on first floor at    515 AM   Call this number if you have problems the morning of surgery   [phone]   Remember: ONLY 1 PERSON MAY GO WITH YOU TO SHORT STAY TO GET  READY MORNING OF YOUR SURGERY.  Do not eat food or drink liquids :After Midnight.     Take these medicines the morning of surgery with A SIP OF WATER: requip,            risperdal,protonix,metoprolol,levothyroxine                               You may not have any metal on your body including hair pins and              piercings  Do not wear jewelry, make-up, lotions, powders or perfumes, deodorant             Do not wear nail polish.  Do not shave  48 hours prior to surgery.              Do not bring valuables to the hospital. [facility] IS NOT             RESPONSIBLE   FOR VALUABLES.  Contacts, dentures or bridgework may not be worn into surgery.  Leave suitcase in the car. After surgery it may be brought to your room.               Please read over the following fact sheets you were given: _____________________________________________________________________           [facility] Before surgery, you can play an important role.  Because skin is not sterile, your skin needs to be as free of germs as possible.  You can reduce the number of germs on your skin by washing with CHG (chlorahexidine gluconate) soap before surgery.  CHG is an antiseptic cleaner which kills germs and bonds with the skin to continue killing germs even after washing. Please DO NOT use if you have an allergy to CHG or antibacterial soaps.  If your skin becomes reddened/irritated stop using the CHG and inform your nurse when you arrive at Short Stay. Do not shave (including legs and underarms) for at least 48 hours prior to the first CHG shower.   You may shave your face/neck. Please follow these instructions carefully:  1.  Shower with CHG Soap the night before surgery and the  morning of Surgery.  2.  If you choose to wash your hair, wash your hair first as usual with your  normal  shampoo.  3.  After you shampoo, rinse your hair and body thoroughly to remove the  shampoo.                           4.  Use CHG as you would any other liquid soap.  You can apply chg directly  to the skin and wash  Gently with a scrungie or clean washcloth.  5.  Apply the CHG Soap to your body ONLY FROM THE NECK DOWN.   Do not use on face/ open                           Wound or open sores. Avoid contact with eyes, ears mouth and genitals (private parts).                       Wash face,  Genitals (private parts) with your normal soap.             6.  Wash thoroughly, paying special attention to the area where your surgery  will be performed.  7.  Thoroughly rinse your body with warm water from the neck down.  8.  DO NOT shower/wash with your normal soap after using and rinsing off  the CHG Soap.                9.  Pat yourself dry with a clean towel.            10.  Wear clean pajamas.            11.  Place clean sheets on your bed the night of your first shower and do not  sleep with pets. Day of Surgery : Do not apply any lotions/deodorants the morning of surgery.  Please wear clean clothes to the hospital/surgery center.  FAILURE TO FOLLOW THESE INSTRUCTIONS MAY RESULT IN THE CANCELLATION OF YOUR SURGERY PATIENT SIGNATURE_________________________________  NURSE SIGNATURE__________________________________  ________________________________________________________________________  WHAT IS A BLOOD TRANSFUSION? Blood Transfusion Information  A transfusion is the replacement of blood or some of its parts. Blood is made up of multiple cells which provide different functions.  Red blood cells carry oxygen and are used for blood  loss replacement.  White blood cells fight against infection.  Platelets control bleeding.  Plasma helps clot blood.  Other blood products are available for specialized needs, such as hemophilia or other clotting disorders. BEFORE THE TRANSFUSION  Who gives blood for transfusions?   Healthy volunteers who are fully evaluated to make sure their blood is safe. This is blood bank blood. Transfusion therapy is the safest it has ever been in the practice of medicine. Before blood is taken from a donor, a complete history is taken to make sure that person has no history of diseases nor engages in risky social behavior (examples are intravenous drug use or sexual activity with multiple partners). The donor's travel history is screened to minimize risk of transmitting infections, such as malaria. The donated blood is tested for signs of infectious diseases, such as HIV and hepatitis. The blood is then tested to be sure it is compatible with you in order to minimize the chance of a transfusion reaction. If you or a relative donates blood, this is often done in anticipation of surgery and is not appropriate for emergency situations. It takes many days to process the donated blood. RISKS AND COMPLICATIONS Although transfusion therapy is very safe and saves many lives, the main dangers of transfusion include:   Getting an infectious disease.  Developing a transfusion reaction. This is an allergic reaction to something in the blood you were given. Every precaution is taken to prevent this. The decision to have a blood transfusion has been considered carefully by your caregiver before blood is given. Blood is not given unless the benefits outweigh  the risks. AFTER THE TRANSFUSION  Right after receiving a blood transfusion, you will usually feel much better and more energetic. This is especially true if your red blood cells have gotten low (anemic). The transfusion raises the level of the red blood cells  which carry oxygen, and this usually causes an energy increase.  The nurse administering the transfusion will monitor you carefully for complications. HOME CARE INSTRUCTIONS  No special instructions are needed after a transfusion. You may find your energy is better. Speak with your caregiver about any limitations on activity for underlying diseases you may have. SEEK MEDICAL CARE IF:   Your condition is not improving after your transfusion.  You develop redness or irritation at the intravenous (IV) site. SEEK IMMEDIATE MEDICAL CARE IF:  Any of the following symptoms occur over the next 12 hours:  Shaking chills.  You have a temperature by mouth above 102 F (38.9 C), not controlled by medicine.  Chest, back, or muscle pain.  People around you feel you are not acting correctly or are confused.  Shortness of breath or difficulty breathing.  Dizziness and fainting.  You get a rash or develop hives.  You have a decrease in urine output.  Your urine turns a dark color or changes to pink, red, or brown. Any of the following symptoms occur over the next 10 days:  You have a temperature by mouth above 102 F (38.9 C), not controlled by medicine.  Shortness of breath.  Weakness after normal activity.  The white part of the eye turns yellow (jaundice).  You have a decrease in the amount of urine or are urinating less often.  Your urine turns a dark color or changes to pink, red, or brown. Document Released: 08/02/2000 Document Revised: 10/28/2011 Document Reviewed: 03/21/2008 ExitCare Patient Information 2014 [place], [place].  _______________________________________________________________________  Incentive Spirometer  An incentive spirometer is a tool that can help keep your lungs clear and active. This tool measures how well you are filling your lungs with each breath. Taking long deep breaths may help reverse or decrease the chance of developing breathing (pulmonary)  problems (especially infection) following:  A long period of time when you are unable to move or be active. BEFORE THE PROCEDURE   If the spirometer includes an indicator to show your best effort, your nurse or respiratory therapist will set it to a desired goal.  If possible, sit up straight or lean slightly forward. Try not to slouch.  Hold the incentive spirometer in an upright position. INSTRUCTIONS FOR USE  1. Sit on the edge of your bed if possible, or sit up as far as you can in bed or on a chair. 2. Hold the incentive spirometer in an upright position. 3. Breathe out normally. 4. Place the mouthpiece in your mouth and seal your lips tightly around it. 5. Breathe in slowly and as deeply as possible, raising the piston or the ball toward the top of the column. 6. Hold your breath for 3-5 seconds or for as long as possible. Allow the piston or ball to fall to the bottom of the column. 7. Remove the mouthpiece from your mouth and breathe out normally. 8. Rest for a few seconds and repeat Steps 1 through 7 at least 10 times every 1-2 hours when you are awake. Take your time and take a few normal breaths between deep breaths. 9. The spirometer may include an indicator to show your best effort. Use the indicator as a goal to work  toward during each repetition. 10. After each set of 10 deep breaths, practice coughing to be sure your lungs are clear. If you have an incision (the cut made at the time of surgery), support your incision when coughing by placing a pillow or rolled up towels firmly against it. Once you are able to get out of bed, walk around indoors and cough well. You may stop using the incentive spirometer when instructed by your caregiver.  RISKS AND COMPLICATIONS  Take your time so you do not get dizzy or light-headed.  If you are in pain, you may need to take or ask for pain medication before doing incentive spirometry. It is harder to take a deep breath if you are having  pain. AFTER USE  Rest and breathe slowly and easily.  It can be helpful to keep track of a log of your progress. Your caregiver can provide you with a simple table to help with this. If you are using the spirometer at home, follow these instructions: [place] IF:   You are having difficultly using the spirometer.  You have trouble using the spirometer as often as instructed.  Your pain medication is not giving enough relief while using the spirometer.  You develop fever of 100.5 F (38.1 C) or higher. SEEK IMMEDIATE MEDICAL CARE IF:   You cough up bloody sputum that had not been present before.  You develop fever of 102 F (38.9 C) or greater.  You develop worsening pain at or near the incision site. MAKE SURE YOU:   Understand these instructions.  Will watch your condition.  Will get help right away if you are not doing well or get worse. Document Released: 12/16/2006 Document Revised: 10/28/2011 Document Reviewed: 02/16/2007 [facility] Patient Information 2014 [place], [place].   ________________________________________________________________________

## 2017-03-11 ENCOUNTER — Encounter (HOSPITAL_COMMUNITY): Payer: Self-pay

## 2017-03-11 ENCOUNTER — Encounter (HOSPITAL_COMMUNITY)
Admission: RE | Admit: 2017-03-11 | Discharge: 2017-03-11 | Disposition: A | Discharge status: Home / Self Care | Payer: Medicare Other | Source: Ambulatory Visit | Attending: Orthopedic Surgery | Admitting: Orthopedic Surgery

## 2017-03-11 DIAGNOSIS — T84093A Other mechanical complication of internal left knee prosthesis, initial encounter: Secondary | ICD-10-CM | POA: Diagnosis not present

## 2017-03-11 DIAGNOSIS — R001 Bradycardia, unspecified: Secondary | ICD-10-CM | POA: Diagnosis not present

## 2017-03-11 DIAGNOSIS — Z01810 Encounter for preprocedural cardiovascular examination: Secondary | ICD-10-CM | POA: Diagnosis present

## 2017-03-11 DIAGNOSIS — Z01812 Encounter for preprocedural laboratory examination: Secondary | ICD-10-CM | POA: Insufficient documentation

## 2017-03-11 HISTORY — DX: Personal history of urinary calculi: Z87.442

## 2017-03-11 HISTORY — DX: Anxiety disorder, unspecified: F41.9

## 2017-03-11 HISTORY — DX: Headache, unspecified: R51.9

## 2017-03-11 HISTORY — DX: Personal history of other diseases of the digestive system: Z87.19

## 2017-03-11 HISTORY — DX: Headache: R51

## 2017-03-11 HISTORY — DX: Heart failure, unspecified: I50.9

## 2017-03-11 HISTORY — DX: Family history of other specified conditions: Z84.89

## 2017-03-11 HISTORY — DX: Pneumonia, unspecified organism: J18.9

## 2017-03-11 HISTORY — DX: Cardiac arrhythmia, unspecified: I49.9

## 2017-03-11 HISTORY — DX: Essential (primary) hypertension: I10

## 2017-03-11 HISTORY — DX: Dyspnea, unspecified: R06.00

## 2017-03-11 LAB — CBC
HCT: 37.9 % (ref 36.0–46.0)
Hemoglobin: 12.4 g/dL (ref 12.0–15.0)
MCH: 28.9 pg (ref 26.0–34.0)
MCHC: 32.7 g/dL (ref 30.0–36.0)
MCV: 88.3 fL (ref 78.0–100.0)
PLATELETS: 212 10*3/uL (ref 150–400)
RBC: 4.29 MIL/uL (ref 3.87–5.11)
RDW: 15.4 % (ref 11.5–15.5)
WBC: 8.4 10*3/uL (ref 4.0–10.5)

## 2017-03-11 LAB — SURGICAL PCR SCREEN
MRSA, PCR: NEGATIVE
Staphylococcus aureus: POSITIVE — AB

## 2017-03-11 LAB — BASIC METABOLIC PANEL
ANION GAP: 8 (ref 5–15)
BUN: 13 mg/dL (ref 6–20)
CO2: 28 mmol/L (ref 22–32)
Calcium: 9.1 mg/dL (ref 8.9–10.3)
Chloride: 101 mmol/L (ref 101–111)
Creatinine, Ser: 1.06 mg/dL — ABNORMAL HIGH (ref 0.44–1.00)
GFR calc Af Amer: 60 mL/min — ABNORMAL LOW (ref >60)
GFR calc non Af Amer: 52 mL/min — ABNORMAL LOW (ref >60)
GLUCOSE: 97 mg/dL (ref 65–99)
POTASSIUM: 4.4 mmol/L (ref 3.5–5.1)
Sodium: 137 mmol/L (ref 135–145)

## 2017-03-12 LAB — TYPE AND SCREEN
ABO/RH(D): B POS
ANTIBODY SCREEN: POSITIVE
DAT, IgG: NEGATIVE
PT AG TYPE: POSITIVE
Weak D: POSITIVE

## 2017-03-12 NOTE — Progress Notes (Signed)
Spoke with Ms. Rothenberger she states her Dr.'s office is supposed to call her back regarding stopping her Xarelto and she also got my vm about her Positive staph results.

## 2017-03-17 ENCOUNTER — Inpatient Hospital Stay (HOSPITAL_COMMUNITY): Payer: Medicare Other | Admitting: Anesthesiology

## 2017-03-17 ENCOUNTER — Encounter (HOSPITAL_COMMUNITY)
Admission: RE | Disposition: A | Discharge status: Home Health | Payer: Self-pay | Source: Ambulatory Visit | Attending: Orthopedic Surgery

## 2017-03-17 ENCOUNTER — Inpatient Hospital Stay (HOSPITAL_COMMUNITY)
Admission: RE | Admit: 2017-03-17 | Discharge: 2017-03-19 | DRG: 467 | Disposition: A | Discharge status: Home Health | Payer: Medicare Other | Source: Ambulatory Visit | Attending: Orthopedic Surgery | Admitting: Orthopedic Surgery

## 2017-03-17 ENCOUNTER — Encounter (HOSPITAL_COMMUNITY): Payer: Self-pay | Admitting: *Deleted

## 2017-03-17 DIAGNOSIS — I5030 Unspecified diastolic (congestive) heart failure: Secondary | ICD-10-CM | POA: Diagnosis present

## 2017-03-17 DIAGNOSIS — T84033A Mechanical loosening of internal left knee prosthetic joint, initial encounter: Secondary | ICD-10-CM | POA: Diagnosis present

## 2017-03-17 DIAGNOSIS — K219 Gastro-esophageal reflux disease without esophagitis: Secondary | ICD-10-CM | POA: Diagnosis present

## 2017-03-17 DIAGNOSIS — I110 Hypertensive heart disease with heart failure: Secondary | ICD-10-CM | POA: Diagnosis present

## 2017-03-17 DIAGNOSIS — Z881 Allergy status to other antibiotic agents status: Secondary | ICD-10-CM

## 2017-03-17 DIAGNOSIS — E669 Obesity, unspecified: Secondary | ICD-10-CM | POA: Diagnosis present

## 2017-03-17 DIAGNOSIS — Z6835 Body mass index (BMI) 35.0-35.9, adult: Secondary | ICD-10-CM | POA: Diagnosis not present

## 2017-03-17 DIAGNOSIS — G2581 Restless legs syndrome: Secondary | ICD-10-CM | POA: Diagnosis present

## 2017-03-17 DIAGNOSIS — Z87891 Personal history of nicotine dependence: Secondary | ICD-10-CM

## 2017-03-17 DIAGNOSIS — Z888 Allergy status to other drugs, medicaments and biological substances status: Secondary | ICD-10-CM | POA: Diagnosis not present

## 2017-03-17 DIAGNOSIS — E039 Hypothyroidism, unspecified: Secondary | ICD-10-CM | POA: Diagnosis present

## 2017-03-17 DIAGNOSIS — Z79899 Other long term (current) drug therapy: Secondary | ICD-10-CM

## 2017-03-17 DIAGNOSIS — Z91048 Other nonmedicinal substance allergy status: Secondary | ICD-10-CM

## 2017-03-17 DIAGNOSIS — T84093A Other mechanical complication of internal left knee prosthesis, initial encounter: Secondary | ICD-10-CM | POA: Diagnosis present

## 2017-03-17 DIAGNOSIS — Z9849 Cataract extraction status, unspecified eye: Secondary | ICD-10-CM | POA: Diagnosis not present

## 2017-03-17 DIAGNOSIS — Z96652 Presence of left artificial knee joint: Secondary | ICD-10-CM

## 2017-03-17 HISTORY — PX: REIMPLANTATION OF TOTAL KNEE: SHX6052

## 2017-03-17 SURGERY — REVISION, TOTAL ARTHROPLASTY, KNEE
Anesthesia: General | Site: Knee | Laterality: Left

## 2017-03-17 MED ORDER — AMIODARONE HCL 200 MG PO TABS
200.0000 mg | ORAL_TABLET | Freq: Every day | ORAL | Status: DC
  Administered 2017-03-17 – 2017-03-18 (×2): 200 mg via ORAL
  Filled 2017-03-17 (×2): qty 1

## 2017-03-17 MED ORDER — POTASSIUM CHLORIDE CRYS ER 10 MEQ PO TBCR
10.0000 meq | EXTENDED_RELEASE_TABLET | ORAL | Status: DC
  Administered 2017-03-18: 10:00:00 10 meq via ORAL
  Filled 2017-03-17: qty 1

## 2017-03-17 MED ORDER — ROPINIROLE HCL 1 MG PO TABS
2.0000 mg | ORAL_TABLET | Freq: Four times a day (QID) | ORAL | Status: DC
  Administered 2017-03-17 – 2017-03-19 (×9): 2 mg via ORAL
  Filled 2017-03-17 (×9): qty 2

## 2017-03-17 MED ORDER — MIDAZOLAM HCL 2 MG/2ML IJ SOLN
INTRAMUSCULAR | Status: AC
  Filled 2017-03-17: qty 2

## 2017-03-17 MED ORDER — CEFAZOLIN SODIUM-DEXTROSE 2-4 GM/100ML-% IV SOLN
2.0000 g | INTRAVENOUS | Status: AC
  Administered 2017-03-17: 2 g via INTRAVENOUS

## 2017-03-17 MED ORDER — KETOROLAC TROMETHAMINE 30 MG/ML IJ SOLN
INTRAMUSCULAR | Status: DC | PRN
  Administered 2017-03-17: 30 mg via INTRAVENOUS

## 2017-03-17 MED ORDER — BUPIVACAINE-EPINEPHRINE (PF) 0.5% -1:200000 IJ SOLN
INTRAMUSCULAR | Status: DC | PRN
Start: 2017-03-17 — End: 2017-03-17
  Administered 2017-03-17: 30 mL via PERINEURAL

## 2017-03-17 MED ORDER — ONDANSETRON HCL 4 MG/2ML IJ SOLN
INTRAMUSCULAR | Status: AC
  Filled 2017-03-17: qty 2

## 2017-03-17 MED ORDER — HYDROCODONE-ACETAMINOPHEN 7.5-325 MG PO TABS
1.0000 | ORAL_TABLET | ORAL | Status: DC
  Administered 2017-03-17 (×2): 2 via ORAL
  Administered 2017-03-17 (×2): 1 via ORAL
  Administered 2017-03-18 – 2017-03-19 (×10): 2 via ORAL
  Filled 2017-03-17 (×4): qty 2
  Filled 2017-03-17: qty 1
  Filled 2017-03-17 (×2): qty 2
  Filled 2017-03-17: qty 1
  Filled 2017-03-17 (×7): qty 2

## 2017-03-17 MED ORDER — LEVOTHYROXINE SODIUM 50 MCG PO TABS: 50.0000 ug | ORAL_TABLET | Freq: Every day | ORAL | Status: DC

## 2017-03-17 MED ORDER — MEPERIDINE HCL 50 MG/ML IJ SOLN: 6.2500 mg | INTRAMUSCULAR | Status: DC | PRN

## 2017-03-17 MED ORDER — PHENYLEPHRINE HCL 10 MG/ML IJ SOLN
INTRAMUSCULAR | Status: DC | PRN
  Administered 2017-03-17: 40 ug via INTRAVENOUS
  Administered 2017-03-17: 80 ug via INTRAVENOUS
  Administered 2017-03-17: 40 ug via INTRAVENOUS

## 2017-03-17 MED ORDER — ONDANSETRON HCL 4 MG/2ML IJ SOLN: 4.0000 mg | Freq: Four times a day (QID) | INTRAMUSCULAR | Status: DC | PRN

## 2017-03-17 MED ORDER — FENTANYL CITRATE (PF) 100 MCG/2ML IJ SOLN
INTRAMUSCULAR | Status: AC
  Filled 2017-03-17: qty 2

## 2017-03-17 MED ORDER — HYDROXYZINE HCL 25 MG PO TABS: 25.0000 mg | ORAL_TABLET | Freq: Three times a day (TID) | ORAL | Status: DC | PRN

## 2017-03-17 MED ORDER — BUPIVACAINE-EPINEPHRINE (PF) 0.25% -1:200000 IJ SOLN
INTRAMUSCULAR | Status: DC | PRN
  Administered 2017-03-17: 30 mL via PERINEURAL

## 2017-03-17 MED ORDER — SODIUM CHLORIDE 0.9 % IV SOLN
INTRAVENOUS | Status: DC
  Administered 2017-03-17: 13:00:00 via INTRAVENOUS

## 2017-03-17 MED ORDER — LEVOTHYROXINE SODIUM 50 MCG PO TABS
50.0000 ug | ORAL_TABLET | Freq: Every day | ORAL | Status: DC
  Administered 2017-03-17 – 2017-03-18 (×2): 50 ug via ORAL
  Filled 2017-03-17 (×4): qty 1

## 2017-03-17 MED ORDER — TRANEXAMIC ACID 1000 MG/10ML IV SOLN
1000.0000 mg | INTRAVENOUS | Status: AC
  Administered 2017-03-17: 1000 mg via INTRAVENOUS
  Filled 2017-03-17: qty 1100

## 2017-03-17 MED ORDER — METOCLOPRAMIDE HCL 5 MG/ML IJ SOLN: 10.0000 mg | Freq: Once | INTRAMUSCULAR | Status: DC | PRN

## 2017-03-17 MED ORDER — PANTOPRAZOLE SODIUM 40 MG PO TBEC
40.0000 mg | DELAYED_RELEASE_TABLET | Freq: Every day | ORAL | Status: DC
  Filled 2017-03-17 (×2): qty 1

## 2017-03-17 MED ORDER — FENTANYL CITRATE (PF) 100 MCG/2ML IJ SOLN
25.0000 ug | INTRAMUSCULAR | Status: DC | PRN
  Administered 2017-03-17: 13:00:00 50 ug via INTRAVENOUS
  Filled 2017-03-17: qty 2

## 2017-03-17 MED ORDER — FERROUS SULFATE 325 (65 FE) MG PO TABS
325.0000 mg | ORAL_TABLET | Freq: Three times a day (TID) | ORAL | Status: DC
  Administered 2017-03-18 – 2017-03-19 (×5): 325 mg via ORAL
  Filled 2017-03-17 (×5): qty 1

## 2017-03-17 MED ORDER — METOCLOPRAMIDE HCL 5 MG PO TABS: 5.0000 mg | ORAL_TABLET | Freq: Three times a day (TID) | ORAL | Status: DC | PRN

## 2017-03-17 MED ORDER — ONDANSETRON HCL 4 MG/2ML IJ SOLN
INTRAMUSCULAR | Status: DC | PRN
  Administered 2017-03-17: 4 mg via INTRAVENOUS

## 2017-03-17 MED ORDER — CITALOPRAM HYDROBROMIDE 20 MG PO TABS
40.0000 mg | ORAL_TABLET | Freq: Every evening | ORAL | Status: DC
  Administered 2017-03-17 – 2017-03-18 (×2): 40 mg via ORAL
  Filled 2017-03-17 (×3): qty 2

## 2017-03-17 MED ORDER — TRANEXAMIC ACID 1000 MG/10ML IV SOLN
1000.0000 mg | Freq: Once | INTRAVENOUS | Status: AC
  Administered 2017-03-17: 14:00:00 1000 mg via INTRAVENOUS
  Filled 2017-03-17: qty 1100

## 2017-03-17 MED ORDER — FUROSEMIDE 20 MG PO TABS
20.0000 mg | ORAL_TABLET | Freq: Every day | ORAL | Status: DC | PRN
  Administered 2017-03-17: 14:00:00 20 mg via ORAL
  Filled 2017-03-17: qty 1

## 2017-03-17 MED ORDER — RISPERIDONE 0.25 MG PO TABS
0.5000 mg | ORAL_TABLET | Freq: Two times a day (BID) | ORAL | Status: DC
  Administered 2017-03-17 – 2017-03-19 (×4): 0.5 mg via ORAL
  Filled 2017-03-17 (×4): qty 2

## 2017-03-17 MED ORDER — CEFAZOLIN SODIUM-DEXTROSE 2-4 GM/100ML-% IV SOLN
INTRAVENOUS | Status: AC
  Filled 2017-03-17: qty 100

## 2017-03-17 MED ORDER — LACTATED RINGERS IV SOLN
INTRAVENOUS | Status: DC
  Administered 2017-03-17 (×2): via INTRAVENOUS

## 2017-03-17 MED ORDER — MIDAZOLAM HCL 2 MG/2ML IJ SOLN
1.0000 mg | Freq: Once | INTRAMUSCULAR | Status: AC
  Administered 2017-03-17: 1 mg via INTRAVENOUS

## 2017-03-17 MED ORDER — EPHEDRINE SULFATE 50 MG/ML IJ SOLN
INTRAMUSCULAR | Status: DC | PRN
  Administered 2017-03-17: 5 mg via INTRAVENOUS
  Administered 2017-03-17 (×2): 10 mg via INTRAVENOUS

## 2017-03-17 MED ORDER — RIVAROXABAN 10 MG PO TABS
10.0000 mg | ORAL_TABLET | ORAL | Status: DC
  Administered 2017-03-18 – 2017-03-19 (×2): 10 mg via ORAL
  Filled 2017-03-17 (×2): qty 1

## 2017-03-17 MED ORDER — FENTANYL CITRATE (PF) 100 MCG/2ML IJ SOLN
25.0000 ug | INTRAMUSCULAR | Status: DC | PRN
  Administered 2017-03-17 (×3): 50 ug via INTRAVENOUS

## 2017-03-17 MED ORDER — SODIUM CHLORIDE 0.9 % IR SOLN
Status: DC | PRN
  Administered 2017-03-17: 1000 mL

## 2017-03-17 MED ORDER — EPHEDRINE 5 MG/ML INJ
INTRAVENOUS | Status: AC
  Filled 2017-03-17: qty 10

## 2017-03-17 MED ORDER — ALUM & MAG HYDROXIDE-SIMETH 200-200-20 MG/5ML PO SUSP: 15.0000 mL | ORAL | Status: DC | PRN

## 2017-03-17 MED ORDER — GABAPENTIN 300 MG PO CAPS
600.0000 mg | ORAL_CAPSULE | Freq: Every day | ORAL | Status: DC
  Administered 2017-03-17 – 2017-03-18 (×2): 600 mg via ORAL
  Filled 2017-03-17 (×2): qty 2

## 2017-03-17 MED ORDER — PROPOFOL 500 MG/50ML IV EMUL
INTRAVENOUS | Status: DC | PRN
  Administered 2017-03-17: 150 mg via INTRAVENOUS

## 2017-03-17 MED ORDER — KETOROLAC TROMETHAMINE 30 MG/ML IJ SOLN
INTRAMUSCULAR | Status: AC
  Filled 2017-03-17: qty 1

## 2017-03-17 MED ORDER — METOPROLOL SUCCINATE ER 50 MG PO TB24
50.0000 mg | ORAL_TABLET | Freq: Two times a day (BID) | ORAL | Status: DC
  Administered 2017-03-19: 50 mg via ORAL
  Filled 2017-03-17 (×4): qty 1

## 2017-03-17 MED ORDER — METHOCARBAMOL 500 MG PO TABS
500.0000 mg | ORAL_TABLET | Freq: Four times a day (QID) | ORAL | Status: DC | PRN
  Administered 2017-03-18: 19:00:00 500 mg via ORAL
  Filled 2017-03-17: qty 1

## 2017-03-17 MED ORDER — POLYETHYLENE GLYCOL 3350 17 G PO PACK
17.0000 g | PACK | Freq: Two times a day (BID) | ORAL | Status: DC
  Filled 2017-03-17 (×2): qty 1

## 2017-03-17 MED ORDER — SODIUM CHLORIDE 0.9 % IJ SOLN
INTRAMUSCULAR | Status: AC
  Filled 2017-03-17: qty 50

## 2017-03-17 MED ORDER — MENTHOL 3 MG MT LOZG: 1.0000 | LOZENGE | OROMUCOSAL | Status: DC | PRN

## 2017-03-17 MED ORDER — FENTANYL CITRATE (PF) 100 MCG/2ML IJ SOLN
INTRAMUSCULAR | Status: AC
  Administered 2017-03-17: 50 ug via INTRAVENOUS
  Filled 2017-03-17: qty 4

## 2017-03-17 MED ORDER — LORATADINE 10 MG PO TABS
10.0000 mg | ORAL_TABLET | Freq: Every day | ORAL | Status: DC
  Filled 2017-03-17 (×2): qty 1

## 2017-03-17 MED ORDER — PHENOL 1.4 % MT LIQD: 1.0000 | OROMUCOSAL | Status: DC | PRN

## 2017-03-17 MED ORDER — FENTANYL CITRATE (PF) 100 MCG/2ML IJ SOLN
INTRAMUSCULAR | Status: DC | PRN
  Administered 2017-03-17: 25 ug via INTRAVENOUS
  Administered 2017-03-17: 50 ug via INTRAVENOUS
  Administered 2017-03-17 (×3): 25 ug via INTRAVENOUS
  Administered 2017-03-17: 50 ug via INTRAVENOUS

## 2017-03-17 MED ORDER — MAGNESIUM CITRATE PO SOLN: 1.0000 | Freq: Once | ORAL | Status: DC | PRN

## 2017-03-17 MED ORDER — CEFAZOLIN SODIUM-DEXTROSE 2-4 GM/100ML-% IV SOLN
2.0000 g | Freq: Four times a day (QID) | INTRAVENOUS | Status: AC
  Administered 2017-03-17 (×2): 2 g via INTRAVENOUS
  Filled 2017-03-17 (×2): qty 100

## 2017-03-17 MED ORDER — DEXAMETHASONE SODIUM PHOSPHATE 10 MG/ML IJ SOLN
10.0000 mg | Freq: Once | INTRAMUSCULAR | Status: AC
  Administered 2017-03-17: 10 mg via INTRAVENOUS

## 2017-03-17 MED ORDER — PROPOFOL 10 MG/ML IV BOLUS
INTRAVENOUS | Status: AC
  Filled 2017-03-17: qty 40

## 2017-03-17 MED ORDER — ONDANSETRON HCL 4 MG PO TABS: 4.0000 mg | ORAL_TABLET | Freq: Four times a day (QID) | ORAL | Status: DC | PRN

## 2017-03-17 MED ORDER — DEXAMETHASONE SODIUM PHOSPHATE 10 MG/ML IJ SOLN
10.0000 mg | Freq: Once | INTRAMUSCULAR | Status: AC
  Administered 2017-03-18: 10 mg via INTRAVENOUS
  Filled 2017-03-17: qty 1

## 2017-03-17 MED ORDER — BISACODYL 10 MG RE SUPP: 10.0000 mg | Freq: Every day | RECTAL | Status: DC | PRN

## 2017-03-17 MED ORDER — CHLORHEXIDINE GLUCONATE 4 % EX LIQD: 60.0000 mL | Freq: Once | CUTANEOUS | Status: DC

## 2017-03-17 MED ORDER — BUPIVACAINE-EPINEPHRINE (PF) 0.5% -1:200000 IJ SOLN
INTRAMUSCULAR | Status: AC
  Filled 2017-03-17: qty 30

## 2017-03-17 MED ORDER — LEVOTHYROXINE SODIUM 112 MCG PO TABS
56.0000 ug | ORAL_TABLET | Freq: Every day | ORAL | Status: DC
  Administered 2017-03-18: 56 ug via ORAL
  Filled 2017-03-17: qty 0.5

## 2017-03-17 MED ORDER — FENTANYL CITRATE (PF) 100 MCG/2ML IJ SOLN
50.0000 ug | Freq: Once | INTRAMUSCULAR | Status: AC
  Administered 2017-03-17: 50 ug via INTRAVENOUS

## 2017-03-17 MED ORDER — FENTANYL CITRATE (PF) 100 MCG/2ML IJ SOLN
INTRAMUSCULAR | Status: AC
  Administered 2017-03-17: 50 ug via INTRAVENOUS
  Filled 2017-03-17: qty 2

## 2017-03-17 MED ORDER — LEVOTHYROXINE SODIUM 112 MCG PO TABS: 56.0000 ug | ORAL_TABLET | Freq: Every day | ORAL | Status: DC

## 2017-03-17 MED ORDER — METOCLOPRAMIDE HCL 5 MG/ML IJ SOLN: 5.0000 mg | Freq: Three times a day (TID) | INTRAMUSCULAR | Status: DC | PRN

## 2017-03-17 MED ORDER — DOCUSATE SODIUM 100 MG PO CAPS
100.0000 mg | ORAL_CAPSULE | Freq: Two times a day (BID) | ORAL | Status: DC
  Administered 2017-03-17 – 2017-03-19 (×3): 100 mg via ORAL
  Filled 2017-03-17 (×4): qty 1

## 2017-03-17 MED ORDER — METHOCARBAMOL 1000 MG/10ML IJ SOLN
500.0000 mg | Freq: Four times a day (QID) | INTRAVENOUS | Status: DC | PRN
  Administered 2017-03-17: 500 mg via INTRAVENOUS
  Filled 2017-03-17: qty 550

## 2017-03-17 SURGICAL SUPPLY — 78 items
ARTICULAR GENESIS CONST IN (Insert) ×3 IMPLANT
ARTISURF GENESIS (Insert) ×1 IMPLANT
AUGMENT TIBIAL SZ 3 LEFT KNEE (Knees) ×1 IMPLANT
BAG ZIPLOCK 12X15 (MISCELLANEOUS) ×3 IMPLANT
BANDAGE ACE 6X5 VEL STRL LF (GAUZE/BANDAGES/DRESSINGS) ×3 IMPLANT
BANDAGE ESMARK 6X9 LF (GAUZE/BANDAGES/DRESSINGS) ×1 IMPLANT
BLADE SAW SGTL 11.0X1.19X90.0M (BLADE) ×3 IMPLANT
BLADE SAW SGTL 13.0X1.19X90.0M (BLADE) ×3 IMPLANT
BLADE SAW SGTL 81X20 HD (BLADE) ×3 IMPLANT
BNDG CMPR 9X6 STRL LF SNTH (GAUZE/BANDAGES/DRESSINGS) ×1
BNDG ESMARK 6X9 LF (GAUZE/BANDAGES/DRESSINGS) ×3
BONE CEMENT GENTAMICIN (Cement) ×9 IMPLANT
BRUSH FEMORAL CANAL (MISCELLANEOUS) ×3 IMPLANT
BSPLAT TIB 3 CMNT REV F TPR KN (Knees) ×1 IMPLANT
CEMENT BONE GENTAMICIN 40 (Cement) ×3 IMPLANT
COVER SURGICAL LIGHT HANDLE (MISCELLANEOUS) ×3 IMPLANT
CUFF TOURN SGL QUICK 34 (TOURNIQUET CUFF) ×2
CUFF TRNQT CYL 34X4X40X1 (TOURNIQUET CUFF) ×1 IMPLANT
DISTAL WEDGE SIZE 15MM SZ3 (Orthopedic Implant) ×6 IMPLANT
DRAPE EXTREMITY T 121X128X90 (DRAPE) ×3 IMPLANT
DRAPE POUCH INSTRU U-SHP 10X18 (DRAPES) ×3 IMPLANT
DRAPE U-SHAPE 47X51 STRL (DRAPES) ×3 IMPLANT
DRESSING AQUACEL AG SP 3.5X10 (GAUZE/BANDAGES/DRESSINGS) ×1 IMPLANT
DRSG AQUACEL AG ADV 3.5X14 (GAUZE/BANDAGES/DRESSINGS) ×3 IMPLANT
DRSG AQUACEL AG SP 3.5X10 (GAUZE/BANDAGES/DRESSINGS) ×3
DRSG PAD ABDOMINAL 8X10 ST (GAUZE/BANDAGES/DRESSINGS) ×3 IMPLANT
DURAPREP 26ML APPLICATOR (WOUND CARE) ×3 IMPLANT
ELECT REM PT RETURN 15FT ADLT (MISCELLANEOUS) ×3 IMPLANT
FACESHIELD WRAPAROUND (MASK) ×15 IMPLANT
FEMORAL CONE LEFT KNEE (Miscellaneous) ×3 IMPLANT
FEMORAL OXINIUM COMP LEFT SZ3 (Orthopedic Implant) ×3 IMPLANT
GAUZE SPONGE 4X4 12PLY STRL (GAUZE/BANDAGES/DRESSINGS) ×6 IMPLANT
GAUZE XEROFORM 5X9 LF (GAUZE/BANDAGES/DRESSINGS) ×3 IMPLANT
GLOVE BIOGEL M 7.0 STRL (GLOVE) IMPLANT
GLOVE BIOGEL PI IND STRL 7.5 (GLOVE) ×1 IMPLANT
GLOVE BIOGEL PI IND STRL 8.5 (GLOVE) ×1 IMPLANT
GLOVE BIOGEL PI INDICATOR 7.5 (GLOVE) ×2
GLOVE BIOGEL PI INDICATOR 8.5 (GLOVE) ×2
GLOVE ECLIPSE 8.0 STRL XLNG CF (GLOVE) ×3 IMPLANT
GLOVE ORTHO TXT STRL SZ7.5 (GLOVE) ×6 IMPLANT
GOWN STRL REUS W/TWL LRG LVL3 (GOWN DISPOSABLE) ×3 IMPLANT
GOWN STRL REUS W/TWL XL LVL3 (GOWN DISPOSABLE) ×3 IMPLANT
HANDPIECE INTERPULSE COAX TIP (DISPOSABLE) ×3
IMMOBILIZER KNEE 20 (SOFTGOODS)
IMMOBILIZER KNEE 20 THIGH 36 (SOFTGOODS) IMPLANT
MANIFOLD NEPTUNE II (INSTRUMENTS) ×3 IMPLANT
NDL SAFETY ECLIPSE 18X1.5 (NEEDLE) ×1 IMPLANT
NEEDLE HYPO 18GX1.5 SHARP (NEEDLE) ×3
NS IRRIG 1000ML POUR BTL (IV SOLUTION) ×3 IMPLANT
PADDING CAST COTTON 6X4 STRL (CAST SUPPLIES) ×6 IMPLANT
PATELLA RESURF GEN II 32MM (Orthopedic Implant) ×3 IMPLANT
PIN TROCAR 3 INCH (PIN) ×9 IMPLANT
PIN TROCAR 5 (PIN) ×9 IMPLANT
POSITIONER SURGICAL ARM (MISCELLANEOUS) ×3 IMPLANT
PRESSFIT STEM STR 15X220 KNEE (Stem) ×3 IMPLANT
PRESSFIT STEM STR 18*220 KNEE (Stem) ×3 IMPLANT
SET HNDPC FAN SPRY TIP SCT (DISPOSABLE) ×1 IMPLANT
SET PAD KNEE POSITIONER (MISCELLANEOUS) ×3 IMPLANT
SPONGE LAP 18X18 X RAY DECT (DISPOSABLE) ×3 IMPLANT
STAPLER VISISTAT 35W (STAPLE) IMPLANT
STEM PRESSFIT STR 15X220 KNEE (Stem) ×1 IMPLANT
STEM PRESSFIT STR 18*220 KNEE (Stem) ×1 IMPLANT
SUCTION FRAZIER HANDLE 12FR (TUBING) ×2
SUCTION TUBE FRAZIER 12FR DISP (TUBING) ×1 IMPLANT
SUT MNCRL AB 3-0 PS2 18 (SUTURE) ×3 IMPLANT
SUT VIC AB 1 CT1 36 (SUTURE) ×15 IMPLANT
SUT VIC AB 2-0 CT1 27 (SUTURE) ×9
SUT VIC AB 2-0 CT1 TAPERPNT 27 (SUTURE) ×3 IMPLANT
SUT VLOC 180 0 24IN GS25 (SUTURE) ×3 IMPLANT
SYR 50ML LL SCALE MARK (SYRINGE) ×3 IMPLANT
TIBIA SZ 3 LEFT KNEE (Knees) ×3 IMPLANT
TOWEL OR 17X26 10 PK STRL BLUE (TOWEL DISPOSABLE) ×3 IMPLANT
TOWER CARTRIDGE SMART MIX (DISPOSABLE) ×3 IMPLANT
TRAY FOLEY W/METER SILVER 16FR (SET/KITS/TRAYS/PACK) ×3 IMPLANT
WATER STERILE IRR 1500ML POUR (IV SOLUTION) ×6 IMPLANT
WEDGE DISTAL 15MM SZ3 (Orthopedic Implant) ×2 IMPLANT
WRAP KNEE MAXI GEL POST OP (GAUZE/BANDAGES/DRESSINGS) ×3 IMPLANT
YANKAUER SUCT BULB TIP 10FT TU (MISCELLANEOUS) IMPLANT

## 2017-03-17 NOTE — Anesthesia Procedure Notes (Signed)
Procedure Name: LMA Insertion Date/Time: 03/17/2017 7:34 AM Performed by: Thornell MuleSTUBBLEFIELD, Shaheen Star G Pre-anesthesia Checklist: Patient identified, Emergency Drugs available, Suction available and Patient being monitored Patient Re-evaluated:Patient Re-evaluated prior to induction Oxygen Delivery Method: Circle system utilized Preoxygenation: Pre-oxygenation with 100% oxygen Induction Type: IV induction Ventilation: Mask ventilation without difficulty LMA: LMA inserted LMA Size: 4.0 Number of attempts: 1 Placement Confirmation: positive ETCO2 Tube secured with: Tape Dental Injury: Teeth and Oropharynx as per pre-operative assessment

## 2017-03-17 NOTE — Anesthesia Preprocedure Evaluation (Addendum)
Anesthesia Evaluation  Patient identified by MRN, date of birth, ID band Patient awake    Reviewed: Allergy & Precautions, NPO status , Patient's Chart, lab work & pertinent test results  Airway Mallampati: II  TM Distance: >3 FB Neck ROM: Full    Dental no notable dental hx.    Pulmonary former smoker,    Pulmonary exam normal breath sounds clear to auscultation       Cardiovascular hypertension, Pt. on medications +CHF  Normal cardiovascular exam+ dysrhythmias Atrial Fibrillation  Rhythm:Regular Rate:Normal     Neuro/Psych negative neurological ROS  negative psych ROS   GI/Hepatic Neg liver ROS, GERD  Controlled,  Endo/Other  Hypothyroidism   Renal/GU negative Renal ROS  negative genitourinary   Musculoskeletal negative musculoskeletal ROS (+)   Abdominal   Peds negative pediatric ROS (+)  Hematology negative hematology ROS (+)   Anesthesia Other Findings   Reproductive/Obstetrics negative OB ROS                             Anesthesia Physical Anesthesia Plan  ASA: III  Anesthesia Plan: General   Post-op Pain Management: GA combined w/ Regional for post-op pain   Induction: Intravenous  PONV Risk Score and Plan: 3 and Ondansetron, Dexamethasone, Midazolam and Propofol infusion  Airway Management Planned: LMA  Additional Equipment:   Intra-op Plan:   Post-operative Plan:   Informed Consent: I have reviewed the patients History and Physical, chart, labs and discussed the procedure including the risks, benefits and alternatives for the proposed anesthesia with the patient or authorized representative who has indicated his/her understanding and acceptance.   Dental advisory given  Plan Discussed with: CRNA  Anesthesia Plan Comments: (Not off xarelto x72 hrs as required. Needs GA plus adductor)        Anesthesia Quick Evaluation

## 2017-03-17 NOTE — Transfer of Care (Signed)
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

## 2017-03-17 NOTE — Evaluation (Signed)
Physical Therapy Evaluation Patient Details Name: Leighton Roachatty H Lindner MRN: [id number] DOB: May 06, 1946 Today's Date: 03/17/2017   History of Present Illness  71 yo female s/p L TK revision 03/17/17. Hx of legally blind, multiple surgeries L knee with last being a resection with abx spacer placement 10/2016.   Clinical Impression  On eval POD 0, pt required Min assist for mobility. She walked ~25 feet with a RW. Pain rated 4/10. Will progress activity as tolerated.     Follow Up Recommendations Home health PT;Supervision/Assistance - 24 hour    Equipment Recommendations  None recommended by PT    Recommendations for Other Services       Precautions / Restrictions Precautions Precautions: Fall;Knee Restrictions Weight Bearing Restrictions: Yes LLE Weight Bearing: Partial weight bearing LLE Partial Weight Bearing Percentage or Pounds: 50%      Mobility  Bed Mobility Overal bed mobility: Needs Assistance Bed Mobility: Supine to Sit     Supine to sit: HOB elevated;Min assist     General bed mobility comments: Assist for L LE. Increased time. VCs technique.   Transfers Overall transfer level: Needs assistance Equipment used: Rolling walker (2 wheeled) Transfers: Sit to/from Stand Sit to Stand: Min assist         General transfer comment: VCs safety, technique, hand/LE placement. Assist to rise, stabilize, hand/LE placement.   Ambulation/Gait Ambulation/Gait assistance: Min assist Ambulation Distance (Feet): 25 Feet Assistive device: Rolling walker (2 wheeled) Gait Pattern/deviations: Step-to pattern     General Gait Details: VCs safety, technique, sequence, adherence to PWB status. Assist to stabilize pt and maneuver safely with RW. Pt has difficulty due to vision deficits.   Stairs            Wheelchair Mobility    Modified Rankin (Stroke Patients Only)       Balance                                             Pertinent Vitals/Pain  Pain Assessment: 0-10 Pain Score: 4  Pain Location: L knee with activity Pain Descriptors / Indicators: Sore Pain Intervention(s): Limited activity within patient's tolerance;Repositioned    Home Living Family/patient expects to be discharged to:: Private residence Living Arrangements: Spouse/significant other Available Help at Discharge: Family Type of Home: House Home Access: Stairs to enter Entrance Stairs-Rails: None Entrance Stairs-Number of Steps: 2+1 Home Layout: One level Home Equipment: Environmental consultantWalker - 2 wheels;Bedside commode;Wheelchair - manual      Prior Function Level of Independence: Independent               Hand Dominance        Extremity/Trunk Assessment   Upper Extremity Assessment Upper Extremity Assessment: Defer to OT evaluation    Lower Extremity Assessment Lower Extremity Assessment: Generalized weakness (s/p L knee revision)    Cervical / Trunk Assessment Cervical / Trunk Assessment: Normal  Communication   Communication: No difficulties  Cognition Arousal/Alertness: Awake/alert Behavior During Therapy: WFL for tasks assessed/performed Overall Cognitive Status: Within Functional Limits for tasks assessed                                        General Comments      Exercises     Assessment/Plan    PT Assessment Patient needs continued  PT services  PT Problem List Decreased strength;Decreased mobility;Decreased range of motion;Decreased activity tolerance;Decreased balance;Decreased knowledge of use of DME;Pain;Decreased knowledge of precautions       PT Treatment Interventions DME instruction;Therapeutic activities;Gait training;Therapeutic exercise;Patient/family education;Balance training;Functional mobility training;Stair training    PT Goals (Current goals can be found in the Care Plan section)  Acute Rehab PT Goals Patient Stated Goal: for this to be last knee surgery PT Goal Formulation: With patient Time For  Goal Achievement: 03/31/17 Potential to Achieve Goals: Good    Frequency 7X/week   Barriers to discharge        Co-evaluation               AM-PAC PT "6 Clicks" Daily Activity  Outcome Measure Difficulty turning over in bed (including adjusting bedclothes, sheets and blankets)?: Total Difficulty moving from lying on back to sitting on the side of the bed? : Total Difficulty sitting down on and standing up from a chair with arms (e.g., wheelchair, bedside commode, etc,.)?: Total Help needed moving to and from a bed to chair (including a wheelchair)?: A Little Help needed walking in hospital room?: A Little Help needed climbing 3-5 steps with a railing? : A Little 6 Click Score: 12    End of Session Equipment Utilized During Treatment: Gait belt Activity Tolerance: Patient tolerated treatment well Patient left: in chair;with call bell/phone within reach   PT Visit Diagnosis: Muscle weakness (generalized) (M62.81);Difficulty in walking, not elsewhere classified (R26.2)    Time: [id number] PT Time Calculation (min) (ACUTE ONLY): 14 min   Charges:   PT Evaluation $PT Eval Low Complexity: 1 Low     PT G Codes:          Rebeca AlertJannie Eulis Salazar, MPT Pager: [phone]

## 2017-03-17 NOTE — Brief Op Note (Signed)
03/17/2017  10:20 AM  PATIENT:  Jill RoachPatty H Boyer  71 y.o. 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, placement of articulating antibiotic spacer   PROCEDURE:  Procedure(s): REIMPLANTATION OF LEFT TOTAL KNEE Arthroplasty (Left) - Katrinka BlazingSmith and Nephew - see dictated report for details of implants  SURGEON:  Surgeon(s) and Role:    Durene Romans* Akin Yi, MD - Primary  PHYSICIAN ASSISTANT: Lanney GinsMatthew Babish, PA-C  ANESTHESIA:   regional and general  EBL:  Total I/O In: 1000 [I.V.:1000] Out: 350 [Urine:150; Blood:200]  BLOOD ADMINISTERED:none  DRAINS: none   LOCAL MEDICATIONS USED:  MARCAINE     SPECIMEN:  No Specimen  DISPOSITION OF SPECIMEN:  N/A  COUNTS:  YES  TOURNIQUET:  102 min at 250mmHg  DICTATION: .Other Dictation: Dictation Number [id number]  PLAN OF CARE: Admit to inpatient   PATIENT DISPOSITION:  PACU - hemodynamically stable.   Delay start of Pharmacological VTE agent (>24hrs) due to surgical blood loss or risk of bleeding: no

## 2017-03-17 NOTE — Anesthesia Procedure Notes (Signed)
Anesthesia Regional Block: Adductor canal block   Pre-Anesthetic Checklist: ,, timeout performed, Correct Patient, Correct Site, Correct Laterality, Correct Procedure, Correct Position, site marked, Risks and benefits discussed,  Surgical consent,  Pre-op evaluation,  At surgeon's request and post-op pain management  Laterality: Left and Lower  Prep: Maximum Sterile Barrier Precautions used, chloraprep       Needles:  Injection technique: Single-shot  Needle Type: Echogenic Stimulator Needle     Needle Length: 10cm      Additional Needles:   Procedures: ultrasound guided,,,,,,,,  Narrative:  Start time: 03/17/2017 7:00 AM End time: 03/17/2017 7:05 AM Injection made incrementally with aspirations every 5 mL.  Performed by: Personally  Anesthesiologist: Phillips GroutARIGNAN, Avis Tirone  Additional Notes: Risks, benefits and alternative to block explained extensively.  Patient tolerated procedure well, without complications.

## 2017-03-17 NOTE — Progress Notes (Signed)
AssistedDr. Acey Lavarignan with left, adductor canal block. Side rails up, monitors on throughout procedure. See vital signs in flow sheet. Tolerated Procedure well. VS and orders assessed and pt is alert and calm and has no s/s pf distress. Will continue to monitor and tx pt according to MD orders.

## 2017-03-17 NOTE — Discharge Instructions (Addendum)
Information on my medicine - XARELTO (Rivaroxaban)   Why was Xarelto prescribed for you? Xarelto was prescribed for you to reduce the risk of blood clots forming after orthopedic surgery. The medical term for these abnormal blood clots is venous thromboembolism (VTE).  What do you need to know about xarelto ? Take your Xarelto ONCE DAILY at the same time every day. You may take it either with or without food.  If you have difficulty swallowing the tablet whole, you may crush it and mix in applesauce just prior to taking your dose.  Take Xarelto exactly as prescribed by your doctor and DO NOT stop taking Xarelto without talking to the doctor who prescribed the medication.  Stopping without other VTE prevention medication to take the place of Xarelto may increase your risk of developing a clot.  After discharge, you should have regular check-up appointments with your healthcare provider that is prescribing your Xarelto.    What do you do if you miss a dose? If you miss a dose, take it as soon as you remember on the same day then continue your regularly scheduled once daily regimen the next day. Do not take two doses of Xarelto on the same day.   Important Safety Information A possible side effect of Xarelto is bleeding. You should call your healthcare provider right away if you experience any of the following: ? Bleeding from an injury or your nose that does not stop. ? Unusual colored urine (red or dark brown) or unusual colored stools (red or black). ? Unusual bruising for unknown reasons. ? A serious fall or if you hit your head (even if there is no bleeding).  Some medicines may interact with Xarelto and might increase your risk of bleeding while on Xarelto. To help avoid this, consult your healthcare provider or pharmacist prior to using any new prescription or non-prescription medications, including herbals, vitamins, non-steroidal anti-inflammatory drugs (NSAIDs) and  supplements.  This website has more information on Xarelto: VisitDestination.com.br.  __________________________________________________________________________________________________________________   INSTRUCTIONS AFTER JOINT REPLACEMENT   o Remove items at home which could result in a fall. This includes throw rugs or furniture in walking pathways o ICE to the affected joint every three hours while awake for 30 minutes at a time, for at least the first 3-5 days, and then as needed for pain and swelling.  Continue to use ice for pain and swelling. You may notice swelling that will progress down to the foot and ankle.  This is normal after surgery.  Elevate your leg when you are not up walking on it.   o Continue to use the breathing machine you got in the hospital (incentive spirometer) which will help keep your temperature down.  It is common for your temperature to cycle up and down following surgery, especially at night when you are not up moving around and exerting yourself.  The breathing machine keeps your lungs expanded and your temperature down.   DIET:  As you were doing prior to hospitalization, we recommend a well-balanced diet.  DRESSING / WOUND CARE / SHOWERING  Keep the surgical dressing until follow up.  The dressing is water proof, so you can shower without any extra covering.  IF THE DRESSING FALLS OFF or the wound gets wet inside, change the dressing with sterile gauze.  Please use good hand washing techniques before changing the dressing.  Do not use any lotions or creams on the incision until instructed by your surgeon.    ACTIVITY  o Increase activity slowly as tolerated, but follow the weight bearing instructions below.   o No driving for 6 weeks or until further direction given by your physician.  You cannot drive while taking narcotics.  o No lifting or carrying greater than 10 lbs. until further directed by your surgeon. o Avoid periods of inactivity such as sitting  longer than an hour when not asleep. This helps prevent blood clots.  o You may return to work once you are authorized by your doctor.     WEIGHT BEARING   Partial weight bearing with assist device as directed.  50% left lower extremity.   EXERCISES  Results after joint replacement surgery are often greatly improved when you follow the exercise, range of motion and muscle strengthening exercises prescribed by your doctor. Safety measures are also important to protect the joint from further injury. Any time any of these exercises cause you to have increased pain or swelling, decrease what you are doing until you are comfortable again and then slowly increase them. If you have problems or questions, call your caregiver or physical therapist for advice.   Rehabilitation is important following a joint replacement. After just a few days of immobilization, the muscles of the leg can become weakened and shrink (atrophy).  These exercises are designed to build up the tone and strength of the thigh and leg muscles and to improve motion. Often times heat used for twenty to thirty minutes before working out will loosen up your tissues and help with improving the range of motion but do not use heat for the first two weeks following surgery (sometimes heat can increase post-operative swelling).   These exercises can be done on a training (exercise) mat, on the floor, on a table or on a bed. Use whatever works the best and is most comfortable for you.    Use music or television while you are exercising so that the exercises are a pleasant break in your day. This will make your life better with the exercises acting as a break in your routine that you can look forward to.   Perform all exercises about fifteen times, three times per day or as directed.  You should exercise both the operative leg and the other leg as well.  Exercises include:    Quad Sets - Tighten up the muscle on the front of the thigh (Quad)  and hold for 5-10 seconds.    Straight Leg Raises - With your knee straight (if you were given a brace, keep it on), lift the leg to 60 degrees, hold for 3 seconds, and slowly lower the leg.  Perform this exercise against resistance later as your leg gets stronger.   Leg Slides: Lying on your back, slowly slide your foot toward your buttocks, bending your knee up off the floor (only go as far as is comfortable). Then slowly slide your foot back down until your leg is flat on the floor again.   Angel Wings: Lying on your back spread your legs to the side as far apart as you can without causing discomfort.   Hamstring Strength:  Lying on your back, push your heel against the floor with your leg straight by tightening up the muscles of your buttocks.  Repeat, but this time bend your knee to a comfortable angle, and push your heel against the floor.  You may put a pillow under the heel to make it more comfortable if necessary.   A rehabilitation program following joint  replacement surgery can speed recovery and prevent re-injury in the future due to weakened muscles. Contact your doctor or a physical therapist for more information on knee rehabilitation.    CONSTIPATION  Constipation is defined medically as fewer than three stools per week and severe constipation as less than one stool per week.  Even if you have a regular bowel pattern at home, your normal regimen is likely to be disrupted due to multiple reasons following surgery.  Combination of anesthesia, postoperative narcotics, change in appetite and fluid intake all can affect your bowels.   YOU MUST use at least one of the following options; they are listed in order of increasing strength to get the job done.  They are all available over the counter, and you may need to use some, POSSIBLY even all of these options:    Drink plenty of fluids (prune juice may be helpful) and high fiber foods Colace 100 mg by mouth twice a day  Senokot for  constipation as directed and as needed Dulcolax (bisacodyl), take with full glass of water  Miralax (polyethylene glycol) once or twice a day as needed.  If you have tried all these things and are unable to have a bowel movement in the first 3-4 days after surgery call either your surgeon or your primary doctor.    If you experience loose stools or diarrhea, hold the medications until you stool forms back up.  If your symptoms do not get better within 1 week or if they get worse, check with your doctor.  If you experience "the worst abdominal pain ever" or develop nausea or vomiting, please contact the office immediately for further recommendations for treatment.   ITCHING:  If you experience itching with your medications, try taking only a single pain pill, or even half a pain pill at a time.  You can also use Benadryl over the counter for itching or also to help with sleep.   TED HOSE STOCKINGS:  Use stockings on both legs until for at least 2 weeks or as directed by physician office. They may be removed at night for sleeping.  MEDICATIONS:  See your medication summary on the After Visit Summary that nursing will review with you.  You may have some home medications which will be placed on hold until you complete the course of blood thinner medication.  It is important for you to complete the blood thinner medication as prescribed.  PRECAUTIONS:  If you experience chest pain or shortness of breath - call 911 immediately for transfer to the hospital emergency department.   If you develop a fever greater that 101 F, purulent drainage from wound, increased redness or drainage from wound, foul odor from the wound/dressing, or calf pain - CONTACT YOUR SURGEON.                                                   FOLLOW-UP APPOINTMENTS:  If you do not already have a post-op appointment, please call the office for an appointment to be seen by your surgeon.  Guidelines for how soon to be seen are listed in  your After Visit Summary, but are typically between 1-4 weeks after surgery.  OTHER INSTRUCTIONS:   Knee Replacement:  Do not place pillow under knee, focus on keeping the knee straight while resting.   MAKE SURE YOU:  Understand these instructions.   Get help right away if you are not doing well or get worse.    Thank you for letting us be a part of your medical care team.  It is a privilege we respect greatly.  We hope these instructions will help you stay on track for a fast and full recovery!

## 2017-03-17 NOTE — Interval H&P Note (Signed)
History and Physical Interval Note:  03/17/2017 7:11 AM  Jill Royanne FootsH Burbach  has presented today for surgery, with the diagnosis of Failed Left total knee arthroplasty   The various methods of treatment have been discussed with the patient and family. After consideration of risks, benefits and other options for treatment, the patient has consented to  Procedure(s): REIMPLANTATION OF LEFT TOTAL KNEE Arthroplasty (Left) as a surgical intervention .  The patient's history has been reviewed, patient examined, no change in status, stable for surgery.  I have reviewed the patient's chart and labs.  Questions were answered to the patient's satisfaction.     Shelda PalLIN,Oyuki Hogan D

## 2017-03-17 NOTE — Anesthesia Postprocedure Evaluation (Signed)
Anesthesia Post Note  Patient: Leighton Roachatty H Marcon  Procedure(s) Performed: Procedure(s) (LRB): REIMPLANTATION OF LEFT TOTAL KNEE Arthroplasty (Left)     Patient location during evaluation: PACU Anesthesia Type: General and Regional Level of consciousness: awake and alert Pain management: pain level controlled Vital Signs Assessment: post-procedure vital signs reviewed and stable Respiratory status: spontaneous breathing, nonlabored ventilation, respiratory function stable and patient connected to nasal cannula oxygen Cardiovascular status: blood pressure returned to baseline and stable Postop Assessment: no signs of nausea or vomiting Anesthetic complications: no    Last Vitals:  Vitals:   03/17/17 1214 03/17/17 1314  BP: 115/68 123/64  Pulse: 60 66  Resp: 16 16  Temp: 37.1 C 36.7 C    Last Pain:  Vitals:   03/17/17 1314  TempSrc: Oral  PainSc:                  Phillips Groutarignan, Shi Grose

## 2017-03-18 ENCOUNTER — Encounter (HOSPITAL_COMMUNITY): Payer: Self-pay | Admitting: Orthopedic Surgery

## 2017-03-18 LAB — CBC
HEMATOCRIT: 29.7 % — AB (ref 36.0–46.0)
Hemoglobin: 9.5 g/dL — ABNORMAL LOW (ref 12.0–15.0)
MCH: 28.5 pg (ref 26.0–34.0)
MCHC: 32 g/dL (ref 30.0–36.0)
MCV: 89.2 fL (ref 78.0–100.0)
PLATELETS: 190 10*3/uL (ref 150–400)
RBC: 3.33 MIL/uL — ABNORMAL LOW (ref 3.87–5.11)
RDW: 15.7 % — AB (ref 11.5–15.5)
WBC: 10.1 10*3/uL (ref 4.0–10.5)

## 2017-03-18 LAB — BASIC METABOLIC PANEL
Anion gap: 11 (ref 5–15)
BUN: 16 mg/dL (ref 6–20)
CHLORIDE: 99 mmol/L — AB (ref 101–111)
CO2: 24 mmol/L (ref 22–32)
CREATININE: 1.12 mg/dL — AB (ref 0.44–1.00)
Calcium: 8.4 mg/dL — ABNORMAL LOW (ref 8.9–10.3)
GFR calc Af Amer: 56 mL/min — ABNORMAL LOW (ref >60)
GFR calc non Af Amer: 48 mL/min — ABNORMAL LOW (ref >60)
Glucose, Bld: 165 mg/dL — ABNORMAL HIGH (ref 65–99)
POTASSIUM: 5 mmol/L (ref 3.5–5.1)
Sodium: 134 mmol/L — ABNORMAL LOW (ref 135–145)

## 2017-03-18 NOTE — Op Note (Signed)
NAME:  [name], Jill                    ACCOUNT NO.:  MEDICAL RECORD NO.:  [id number]  LOCATION:                                 FACILITY:  PHYSICIAN:  Madlyn FrankelMatthew D. Charlann Boxerlin, M.D.  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  PROCEDURE:  Revision/reimplantation of left total knee with Katrinka BlazingSmith and Nephew components, size 3 left LEGION revision tibial tray with a straight 15-mm x 220-mm press-fit stem on the tibia side.  The femoral component was a size 3 left LEGION Oxinium constrained femoral component with 15-mm distal medial and lateral augments, a size 22 LEGION femoral cone, a 16 x 220-mm press-fit stem, a 32-mm patellar button and a size 11 constrained insert.  Please note that we used Katrinka BlazingSmith and Nephew press-fit components for the reason that she had significant metal allergy one of the leading causes for her failed implants.  SURGEON:  Madlyn FrankelMatthew D. Charlann Boxerlin, M.D.  ASSISTANT:  Lanney GinsMatthew Babish, PA-C.  ANESTHESIA:  Preoperative regional block plus general.  SPECIMENS:  None.  COMPLICATIONS:  None.  DRAINS:  None.  TOURNIQUET TIME:  102 minutes at 250 mmHg.  BLOOD LOSS:  About 250 mL.  INDICATIONS FOR PROCEDURE:  [name] is a 71 year old female with extremely complex history involving this left knee from primary total knee arthroplasty revised for metallosis to ultimate presentation of failure of implants felt to be related to loosening.  She is noted to have significant bone stock loss.  For that reason, I put an articulating spacer so we could identify if there is any other implant design that would provide  some fixation particularly in the proximal tibia.  She is now about 4 months since that resection.  Risks of infection, DVT, component failure, need for future surgeries as well as what those options may be have been explicitly discussed. Consent was obtained for benefit of pain relief.  Please note also that [name] had a significant limited range of motion from the onset with arc of motion of only 20-40 degrees max.  PROCEDURE IN DETAIL:  The patient was brought to the operative theater. Once adequate anesthesia preoperative antibiotics, Ancef administered as well as 1 g of tranexamic acid and 10 mg of Decadron, she was positioned supine with a left thigh tourniquet placed.  The left lower extremity was then prepped and draped in sterile fashion using DeMayo leg holder. Time-out was performed identifying the patient, planned procedure and extremity.  Leg was exsanguinated.  Tourniquet elevated to 250 mmHg. The patient's old incision was excised, soft tissue planes created.  A median arthrotomy was then created.  This revealed a clear synovial fluid that was slightly blood tinged, no signs of infection.  Once I made the arthrotomy, the soft tissue exposure was carried out including a quad snip based on her stiffness, this  was done at her last two operations.  The knee was exposed.  Once it was exposed, cemented articulating spacers were removed without difficulty.  At this point, I debrided the proximal tibia and distal femur.  I have focused now on the tibial preparation.  Once I debrided the proximal tibia, we hand reamed to 16 mm, but bypassed where we had done previously to 220 mm versus 160.  At this point, I tried multiple attempts to try and prepare her bone for a proximal tibial condyle; however, the bone stock deficiency medial and posterior prevented any fixation for this.  At this point, I left the trial in for rotation purposes.  We had removed the previous  size 3 LEGION femoral component.  For this reason, we used the same size on the femoral side.  I prepared again in bypass, previously placed a 160-mm stem and used a 220-mm press- fit stem.  I was able on the femoral side to place and prepare for a sleeve in the metaphysis.  This was reamed and then broached for 22-mm sleeve.  Trial reduction was carried out with a 3 femoral component with the aforementioned stem and sleeve.  We selected 15-mm augments based on the revision components. This restored her joint line.  With the trial components were placed, size 11-mm insert allowed for full knee extension.  Flexion was significantly limited based on her preoperative motion.  At this point, all trial components were removed.  We irrigated the canal throughout the case with pulse lavage, canal brush irrigator. Final components were selected and opened and configured on the back table by myself.  Then, cement was mixed.  We did inject the synovial-capsular junction of the knee particularly in the medial side of the joint with 0.25% Marcaine with epinephrine 30 mL with 1 mL of Toradol and 30 mL of normal saline.  Once the cement was mixed, I cemented the distal aspect of the femur and then press-fit the component to sat down on the cut surfaces.  The proximal tibia was then cemented and impacted with the press-fit stem to its sitting onto the lateral proximal tibia.  Trial insert was then placed.  The knee was brought to extension.  We had previously prepared the patella for the 32-mm button.  The button was cemented in place and clamped.  The knee was held in full extension until cement had fully cured.  Once the cement cured, an excessive cement was removed throughout the knee, we selected an 11-mm constrained insert, it was then snapped into place on the tibial tray with visualized locking.  The knee was reduced, irrigated again.  The tourniquet had been let down after 102 minutes.   There was no significant hemostasis that was required.  At this point, I reapproximated the extensor mechanism including the quadriceps snip using the combination of #1 Vicryl and 0 V-Loc suture. The remainder of the wound was closed with 2-0 Vicryl and running 3-0 Monocryl.  The knee was cleaned, dried and dressed sterilely using surgical glue and Aquacel dressing.  Findings will be reviewed with her husband.  She will have a very limited range of motion as was predicted by her preoperative motion.  I will have her be partial weightbearing, do to the use of press-fit stems.  As has been discussed explicitly with [name], this most likely be the last knee replacement procedure that she could have that would reliably provide any benefit to her.  My hope is that she  will get as much longevity out of it as possible.     Madlyn Frankel Charlann Boxer, M.D.     MDO/MEDQ  D:  03/17/2017  T:  03/17/2017  Job:  [id number]

## 2017-03-18 NOTE — Progress Notes (Signed)
Patient ID: [name], female   DOB: [birth year], 71 y.o.   MRN: [id number] Subjective: 1 Day Post-Op Procedure(s) (LRB): REIMPLANTATION OF LEFT TOTAL KNEE Arthroplasty (Left)    Patient reports pain as moderate but relatively comfortable.  No events overnight.  Some report of soft blood pressure at times  Objective:   VITALS:   Vitals:   03/18/17 1008 03/18/17 1310  BP: (!) 100/48 (!) 121/59  Pulse: (!) 57 (!) 58  Resp: 15 16  Temp: 98 F (36.7 C) (!) 97.5 F (36.4 C)    Neurovascular intact Incision: dressing C/D/I  LABS  Recent Labs  03/18/17 0653  HGB 9.5*  HCT 29.7*  WBC 10.1  PLT 190     Recent Labs  03/18/17 0530  NA 134*  K 5.0  BUN 16  CREATININE 1.12*  GLUCOSE 165*    No results for input(s): LABPT, INR in the last 72 hours.   Assessment/Plan: 1 Day Post-Op Procedure(s) (LRB): REIMPLANTATION OF LEFT TOTAL KNEE Arthroplasty (Left)   Advance diet Up with therapy Discharge home with home health  If progresses well, PWB LLE then will consider d/c to home tomorrow Reviewed findings and post-op course

## 2017-03-18 NOTE — Progress Notes (Signed)
Physical Therapy Treatment Patient Details Name: [name] H Speigner MRN: [id number] DOB: 1945/10/26 Today's Date: 03/18/2017    History of Present Illness 71 yo female s/p L TK revision 03/17/17. Hx of legally blind, multiple surgeries L knee with last being a resection with abx spacer placement 10/2016.     PT Comments    POD # 1 am session Assisted OOB to amb in hallway then returned to room to perform some TKR TE's followed by ICE.    Follow Up Recommendations  Home health PT;Supervision/Assistance - 24 hour     Equipment Recommendations  None recommended by PT    Recommendations for Other Services       Precautions / Restrictions Precautions Precautions: Fall;Knee Precaution Comments: no pillow under knee Restrictions Weight Bearing Restrictions: Yes LLE Weight Bearing: Partial weight bearing LLE Partial Weight Bearing Percentage or Pounds: 50%    Mobility  Bed Mobility Overal bed mobility: Needs Assistance Bed Mobility: Supine to Sit     Supine to sit: Min assist     General bed mobility comments: Assist for L LE. Increased time. VCs technique.   Transfers Overall transfer level: Needs assistance Equipment used: Rolling walker (2 wheeled) Transfers: Sit to/from Stand Sit to Stand: Min assist;Min guard         General transfer comment: VCs safety, technique, hand/LE placement. Assist to rise, stabilize, hand/LE placement.   VC's for direction due to low vision  Ambulation/Gait Ambulation/Gait assistance: Min guard;Min assist Ambulation Distance (Feet): 35 Feet Assistive device: Rolling walker (2 wheeled) Gait Pattern/deviations: Step-to pattern;Decreased stance time - left Gait velocity: decreased   General Gait Details: VCs safety, technique, sequence, adherence to PWB status. Assist to stabilize pt and maneuver safely with RW. Pt has difficulty due to vision deficits.    Stairs            Wheelchair Mobility    Modified Rankin (Stroke  Patients Only)       Balance                                            Cognition Arousal/Alertness: Awake/alert Behavior During Therapy: WFL for tasks assessed/performed Overall Cognitive Status: Within Functional Limits for tasks assessed                                        Exercises   Total Knee Replacement TE's 10 reps B LE ankle pumps 10 reps towel squeezes 10 reps knee presses 10 reps heel slides  10 reps SAQ's 10 reps SLR's 10 reps ABD Followed by ICE     General Comments        Pertinent Vitals/Pain Pain Assessment: No/denies pain Pain Location: L knee with activity Pain Descriptors / Indicators: Sore;Operative site guarding;Tender Pain Intervention(s): Monitored during session;Repositioned;Ice applied    Home Living                      Prior Function            PT Goals (current goals can now be found in the care plan section) Progress towards PT goals: Progressing toward goals    Frequency           PT Plan Current plan remains appropriate    Co-evaluation  AM-PAC PT "6 Clicks" Daily Activity  Outcome Measure  Difficulty turning over in bed (including adjusting bedclothes, sheets and blankets)?: Total Difficulty moving from lying on back to sitting on the side of the bed? : Total Difficulty sitting down on and standing up from a chair with arms (e.g., wheelchair, bedside commode, etc,.)?: Total Help needed moving to and from a bed to chair (including a wheelchair)?: A Little Help needed walking in hospital room?: A Little Help needed climbing 3-5 steps with a railing? : A Lot 6 Click Score: 11    End of Session Equipment Utilized During Treatment: Gait belt Activity Tolerance: Patient tolerated treatment well Patient left: in chair;with call bell/phone within reach   PT Visit Diagnosis: Muscle weakness (generalized) (M62.81);Difficulty in walking, not elsewhere  classified (R26.2)     Time: [id number] PT Time Calculation (min) (ACUTE ONLY): 28 min  Charges:  $Gait Training: 8-22 mins $Therapeutic Exercise: 8-22 mins                    G Codes:       Felecia ShellingLori Medrith Veillon  PTA [facility]  Acute  Rehab Pager      [phone]

## 2017-03-18 NOTE — Progress Notes (Signed)
PHARMACY NOTE: CITALOPRAM DOSAGE  Orders received to continue citalopram 40 mg daily as taken prior to admission.  FDA Alert and updated prescribing information recommends limiting citalopram dosage to 20mg  daily when age > 60 due to increased risk of QTc prolongation and life-threatening arrhythmias.   QTc on preoperative EKG 03/11/17 was 445 msec (WNL).  Recommend: Consider risk vs. benefit of continuing present citalopram dosage.  Elie Goodyandy Amilee Janvier, PharmD, BCPS Pager: [phone] 03/18/2017  5:18 AM

## 2017-03-18 NOTE — Progress Notes (Signed)
OT Cancellation Note  Patient Details Name: [name] H Supan MRN: [id number] DOB: June 20, 1946   Cancelled Treatment:    Reason Eval/Treat Not Completed: OT screened, no needs identified, will sign off   Spoke with pt. Husband and daughter will A with ADL activity. No OT or DME needs  Lise AuerLori Tyronica [place], [place] [phone]  Einar CrowEDDING, Daine Croker D 03/18/2017, 11:40 AM

## 2017-03-18 NOTE — Progress Notes (Signed)
Discharge planning, spoke with patient at beside. [name] Has RW and 3-n-1. [phone]

## 2017-03-19 DIAGNOSIS — E669 Obesity, unspecified: Secondary | ICD-10-CM | POA: Diagnosis present

## 2017-03-19 LAB — CBC
HEMATOCRIT: 27.9 % — AB (ref 36.0–46.0)
HEMOGLOBIN: 8.9 g/dL — AB (ref 12.0–15.0)
MCH: 28.1 pg (ref 26.0–34.0)
MCHC: 31.9 g/dL (ref 30.0–36.0)
MCV: 88 fL (ref 78.0–100.0)
Platelets: 183 10*3/uL (ref 150–400)
RBC: 3.17 MIL/uL — ABNORMAL LOW (ref 3.87–5.11)
RDW: 15.8 % — AB (ref 11.5–15.5)
WBC: 10.1 10*3/uL (ref 4.0–10.5)

## 2017-03-19 LAB — BASIC METABOLIC PANEL
Anion gap: 7 (ref 5–15)
BUN: 18 mg/dL (ref 6–20)
CALCIUM: 8.8 mg/dL — AB (ref 8.9–10.3)
CHLORIDE: 101 mmol/L (ref 101–111)
CO2: 29 mmol/L (ref 22–32)
CREATININE: 1 mg/dL (ref 0.44–1.00)
GFR calc Af Amer: >60 mL/min (ref >60)
GFR calc non Af Amer: 55 mL/min — ABNORMAL LOW (ref >60)
GLUCOSE: 142 mg/dL — AB (ref 65–99)
Potassium: 5 mmol/L (ref 3.5–5.1)
Sodium: 137 mmol/L (ref 135–145)

## 2017-03-19 MED ORDER — HYDROCODONE-ACETAMINOPHEN 7.5-325 MG PO TABS: 1.0000 | ORAL_TABLET | ORAL | 0 refills | Status: DC | PRN

## 2017-03-19 MED ORDER — POLYETHYLENE GLYCOL 3350 17 G PO PACK: 17.0000 g | PACK | Freq: Two times a day (BID) | ORAL | 0 refills | Status: DC

## 2017-03-19 MED ORDER — FERROUS SULFATE 325 (65 FE) MG PO TABS: 325.0000 mg | ORAL_TABLET | Freq: Three times a day (TID) | ORAL | 3 refills | Status: DC

## 2017-03-19 MED ORDER — METHOCARBAMOL 500 MG PO TABS: 500.0000 mg | ORAL_TABLET | Freq: Four times a day (QID) | ORAL | 0 refills | Status: DC | PRN

## 2017-03-19 MED ORDER — DOCUSATE SODIUM 100 MG PO CAPS: 100.0000 mg | ORAL_CAPSULE | Freq: Two times a day (BID) | ORAL | 0 refills | Status: DC

## 2017-03-19 NOTE — Progress Notes (Signed)
     Subjective: 2 Days Post-Op Procedure(s) (LRB): REIMPLANTATION OF LEFT TOTAL KNEE Arthroplasty (Left)   Patient reports pain as mild, pain controlled.  Discussed her PWB status.  Feels that she is doing well.  Ready to be discharged home if she does well with PT.   Objective:   VITALS:   Vitals:   03/19/17 0524 03/19/17 0858  BP: 121/62 (!) 121/59  Pulse: 60 63  Resp: 16   Temp: 97.8 F (36.6 C)     Dorsiflexion/Plantar flexion intact Incision: dressing C/D/I No cellulitis present Compartment soft  LABS  Recent Labs  03/18/17 0653 03/19/17 0625  HGB 9.5* 8.9*  HCT 29.7* 27.9*  WBC 10.1 10.1  PLT 190 183     Recent Labs  03/18/17 0530 03/19/17 0625  NA 134* 137  K 5.0 5.0  BUN 16 18  CREATININE 1.12* 1.00  GLUCOSE 165* 142*     Assessment/Plan: 2 Days Post-Op Procedure(s) (LRB): REIMPLANTATION OF LEFT TOTAL KNEE Arthroplasty (Left) Up with therapy Discharge home with home health [facility]. Follow up with OLIN,Tyronza Happe D in 2 weeks.  Contact information:  [facility] [address], [address] [place] [id number] [phone]    Obese (BMI 30-39.9) Estimated body mass index is 35.96 kg/m as calculated from the following:   Height as of this encounter: 5\' 3"  (1.6 m).   Weight as of this encounter: 92.1 kg (203 lb). Patient also counseled that weight may inhibit the healing process Patient counseled that losing weight will help with future health issues      Anastasio AuerbachMatthew S. Jervon Ream   PAC  03/19/2017, 9:42 AM

## 2017-03-19 NOTE — Progress Notes (Signed)
Physical Therapy Treatment Patient Details Name: [name] H Paske MRN: [id number] DOB: 1945/10/06 Today's Date: 03/19/2017    History of Present Illness 71 yo female s/p L TK revision 03/17/17. Hx of legally blind, multiple surgeries L knee with last being a resection with abx spacer placement 10/2016.     PT Comments    POD # 2 Returned to complete session.  Performed all supine TKR TE's following handout.  Instructed on proper tech, freq as well as use of ice.  Addressed all mobility questions.  Pt ready for D/C to home   Follow Up Recommendations  Home health PT;Supervision/Assistance - 24 hour     Equipment Recommendations  None recommended by PT    Recommendations for Other Services       Precautions / Restrictions Precautions Precaution Comments: no pillow under knee Restrictions Weight Bearing Restrictions: Yes LLE Partial Weight Bearing Percentage or Pounds: 50%       Balance                                            Cognition                                              Exercises   Total Knee Replacement TE's 10 reps B LE ankle pumps 10 reps towel squeezes 10 reps knee presses 10 reps heel slides  10 reps SAQ's 10 reps SLR's 10 reps ABD Followed by ICE    General Comments        Pertinent Vitals/Pain Pain Assessment: 0-10 Pain Score: 5  Pain Location: L knee with activity Pain Descriptors / Indicators: Sore;Operative site guarding;Tender Pain Intervention(s): Monitored during session;Premedicated before session;Repositioned;Ice applied    Home Living                      Prior Function            PT Goals (current goals can now be found in the care plan section) Progress towards PT goals: Progressing toward goals    Frequency    7X/week      PT Plan Current plan remains appropriate    Co-evaluation              AM-PAC PT "6 Clicks" Daily Activity  Outcome Measure  Difficulty  turning over in bed (including adjusting bedclothes, sheets and blankets)?: Total Difficulty moving from lying on back to sitting on the side of the bed? : Total Difficulty sitting down on and standing up from a chair with arms (e.g., wheelchair, bedside commode, etc,.)?: Total Help needed moving to and from a bed to chair (including a wheelchair)?: A Little Help needed walking in hospital room?: A Little Help needed climbing 3-5 steps with a railing? : A Lot 6 Click Score: 11    End of Session Equipment Utilized During Treatment: Gait belt Activity Tolerance: Patient tolerated treatment well Patient left: in chair;with call bell/phone within reach Nurse Communication: Mobility status PT Visit Diagnosis: Muscle weakness (generalized) (M62.81);Difficulty in walking, not elsewhere classified (R26.2)     Time: 4098-11911100-1125 PT Time Calculation (min) (ACUTE ONLY): 25 min  Charges:   $Therapeutic Exercise: 8-22 mins $Self Care/Home Management: 8-22  G Codes:       Rica Koyanagi  PTA [facility]  Acute  Rehab Pager      [phone]

## 2017-03-19 NOTE — Progress Notes (Signed)
Physical Therapy Treatment Patient Details Name: [name] MRN: [id number] DOB: Jul 14, 1946 Today's Date: 03/19/2017    History of Present Illness 71 yo female s/p L TK revision 03/17/17. Hx of legally blind, multiple surgeries L knee with last being a resection with abx spacer placement 10/2016.     PT Comments    POD # 2 am session Assisted with amb a greater distance in hallway however after 50 feet pt began to c/o MAX fatigue with B knees buckling.  RN brought recliner.  Vitals taken.  All WNL.  Will allow pt to rest before completing session.   Pt plans to D/C to home today with spouse.   Follow Up Recommendations  Home health PT;Supervision/Assistance - 24 hour     Equipment Recommendations  None recommended by PT    Recommendations for Other Services       Precautions / Restrictions Precautions Precaution Comments: no pillow under knee Restrictions Weight Bearing Restrictions: Yes LLE Partial Weight Bearing Percentage or Pounds: 50%    Mobility  Bed Mobility               General bed mobility comments: OOB in recliner  Transfers Overall transfer level: Needs assistance Equipment used: Rolling walker (2 wheeled) Transfers: Sit to/from Stand Sit to Stand: Supervision;Min guard         General transfer comment: increased time and VC's for safety with turn completion  Ambulation/Gait Ambulation/Gait assistance: Supervision;Min guard Ambulation Distance (Feet): 50 Feet Assistive device: Rolling walker (2 wheeled) Gait Pattern/deviations: Step-to pattern;Decreased stance time - left Gait velocity: decreased   General Gait Details: after 50 feet pt started to c/o "a lot" of weakness and recliner brought to her.     Stairs Stairs:  (no stairs to enter home)          Wheelchair Mobility    Modified Rankin (Stroke Patients Only)       Balance                                            Cognition                                              Exercises      General Comments        Pertinent Vitals/Pain Pain Assessment: 0-10 Pain Score: 5  Pain Location: L knee with activity Pain Descriptors / Indicators: Sore;Operative site guarding;Tender Pain Intervention(s): Monitored during session;Premedicated before session;Repositioned;Ice applied    Home Living                      Prior Function            PT Goals (current goals can now be found in the care plan section) Progress towards PT goals: Progressing toward goals    Frequency    7X/week      PT Plan Current plan remains appropriate    Co-evaluation              AM-PAC PT "6 Clicks" Daily Activity  Outcome Measure  Difficulty turning over in bed (including adjusting bedclothes, sheets and blankets)?: Total Difficulty moving from lying on back to sitting on the side of the bed? : Total Difficulty sitting  down on and standing up from a chair with arms (e.g., wheelchair, bedside commode, etc,.)?: Total Help needed moving to and from a bed to chair (including a wheelchair)?: A Little Help needed walking in hospital room?: A Little Help needed climbing 3-5 steps with a railing? : A Lot 6 Click Score: 11    End of Session Equipment Utilized During Treatment: Gait belt Activity Tolerance: Patient tolerated treatment well Patient left: in chair;with call bell/phone within reach Nurse Communication: Mobility status PT Visit Diagnosis: Muscle weakness (generalized) (M62.81);Difficulty in walking, not elsewhere classified (R26.2)     Time: 4098-11910955-1020 PT Time Calculation (min) (ACUTE ONLY): 25 min  Charges:  $Gait Training: 8-22 mins $Therapeutic Activity: 8-22 mins                    G Codes:       {Shamel Galyean  PTA [facility]  Acute  Rehab Pager      [phone]

## 2017-03-21 LAB — BPAM RBC
BLOOD PRODUCT EXPIRATION DATE: 201808252359
ISSUE DATE / TIME: 201807231728
Unit Type and Rh: 9500

## 2017-03-21 LAB — TYPE AND SCREEN
ABO/RH(D): B POS
ANTIBODY SCREEN: POSITIVE
DAT, IgG: POSITIVE
DONOR AG TYPE: NEGATIVE
UNIT DIVISION: 0
Weak D: POSITIVE

## 2017-03-24 NOTE — Discharge Summary (Signed)
Physician Discharge Summary  Patient ID: [name] MRN: [id number] DOB/AGE: 1946-01-30 71 y.o.  Admit date: 03/17/2017 Discharge date: 03/19/2017   Procedures:  Procedure(s) (LRB): REIMPLANTATION OF LEFT TOTAL KNEE Arthroplasty (Left)  Attending Physician:  Dr. Durene Romans   Admission Diagnoses:   Failed left TKA  Discharge Diagnoses:  Principal Problem:   S/P revision of total knee, left Active Problems:   Obese  Past Medical History:  Diagnosis Date  . Anxiety   . Arthritis   . CHF (congestive heart failure) (HCC)    diastolic Hf  . Dyspnea    at times  . Dysrhythmia    a fib  . Family history of adverse reaction to anesthesia    Daughter hard to wake up  . GERD (gastroesophageal reflux disease)   . Headache    hx of  . History of hiatal hernia   . History of kidney stones   . Hyperlipidemia   . Hypertension   . Hypothyroidism   . Palpitations   . Pneumonia   . Restless leg syndrome   . Retinitis pigmentosa     HPI:    [name], 71 y.o. female, has a history of pain and functional disability in the left knee(s) due to failed previous arthroplasty and patient has failed non-surgical conservative treatments for greater than 12 weeks to include NSAID's and/or analgesics, use of assistive devices, weight reduction as appropriate, activity modification and antibiotics. The indications for the revision of the total knee arthroplasty are failure / infection.   Prior procedures on the left knee(s) include arthroplasty and resection and placement of antibiotic spacer. Patient currently rates pain in the left knee(s) at 7 out of 10 with activity. There is night pain, worsening of pain with activity and weight bearing, pain that interferes with activities of daily living and pain with passive range of motion.   This condition presents safety issues increasing the risk of falls. There is no current active infection.  Risks, benefits and expectations were discussed  with the patient.  Risks including but not limited to the risk of anesthesia, blood clots, nerve damage, blood vessel damage, failure of the prosthesis, infection and up to and including death.  Patient understand the risks, benefits and expectations and wishes to proceed with surgery.   PCP: Jill Mackintosh, MD   Discharged Condition: good  Hospital Course:  Patient underwent the above stated procedure on 03/17/2017. Patient tolerated the procedure well and brought to the recovery room in good condition and subsequently to the floor.  POD #1 BP: 121/59 ; Pulse: 58 ; Temp: 97.5 F (36.4 C) ; Resp: 16 Patient reports pain as moderate but relatively comfortable.  No events overnight.  Some report of soft blood pressure at times. Neurovascular intact and incision: dressing C/D/I.  LABS  Basename    HGB     9.5  HCT     29.7   POD #2  BP: 121/59 ; Pulse: 63 ; Temp: 97.8 F (36.6 C) ; Resp: 16 Patient reports pain as mild, pain controlled.  Discussed her PWB status.  Feels that she is doing well.  Ready to be discharged home. Dorsiflexion/plantar flexion intact, incision: dressing C/D/I, no cellulitis present and compartment soft.   LABS  Basename    HGB     8.9  HCT     27.9    Discharge Exam: General appearance: alert, cooperative and no distress Extremities: Homans sign is negative, no sign of DVT, no  edema, redness or tenderness in the calves or thighs and no ulcers, gangrene or trophic changes  Disposition: Home with follow up in 2 weeks   Follow-up Information    [facility] Follow up.   Specialty:  Home Health Services Why:  physical therapy Contact information: [address] [place] [address] [phone]        Durene Romans, MD. Schedule an appointment as soon as possible for a visit in 2 week(s).   Specialty:  Orthopedic Surgery Contact information: [address] [place] [address] [phone]            Discharge Instructions    Call MD / Call 911    Complete by:  As directed    If you experience chest pain or shortness of breath, CALL 911 and be transported to the hospital emergency room.  If you develope a fever above 101 F, pus (white drainage) or increased drainage or redness at the wound, or calf pain, call your surgeon's office.   Change dressing    Complete by:  As directed    Maintain surgical dressing until follow up in the clinic. If the edges start to pull up, may reinforce with tape. If the dressing is no longer working, may remove and cover with gauze and tape, but must keep the area dry and clean.  Call with any questions or concerns.   Constipation Prevention    Complete by:  As directed    Drink plenty of fluids.  Prune juice may be helpful.  You may use a stool softener, such as Colace (over the counter) 100 mg twice a day.  Use MiraLax (over the counter) for constipation as needed.   Diet - low sodium heart healthy    Complete by:  As directed    Discharge instructions    Complete by:  As directed    Maintain surgical dressing until follow up in the clinic. If the edges start to pull up, may reinforce with tape. If the dressing is no longer working, may remove and cover with gauze and tape, but must keep the area dry and clean.  Follow up in 2 weeks at [facility]. Call with any questions or concerns.   Partial weight bearing    Complete by:  As directed    % Body Weight:  50   Laterality:  left   Extremity:  Lower   TED hose    Complete by:  As directed    Use stockings (TED hose) for 2 weeks on both leg(s).  You may remove them at night for sleeping.      Allergies as of 03/19/2017      Reactions   Benadryl [diphenhydramine Hcl] Hives   Septra [sulfamethoxazole-trimethoprim] Hives   Other Rash   Metal      Medication List    TAKE these medications   amiodarone 200 MG tablet Commonly known as:  PACERONE Take 200 mg by mouth daily at 10 pm.  2100   cetirizine 10 MG tablet Commonly known as:  ZYRTEC Take 10 mg by mouth daily as needed (for allergies.).   citalopram 40 MG tablet Commonly known as:  CELEXA Take 40 mg by mouth every evening.   docusate sodium 100 MG capsule Commonly known as:  COLACE Take 1 capsule (100 mg total) by mouth 2 (two) times daily.   ferrous sulfate 325 (65 FE) MG tablet Take 1 tablet (325 mg total) by mouth 3 (  three) times daily after meals.   furosemide 20 MG tablet Commonly known as:  LASIX Take 20 mg by mouth daily as needed for fluid or edema.   gabapentin 600 MG tablet Commonly known as:  NEURONTIN Take 600 mg by mouth at bedtime.   HYDROcodone-acetaminophen 7.5-325 MG tablet Commonly known as:  NORCO Take 1-2 tablets by mouth every 4 (four) hours as needed for moderate pain.   levothyroxine 100 MCG tablet Commonly known as:  SYNTHROID, LEVOTHROID Take 50 mcg by mouth daily before breakfast. 3350mcg+56 mcg=106 mcg daily   levothyroxine 112 MCG tablet Commonly known as:  SYNTHROID, LEVOTHROID Take 56 mcg by mouth daily before breakfast. 6950mcg+56 mcg=106 mcg daily   methocarbamol 500 MG tablet Commonly known as:  ROBAXIN Take 1 tablet (500 mg total) by mouth every 6 (six) hours as needed for muscle spasms.   metoprolol succinate 50 MG 24 hr tablet Commonly known as:  TOPROL-XL Take 50 mg by mouth 2 (two) times daily. (0900 & 2100) Take with or immediately following a meal.   pantoprazole 40 MG tablet Commonly known as:  PROTONIX Take 40 mg by mouth daily.   polyethylene glycol packet Commonly known as:  MIRALAX / GLYCOLAX Take 17 g by mouth 2 (two) times daily.   potassium chloride 10 MEQ tablet Commonly known as:  K-DUR,KLOR-CON Take 10 mEq by mouth every other day.   risperiDONE 0.5 MG tablet Commonly known as:  RISPERDAL Take 0.5 mg by mouth 2 (two) times daily.   rivaroxaban 20 MG Tabs tablet Commonly known as:  XARELTO Take 20 mg by mouth at bedtime. (2100)    rOPINIRole 2 MG tablet Commonly known as:  REQUIP Take 2 mg by mouth 4 (four) times daily.        Signed: Anastasio AuerbachMatthew S. Lachlan Mckim   PA-C  03/24/2017, 12:04 PM

## 2017-11-24 DIAGNOSIS — G629 Polyneuropathy, unspecified: Secondary | ICD-10-CM | POA: Insufficient documentation

## 2018-09-19 HISTORY — PX: ORIF WRIST FRACTURE: SHX2133

## 2018-10-09 DIAGNOSIS — D649 Anemia, unspecified: Secondary | ICD-10-CM | POA: Insufficient documentation

## 2018-11-14 ENCOUNTER — Other Ambulatory Visit: Payer: Self-pay | Admitting: Internal Medicine

## 2018-12-10 NOTE — Progress Notes (Signed)
Please place orders in Epic as patient is being scheduled for a pre-op appointment!  Patient 's  surgery is 12/15/2018 and DOB is 1946-08-15. Thank you!

## 2018-12-11 NOTE — Patient Instructions (Signed)
[name]  12/11/2018   Your procedure is scheduled on: 12-15-18    Report to [facility] Main  Entrance    Report to Short Stay at 5:30 AM    Call this number if you have problems the morning of surgery [phone]    Remember: NO SOLID FOOD AFTER MIDNIGHT THE NIGHT PRIOR TO SURGERY. NOTHING BY MOUTH EXCEPT CLEAR LIQUIDS UNTIL 3 HOURS PRIOR TO SCHEDULED SURGERY. PLEASE FINISH ENSURE DRINK PER SURGEON ORDER 3 HOURS PRIOR TO SCHEDULED SURGERY TIME WHICH NEEDS TO BE COMPLETED AT 4:30 AM.   CLEAR LIQUID DIET   Foods Allowed                                                                     Foods Excluded  Coffee and tea, regular and decaf                             liquids that you cannot  Plain Jell-O in any flavor                                             see through such as: Fruit ices (not with fruit pulp)                                     milk, soups, orange juice  Iced Popsicles                                    All solid food Carbonated beverages, regular and diet                                    Cranberry, grape and apple juices Sports drinks like Gatorade Lightly seasoned clear broth or consume(fat free) Sugar, honey syrup  Sample Menu Breakfast                                Lunch                                     Supper Cranberry juice                    Beef broth                            Chicken broth Jell-O                                     Grape juice  Apple juice Coffee or tea                        Jell-O                                      Popsicle                                                Coffee or tea                        Coffee or tea  _____________________________________________________________________    BRUSH YOUR TEETH MORNING OF SURGERY AND RINSE YOUR MOUTH OUT, NO CHEWING GUM CANDY OR MINTS.     Take these medicines the morning of surgery with A SIP OF WATER: Atenolol  (Tenormin),  Citalopram (Celexa), and Pantoprazole (Protonix)                                You may not have any metal on your body including hair pins and              piercings  Do not wear jewelry, make-up, lotions, powders or perfumes, deodorant             Do not wear nail polish.  Do not shave  48 hours prior to surgery.              Do not bring valuables to the hospital. [facility] IS NOT             RESPONSIBLE   FOR VALUABLES.  Contacts, dentures or bridgework may not be worn into surgery.  Leave suitcase in the car. After surgery it may be brought to your room.     Patients discharged the day of surgery will not be allowed to drive home. IF YOU ARE HAVING SURGERY AND GOING HOME THE SAME DAY, YOU MUST HAVE AN ADULT TO DRIVE YOU HOME AND BE WITH YOU FOR 24 HOURS. YOU MAY GO HOME BY TAXI OR UBER OR ORTHERWISE, BUT AN ADULT MUST ACCOMPANY YOU HOME AND STAY WITH YOU FOR 24 HOURS.  Name and phone number of your driver:  Special Instructions: N/A              Please read over the following fact sheets you were given: _____________________________________________________________________             [facility] Pre[facility] Before surgery, you can play an important role.  Because skin is not sterile, your skin needs to be as free of germs as possible.  You can reduce the number of germs on your skin by washing with CHG (chlorahexidine gluconate) soap before surgery.  CHG is an antiseptic cleaner which kills germs and bonds with the skin to continue killing germs even after washing. Please DO NOT use if you have an allergy to CHG or antibacterial soaps.  If your skin becomes reddened/irritated stop using the CHG and inform your nurse when you arrive at Short Stay. Do not shave (including legs and underarms) for at least 48 hours prior to the first CHG shower.  You may shave your face/neck. Please  follow these instructions carefully:  1.  Shower with CHG Soap the night before  surgery and the  morning of Surgery.  2.  If you choose to wash your hair, wash your hair first as usual with your  normal  shampoo.  3.  After you shampoo, rinse your hair and body thoroughly to remove the  shampoo.                           4.  Use CHG as you would any other liquid soap.  You can apply chg directly  to the skin and wash                       Gently with a scrungie or clean washcloth.  5.  Apply the CHG Soap to your body ONLY FROM THE NECK DOWN.   Do not use on face/ open                           Wound or open sores. Avoid contact with eyes, ears mouth and genitals (private parts).                       Wash face,  Genitals (private parts) with your normal soap.             6.  Wash thoroughly, paying special attention to the area where your surgery  will be performed.  7.  Thoroughly rinse your body with warm water from the neck down.  8.  DO NOT shower/wash with your normal soap after using and rinsing off  the CHG Soap.                9.  Pat yourself dry with a clean towel.            10.  Wear clean pajamas.            11.  Place clean sheets on your bed the night of your first shower and do not  sleep with pets. Day of Surgery : Do not apply any lotions/deodorants the morning of surgery.  Please wear clean clothes to the hospital/surgery center.  FAILURE TO FOLLOW THESE INSTRUCTIONS MAY RESULT IN THE CANCELLATION OF YOUR SURGERY PATIENT SIGNATURE_________________________________  NURSE SIGNATURE__________________________________  ________________________________________________________________________   [name]  An incentive spirometer is a tool that can help keep your lungs clear and active. This tool measures how well you are filling your lungs with each breath. Taking long deep breaths may help reverse or decrease the chance of developing breathing (pulmonary) problems (especially infection) following:  A long period of time when you are unable to  move or be active. BEFORE THE PROCEDURE   If the spirometer includes an indicator to show your best effort, your nurse or respiratory therapist will set it to a desired goal.  If possible, sit up straight or lean slightly forward. Try not to slouch.  Hold the incentive spirometer in an upright position. INSTRUCTIONS FOR USE  1. Sit on the edge of your bed if possible, or sit up as far as you can in bed or on a chair. 2. Hold the incentive spirometer in an upright position. 3. Breathe out normally. 4. Place the mouthpiece in your mouth and seal your lips tightly around it. 5. Breathe in slowly and as deeply as possible, raising the piston or the ball  toward the top of the column. 6. Hold your breath for 3-5 seconds or for as long as possible. Allow the piston or ball to fall to the bottom of the column. 7. Remove the mouthpiece from your mouth and breathe out normally. 8. Rest for a few seconds and repeat Steps 1 through 7 at least 10 times every 1-2 hours when you are awake. Take your time and take a few normal breaths between deep breaths. 9. The spirometer may include an indicator to show your best effort. Use the indicator as a goal to work toward during each repetition. 10. After each set of 10 deep breaths, practice coughing to be sure your lungs are clear. If you have an incision (the cut made at the time of surgery), support your incision when coughing by placing a pillow or rolled up towels firmly against it. Once you are able to get out of bed, walk around indoors and cough well. You may stop using the incentive spirometer when instructed by your caregiver.  RISKS AND COMPLICATIONS  Take your time so you do not get dizzy or light-headed.  If you are in pain, you may need to take or ask for pain medication before doing incentive spirometry. It is harder to take a deep breath if you are having pain. AFTER USE  Rest and breathe slowly and easily.  It can be helpful to keep track of  a log of your progress. Your caregiver can provide you with a simple table to help with this. If you are using the spirometer at home, follow these instructions: [place] IF:   You are having difficultly using the spirometer.  You have trouble using the spirometer as often as instructed.  Your pain medication is not giving enough relief while using the spirometer.  You develop fever of 100.5 F (38.1 C) or higher. SEEK IMMEDIATE MEDICAL CARE IF:   You cough up bloody sputum that had not been present before.  You develop fever of 102 F (38.9 C) or greater.  You develop worsening pain at or near the incision site. MAKE SURE YOU:   Understand these instructions.  Will watch your condition.  Will get help right away if you are not doing well or get worse. Document Released: 12/16/2006 Document Revised: 10/28/2011 Document Reviewed: 02/16/2007 ExitCare Patient Information 2014 ExitCare, [place].   ________________________________________________________________________  WHAT IS A BLOOD TRANSFUSION? Blood Transfusion Information  A transfusion is the replacement of blood or some of its parts. Blood is made up of multiple cells which provide different functions.  Red blood cells carry oxygen and are used for blood loss replacement.  White blood cells fight against infection.  Platelets control bleeding.  Plasma helps clot blood.  Other blood products are available for specialized needs, such as hemophilia or other clotting disorders. BEFORE THE TRANSFUSION  Who gives blood for transfusions?   Healthy volunteers who are fully evaluated to make sure their blood is safe. This is blood bank blood. Transfusion therapy is the safest it has ever been in the practice of medicine. Before blood is taken from a donor, a complete history is taken to make sure that person has no history of diseases nor engages in risky social behavior (examples are intravenous drug use or sexual  activity with multiple partners). The donor's travel history is screened to minimize risk of transmitting infections, such as malaria. The donated blood is tested for signs of infectious diseases, such as HIV and hepatitis. The blood is  then tested to be sure it is compatible with you in order to minimize the chance of a transfusion reaction. If you or a relative donates blood, this is often done in anticipation of surgery and is not appropriate for emergency situations. It takes many days to process the donated blood. RISKS AND COMPLICATIONS Although transfusion therapy is very safe and saves many lives, the main dangers of transfusion include:   Getting an infectious disease.  Developing a transfusion reaction. This is an allergic reaction to something in the blood you were given. Every precaution is taken to prevent this. The decision to have a blood transfusion has been considered carefully by your caregiver before blood is given. Blood is not given unless the benefits outweigh the risks. AFTER THE TRANSFUSION  Right after receiving a blood transfusion, you will usually feel much better and more energetic. This is especially true if your red blood cells have gotten low (anemic). The transfusion raises the level of the red blood cells which carry oxygen, and this usually causes an energy increase.  The nurse administering the transfusion will monitor you carefully for complications. HOME CARE INSTRUCTIONS  No special instructions are needed after a transfusion. You may find your energy is better. Speak with your caregiver about any limitations on activity for underlying diseases you may have. SEEK MEDICAL CARE IF:   Your condition is not improving after your transfusion.  You develop redness or irritation at the intravenous (IV) site. SEEK IMMEDIATE MEDICAL CARE IF:  Any of the following symptoms occur over the next 12 hours:  Shaking chills.  You have a temperature by mouth above 102 F  (38.9 C), not controlled by medicine.  Chest, back, or muscle pain.  People around you feel you are not acting correctly or are confused.  Shortness of breath or difficulty breathing.  Dizziness and fainting.  You get a rash or develop hives.  You have a decrease in urine output.  Your urine turns a dark color or changes to pink, red, or brown. Any of the following symptoms occur over the next 10 days:  You have a temperature by mouth above 102 F (38.9 C), not controlled by medicine.  Shortness of breath.  Weakness after normal activity.  The white part of the eye turns yellow (jaundice).  You have a decrease in the amount of urine or are urinating less often.  Your urine turns a dark color or changes to pink, red, or brown. Document Released: 08/02/2000 Document Revised: 10/28/2011 Document Reviewed: 03/21/2008 [facility] Patient Information 2014 [place], [place].  _______________________________________________________________________

## 2018-12-14 ENCOUNTER — Encounter (HOSPITAL_COMMUNITY): Payer: Self-pay

## 2018-12-14 ENCOUNTER — Other Ambulatory Visit: Payer: Self-pay

## 2018-12-14 ENCOUNTER — Encounter (HOSPITAL_COMMUNITY)
Admission: RE | Admit: 2018-12-14 | Discharge: 2018-12-14 | Disposition: A | Discharge status: Home / Self Care | Payer: Medicare Other | Source: Ambulatory Visit | Attending: Orthopedic Surgery | Admitting: Orthopedic Surgery

## 2018-12-14 DIAGNOSIS — Z79899 Other long term (current) drug therapy: Secondary | ICD-10-CM | POA: Insufficient documentation

## 2018-12-14 DIAGNOSIS — Z01818 Encounter for other preprocedural examination: Secondary | ICD-10-CM

## 2018-12-14 DIAGNOSIS — I272 Pulmonary hypertension, unspecified: Secondary | ICD-10-CM

## 2018-12-14 DIAGNOSIS — I480 Paroxysmal atrial fibrillation: Secondary | ICD-10-CM

## 2018-12-14 DIAGNOSIS — Z7901 Long term (current) use of anticoagulants: Secondary | ICD-10-CM | POA: Insufficient documentation

## 2018-12-14 DIAGNOSIS — F419 Anxiety disorder, unspecified: Secondary | ICD-10-CM

## 2018-12-14 DIAGNOSIS — K219 Gastro-esophageal reflux disease without esophagitis: Secondary | ICD-10-CM | POA: Insufficient documentation

## 2018-12-14 DIAGNOSIS — I110 Hypertensive heart disease with heart failure: Secondary | ICD-10-CM | POA: Insufficient documentation

## 2018-12-14 DIAGNOSIS — G2581 Restless legs syndrome: Secondary | ICD-10-CM | POA: Insufficient documentation

## 2018-12-14 DIAGNOSIS — E039 Hypothyroidism, unspecified: Secondary | ICD-10-CM

## 2018-12-14 DIAGNOSIS — E785 Hyperlipidemia, unspecified: Secondary | ICD-10-CM | POA: Insufficient documentation

## 2018-12-14 DIAGNOSIS — M1990 Unspecified osteoarthritis, unspecified site: Secondary | ICD-10-CM | POA: Insufficient documentation

## 2018-12-14 DIAGNOSIS — R590 Localized enlarged lymph nodes: Secondary | ICD-10-CM

## 2018-12-14 DIAGNOSIS — I5032 Chronic diastolic (congestive) heart failure: Secondary | ICD-10-CM | POA: Insufficient documentation

## 2018-12-14 HISTORY — DX: Unspecified atrial fibrillation: I48.91

## 2018-12-14 HISTORY — DX: Pulmonary hypertension, unspecified: I27.20

## 2018-12-14 HISTORY — DX: Legal blindness, as defined in USA: H54.8

## 2018-12-14 LAB — BASIC METABOLIC PANEL
Anion gap: 9 (ref 5–15)
BUN: 24 mg/dL — ABNORMAL HIGH (ref 8–23)
CO2: 27 mmol/L (ref 22–32)
Calcium: 8.6 mg/dL — ABNORMAL LOW (ref 8.9–10.3)
Chloride: 102 mmol/L (ref 98–111)
Creatinine, Ser: 1.16 mg/dL — ABNORMAL HIGH (ref 0.44–1.00)
GFR calc Af Amer: 54 mL/min — ABNORMAL LOW (ref >60)
GFR calc non Af Amer: 47 mL/min — ABNORMAL LOW (ref >60)
Glucose, Bld: 110 mg/dL — ABNORMAL HIGH (ref 70–99)
Potassium: 4.2 mmol/L (ref 3.5–5.1)
Sodium: 138 mmol/L (ref 135–145)

## 2018-12-14 LAB — PROTIME-INR
INR: 0.9 (ref 0.8–1.2)
Prothrombin Time: 12.4 seconds (ref 11.4–15.2)

## 2018-12-14 LAB — CBC
HCT: 38.4 % (ref 36.0–46.0)
Hemoglobin: 11.5 g/dL — ABNORMAL LOW (ref 12.0–15.0)
MCH: 28.2 pg (ref 26.0–34.0)
MCHC: 29.9 g/dL — ABNORMAL LOW (ref 30.0–36.0)
MCV: 94.1 fL (ref 80.0–100.0)
Platelets: 313 10*3/uL (ref 150–400)
RBC: 4.08 MIL/uL (ref 3.87–5.11)
RDW: 15.9 % — ABNORMAL HIGH (ref 11.5–15.5)
WBC: 10.9 10*3/uL — ABNORMAL HIGH (ref 4.0–10.5)
nRBC: 0 % (ref 0.0–0.2)

## 2018-12-14 LAB — TYPE AND SCREEN
ABO/RH(D): B POS
Antibody Screen: POSITIVE
DAT, IgG: POSITIVE

## 2018-12-14 NOTE — Progress Notes (Signed)
Anesthesia Chart Review:  Case:  [id number] Date/Time:  12/15/18 0730   Procedure:  IRRIGATION AND DEBRIDEMENT left total knee with poly exchange (Left ) - 90min   Anesthesia type:  Spinal   Pre-op diagnosis:  left proximal leg abcess vs infection of total knee replacement   Location:  [facility] ROOM 02 / [facility]   Surgeon:  Durene Boyer, Matthew, MD      DISCUSSION: 73 yo female former smoker. Pertinent hx includes HFpEF, NYHA class II, Hypothyroid, HTN, Migraine, Hiatal hernia, DOE, GERD, paroxysmal Afib, RLS, Moderate pulm HTN.   [facility]. She presented to the ED after a fall at home resulting in left wrist fracture (required ORIF). Shortly after admission she became dyspneic due to acute on chronic HFpEF. Echo 2/22 with mod TR and mod pulm HTN with EF 55-60%. She was treated with medication adjustments and diuresis. During admission her apnea link showed AHI 11.7 and RI 12.2 suspicious for OSA.  She followed up with her cardiologist post discharge on 10/29/2018. Per note in care everywhere she was stable at that time, rate controlled afib. Her amiodarone was stopped recently due to abnormalities noted on chest CT. She was advised to f/u in 6 months.  Anticipate she can proceed as planned barring acute status change.   VS: BP 117/71   Pulse 76   Temp 36.7 C (Oral)   Resp 16   Ht 5\' 2"  (1.575 m)   Wt 76.7 kg   SpO2 96%   BMI 30.91 kg/m   PROVIDERS: [name], Jill Frazier, PA-C is PCP  [name], Asif, MD is Cardiologist   LABS: Labs reviewed: Acceptable for surgery. (all labs ordered are listed, but only abnormal results are displayed)  Labs Reviewed  BASIC METABOLIC PANEL - Abnormal; Notable for the following components:      Result Value   Glucose, Bld 110 (*)    BUN 24 (*)    Creatinine, Ser 1.16 (*)    Calcium 8.6 (*)    GFR calc non Af Amer 47 (*)    GFR calc Af Amer 54 (*)    All other components within normal limits  CBC - Abnormal; Notable  for the following components:   WBC 10.9 (*)    Hemoglobin 11.5 (*)    MCHC 29.9 (*)    RDW 15.9 (*)    All other components within normal limits  PROTIME-INR  TYPE AND SCREEN     IMAGES: CTA Pulmonary 10/12/18 (care everywhere): IMPRESSION:  1. No evidence of pulmonary embolism to the proximal segmental level. 2. Low lung volumes with edema and/or superimposed infection. 3. Similar mediastinal adenopathy. 4. Cardiomegaly.  EKG: 12/14/2018:  Afib. Vent rate 77. LVH. Nonspecific T abn.  CV: TTE 10/10/18 (care everywhere): Normal left ventricular chamber size, wall thickness, and systolic wall motion. Preserved LVEF 55-60%. LV diastolic dysfunction (indeterminate grade) with elevated left ventricular filling pressures. Moderately dilated right ventricle with normal global systolic function. Moderate dilated right atrium. Posterior mitral annular calcification with mild mitral regurgitation. Moderate tricuspid regurgitation. Grossly normal aortic root and proximal ascending aorta. The remainder of the aorta is not visualized. Moderate pulmonary hypertension of 51.7 mmHg. No pericardial effusion.  Past Medical History:  Diagnosis Date  . A-fib (HCC)    PAROXYSMAL  AFIB ON ELIQUIS , [facility]   . Anxiety   . Arthritis   . CHF (congestive heart failure) (HCC)    diastolic Hf  .  Dyspnea    at times  . Dysrhythmia    a fib  . Family history of adverse reaction to anesthesia    Daughter hard to wake up  . GERD (gastroesophageal reflux disease)   . Headache    hx of  . History of hiatal hernia   . History of kidney stones   . Hyperlipidemia   . Hypertension   . Hypothyroidism   . Legally blind    per patient ,patient reports she can only see a blur of images;  needs visual guidance for day to day activities ;   . Palpitations   . Pneumonia   . Pulmonary HTN (HCC)    moderate, see ECHO care everywhere ; patient denies any increases repiratory  effort today   . Restless leg syndrome   . Retinitis pigmentosa     Past Surgical History:  Procedure Laterality Date  . EXCISIONAL TOTAL KNEE ARTHROPLASTY WITH ANTIBIOTIC SPACERS Left 11/11/2016   Procedure: Left total knee arthroplasty resection and placement of antibiotic spacer;  Surgeon: Durene Romans, MD;  Location: [facility];  Service: Orthopedics;  Laterality: Left;  Adductor Block  . EYE SURGERY     cataract extraction  . KNEE ARTHROPLASTY Left 08/1997   done bu [place] in [place], [place]  . knee arthroplasty Left    x4 revision d/t infection and failed initial arthroplasty  . NECK SURGERY  2016  . ORIF WRIST FRACTURE  09/2018   METAL IN PLACE   . Reimplantatio of Left total knee     03/17/17 [name]  . REIMPLANTATION OF TOTAL KNEE Left 03/17/2017   Procedure: REIMPLANTATION OF LEFT TOTAL KNEE Arthroplasty;  Surgeon: Durene Romans, MD;  Location: [facility];  Service: Orthopedics;  Laterality: Left;    MEDICATIONS: . ALPRAZolam (XANAX) 1 MG tablet  . apixaban (ELIQUIS) 5 MG TABS tablet  . atenolol (TENORMIN) 50 MG tablet  . citalopram (CELEXA) 40 MG tablet  . furosemide (LASIX) 40 MG tablet  . gabapentin (NEURONTIN) 600 MG tablet  . levothyroxine (SYNTHROID, LEVOTHROID) 112 MCG tablet  . Melatonin 10 MG TABS  . pantoprazole (PROTONIX) 40 MG tablet  . potassium chloride (K-DUR,KLOR-CON) 10 MEQ tablet  . rOPINIRole (REQUIP) 2 MG tablet   No current facility-administered medications for this encounter.     [name] [facility] Short Stay Center/Anesthesiology Phone [phone] 12/14/2018 1:41 PM

## 2018-12-14 NOTE — Anesthesia Preprocedure Evaluation (Addendum)
Anesthesia Evaluation  Patient identified by MRN, date of birth, ID band Patient awake    Reviewed: Allergy & Precautions, NPO status , Patient's Chart, lab work & pertinent test results  Airway Mallampati: II  TM Distance: >3 FB Neck ROM: Full    Dental  (+) Upper Dentures, Lower Dentures   Pulmonary former smoker,    breath sounds clear to auscultation       Cardiovascular hypertension, Pt. on home beta blockers +CHF  + dysrhythmias Atrial Fibrillation  Rhythm:Irregular Rate:Normal  Pulm HTN   Neuro/Psych  Headaches, Anxiety    GI/Hepatic Neg liver ROS, hiatal hernia, GERD  Medicated,  Endo/Other  Hypothyroidism   Renal/GU negative Renal ROS     Musculoskeletal  (+) Arthritis ,   Abdominal (+) + obese,   Peds  Hematology negative hematology ROS (+)   Anesthesia Other Findings   Reproductive/Obstetrics                          Lab Results  Component Value Date   WBC 10.9 (H) 12/14/2018   HGB 11.5 (L) 12/14/2018   HCT 38.4 12/14/2018   MCV 94.1 12/14/2018   PLT 313 12/14/2018   Lab Results  Component Value Date   INR 0.9 12/14/2018   INR 0.9 09/11/2007    EKG: atrial fibrillation, rate coptrolled.   Anesthesia Physical Anesthesia Plan  ASA: III  Anesthesia Plan: Spinal   Post-op Pain Management:  Regional for Post-op pain   Induction: Intravenous  PONV Risk Score and Plan: 3 and Ondansetron, Dexamethasone and Propofol infusion  Airway Management Planned: Natural Airway and Simple Face Mask  Additional Equipment: None  Intra-op Plan:   Post-operative Plan:   Informed Consent: I have reviewed the patients History and Physical, chart, labs and discussed the procedure including the risks, benefits and alternatives for the proposed anesthesia with the patient or authorized representative who has indicated his/her understanding and acceptance.       Plan Discussed  with: CRNA  Anesthesia Plan Comments: (See PAT note by Antionette Poles, PA-C )      Anesthesia Quick Evaluation

## 2018-12-14 NOTE — H&P (Signed)
[name] is an 73 y.o. female.    Chief Complaint:    Left proximal leg abscess vs infection of TK revision  Procedure:  Left knee I&D with poly exchange  HPI: Pt is a 73 y.o. female with extremely complex history involving her left lower extremity. This history is well-documented through our office and as noted her last surgery with me was in July 2018. She had a complex revision surgery with significant bone loss identified in the proximal tibia as well as distal femur. She has not been seen since October 2018 and presents today with a recent onset of of some discomfort on the proximal medial tibia associated with swelling. She otherwise has chronic burning pain. She uses gabapentin to help with that burning pain as well as meloxicam.  Various options are discussed with the patient. Risks, benefits and expectations were discussed with the patient. Patient understand the risks, benefits and expectations and wishes to proceed with surgery.    PCP: Clemencia Course, PA-C  D/C Plans:       Home  Post-op Meds:       No Rx given   Tranexamic Acid:      To be given - IV   Decadron:      Is to be given  FYI:     Eliquis  Norco   PMH: Past Medical History:  Diagnosis Date  . A-fib (HCC)    PAROXYSMAL  AFIB ON ELIQUIS , MGD BY CARDIO DR [facility]   . Anxiety   . Arthritis   . CHF (congestive heart failure) (HCC)    diastolic Hf  . Dyspnea    at times  . Dysrhythmia    a fib  . Family history of adverse reaction to anesthesia    Daughter hard to wake up  . GERD (gastroesophageal reflux disease)   . Headache    hx of  . History of hiatal hernia   . History of kidney stones   . Hyperlipidemia   . Hypertension   . Hypothyroidism   . Legally blind    per patient ,patient reports she can only see a blur of images;  needs visual guidance for day to day activities ;   . Palpitations   . Pneumonia   . Pulmonary HTN (HCC)    moderate, see ECHO care  everywhere ; patient denies any increases repiratory effort today   . Restless leg syndrome   . Retinitis pigmentosa     PSH: Past Surgical History:  Procedure Laterality Date  . EXCISIONAL TOTAL KNEE ARTHROPLASTY WITH ANTIBIOTIC SPACERS Left 11/11/2016   Procedure: Left total knee arthroplasty resection and placement of antibiotic spacer;  Surgeon: Durene Romans, MD;  Location: [facility];  Service: Orthopedics;  Laterality: Left;  Adductor Block  . EYE SURGERY     cataract extraction  . KNEE ARTHROPLASTY Left 08/1997   done bu [place] in [place]  . knee arthroplasty Left    x4 revision d/t infection and failed initial arthroplasty  . NECK SURGERY  2016  . ORIF WRIST FRACTURE  09/2018   METAL IN PLACE   . Reimplantatio of Left total knee     03/17/17 Dr. Charlann Boxer  . REIMPLANTATION OF TOTAL KNEE Left 03/17/2017   Procedure: REIMPLANTATION OF LEFT TOTAL KNEE Arthroplasty;  Surgeon: Durene Romans, MD;  Location: [facility];  Service: Orthopedics;  Laterality: Left;    Social History:  reports that she has quit smoking.  Her smoking use included cigarettes. She quit after 28.00 years of use. She has never used smokeless tobacco. She reports that she does not drink alcohol or use drugs.  Allergies:  Allergies  Allergen Reactions  . Amiodarone Other (See Comments)    Restless legs  . Benadryl [Diphenhydramine Hcl] Hives  . Metoprolol Other (See Comments)    Restless legs  . Septra [Sulfamethoxazole-Trimethoprim] Hives  . Other Rash    Metal    Medications: No current facility-administered medications for this encounter.    Current Outpatient Medications  Medication Sig Dispense Refill  . ALPRAZolam (XANAX) 1 MG tablet Take 1 mg by mouth at bedtime.    . apixaban (ELIQUIS) 5 MG TABS tablet Take 5 mg by mouth 2 (two) times daily.    Marland Kitchen. atenolol (TENORMIN) 50 MG tablet Take 50 mg by mouth daily.    . citalopram (CELEXA) 40 MG tablet Take 40 mg by moutMarland Kitchenh every evening.    . furosemide (LASIX)  40 MG tablet Take 40 mg by mouth daily.     Marland Kitchen. gabapentin (NEURONTIN) 600 MG tablet Take 1,200 mg by mouth at bedtime.     Marland Kitchen. levothyroxine (SYNTHROID, LEVOTHROID) 112 MCG tablet Take 112 mcg by mouth every evening.     . Melatonin 10 MG TABS Take 20 mg by mouth at bedtime.    . pantoprazole (PROTONIX) 40 MG tablet Take 40 mg by mouth daily.    . potassium chloride (K-DUR,KLOR-CON) 10 MEQ tablet Take 10 mEq by mouth daily as needed (poatassium).     Marland Kitchen. rOPINIRole (REQUIP) 2 MG tablet Take 2 mg by mouth 4 (four) times daily.       Results for orders placed or performed during the hospital encounter of 12/14/18 (from the past 48 hour(s))  Basic metabolic panel     Status: Abnormal   Collection Time: 12/14/18 12:00 PM  Result Value Ref Range   Sodium 138 135 - 145 mmol/L   Potassium 4.2 3.5 - 5.1 mmol/L   Chloride 102 98 - 111 mmol/L   CO2 27 22 - 32 mmol/L   Glucose, Bld 110 (H) 70 - 99 mg/dL   BUN 24 (H) 8 - 23 mg/dL   Creatinine, Ser 1.611.16 (H) 0.44 - 1.00 mg/dL   Calcium 8.6 (L) 8.9 - 10.3 mg/dL   GFR calc non Af Amer 47 (L) >60 mL/min   GFR calc Af Amer 54 (L) >60 mL/min   Anion gap 9 5 - 15    Comment: Performed at [facility], [address] [place], [place] [address]  CBC     Status: Abnormal   Collection Time: 12/14/18 12:00 PM  Result Value Ref Range   WBC 10.9 (H) 4.0 - 10.5 K/uL   RBC 4.08 3.87 - 5.11 MIL/uL   Hemoglobin 11.5 (L) 12.0 - 15.0 g/dL   HCT 54.038.4 98.136.0 - 19.146.0 %   MCV 94.1 80.0 - 100.0 fL   MCH 28.2 26.0 - 34.0 pg   MCHC 29.9 (L) 30.0 - 36.0 g/dL   RDW 47.815.9 (H) 29.511.5 - 62.115.5 %   Platelets 313 150 - 400 K/uL   nRBC 0.0 0.0 - 0.2 %    Comment: Performed at [facility], [address] [place], [place] [address]  Protime-INR     Status: None   Collection Time: 12/14/18 12:00 PM  Result Value Ref Range   Prothrombin Time 12.4 11.4 - 15.2 seconds   INR 0.9 0.8 -  1.2    Comment: (NOTE) INR goal varies based on device and  disease states. Performed at [facility], [address] [place], [place] [address]       Review of Systems  Constitutional: Negative.   HENT: Negative.   Eyes: Negative.        Blindness  Respiratory: Negative.   Cardiovascular: Positive for palpitations and leg swelling.  Gastrointestinal: Negative.   Genitourinary: Negative.   Musculoskeletal: Positive for joint pain.  Skin: Negative.   Neurological: Negative.   Endo/Heme/Allergies: Negative.   Psychiatric/Behavioral: Positive for depression. The patient is nervous/anxious.        Physical Exam  Constitutional: She is oriented to person, place, and time. She appears well-developed.  HENT:  Head: Normocephalic.  Eyes: Pupils are equal, round, and reactive to light.  Neck: Neck supple. No JVD present. No tracheal deviation present. No thyromegaly present.  Cardiovascular: Normal rate, regular rhythm and intact distal pulses.  Respiratory: Effort normal and breath sounds normal. No respiratory distress. She has no wheezes.  GI: Soft. There is no abdominal tenderness. There is no guarding.  Musculoskeletal:     Left knee: She exhibits decreased range of motion and swelling. Tenderness found.  Lymphadenopathy:    She has no cervical adenopathy.  Neurological: She is alert and oriented to person, place, and time.  Skin: Skin is warm and dry.  Psychiatric: She has a normal mood and affect.       Assessment/Plan Assessment:   Left proximal leg abscess vs infection of TK revision  Plan: Patient will undergo a left knee I&D with poly exchange on 12/15/2018 per Dr. Charlann Boxer at [facility]. Risks benefits and expectations were discussed with the patient. Patient understand risks, benefits and expectations and wishes to proceed. It is imperative that Dr. Charlann Boxer take Ms. Wadas to the operating room to identify the source of swelling. He did not feel fluid enough that he could aspirate, but nonetheless  an operation would need to occur no matter what. If this is infection it needs to be treated aggressively with significant debridement, polyethylene exchange and subsequent treatment with IV antibiotics and long-term oral antibiotics. The reason this is important is based on her last revision and available bone stock for the removal of implants may result in enough morbidity associated with this that could lead to no further knee reimplantation.      Anastasio Auerbach Inika Bellanger   PA-C  12/14/2018, 1:06 PM

## 2018-12-14 NOTE — Progress Notes (Signed)
EKG , ECHO, CXR current in  Epic , denies cardiac sx today    reports last dose of Eliquis was 1 week ago -she dc/d according to surgeons instructions  Patient states she is legally blind, lives with husband at home and is assisted by her 2 daughters daily , patient needs x1 assist to ambulate, toilet, feeding, etc . Uses cane at home. Able to sign her own legal documents if read to her. States her daugther Misty Stanley (here at pre-op today) is her HCPOA but she reports she has not prepared this in a legal document. consents were read in full to patient, and were signed with aide of this RN.

## 2018-12-15 ENCOUNTER — Inpatient Hospital Stay (HOSPITAL_COMMUNITY): Payer: Medicare Other | Admitting: Physician Assistant

## 2018-12-15 ENCOUNTER — Inpatient Hospital Stay (HOSPITAL_COMMUNITY)
Admission: RE | Admit: 2018-12-15 | Discharge: 2018-12-18 | DRG: 485 | Disposition: A | Discharge status: Home Health | Payer: Medicare Other | Attending: Orthopedic Surgery | Admitting: Orthopedic Surgery

## 2018-12-15 ENCOUNTER — Other Ambulatory Visit: Payer: Self-pay

## 2018-12-15 ENCOUNTER — Inpatient Hospital Stay: Payer: Self-pay

## 2018-12-15 ENCOUNTER — Encounter (HOSPITAL_COMMUNITY)
Admission: RE | Disposition: A | Discharge status: Home Health | Payer: Self-pay | Source: Home / Self Care | Attending: Orthopedic Surgery

## 2018-12-15 ENCOUNTER — Encounter (HOSPITAL_COMMUNITY): Payer: Self-pay | Admitting: Emergency Medicine

## 2018-12-15 ENCOUNTER — Inpatient Hospital Stay (HOSPITAL_COMMUNITY): Payer: Medicare Other

## 2018-12-15 DIAGNOSIS — Z96652 Presence of left artificial knee joint: Secondary | ICD-10-CM

## 2018-12-15 DIAGNOSIS — I480 Paroxysmal atrial fibrillation: Secondary | ICD-10-CM | POA: Diagnosis present

## 2018-12-15 DIAGNOSIS — T8454XA Infection and inflammatory reaction due to internal left knee prosthesis, initial encounter: Principal | ICD-10-CM

## 2018-12-15 DIAGNOSIS — Y831 Surgical operation with implant of artificial internal device as the cause of abnormal reaction of the patient, or of later complication, without mention of misadventure at the time of the procedure: Secondary | ICD-10-CM | POA: Diagnosis present

## 2018-12-15 DIAGNOSIS — K219 Gastro-esophageal reflux disease without esophagitis: Secondary | ICD-10-CM | POA: Diagnosis present

## 2018-12-15 DIAGNOSIS — I5033 Acute on chronic diastolic (congestive) heart failure: Secondary | ICD-10-CM | POA: Diagnosis not present

## 2018-12-15 DIAGNOSIS — Z79890 Hormone replacement therapy (postmenopausal): Secondary | ICD-10-CM | POA: Diagnosis not present

## 2018-12-15 DIAGNOSIS — E785 Hyperlipidemia, unspecified: Secondary | ICD-10-CM | POA: Diagnosis present

## 2018-12-15 DIAGNOSIS — Z7901 Long term (current) use of anticoagulants: Secondary | ICD-10-CM

## 2018-12-15 DIAGNOSIS — E669 Obesity, unspecified: Secondary | ICD-10-CM | POA: Diagnosis present

## 2018-12-15 DIAGNOSIS — E039 Hypothyroidism, unspecified: Secondary | ICD-10-CM | POA: Diagnosis not present

## 2018-12-15 DIAGNOSIS — H3552 Pigmentary retinal dystrophy: Secondary | ICD-10-CM | POA: Diagnosis present

## 2018-12-15 DIAGNOSIS — I110 Hypertensive heart disease with heart failure: Secondary | ICD-10-CM | POA: Diagnosis present

## 2018-12-15 DIAGNOSIS — H548 Legal blindness, as defined in USA: Secondary | ICD-10-CM | POA: Diagnosis not present

## 2018-12-15 DIAGNOSIS — Z95828 Presence of other vascular implants and grafts: Secondary | ICD-10-CM

## 2018-12-15 DIAGNOSIS — I272 Pulmonary hypertension, unspecified: Secondary | ICD-10-CM | POA: Diagnosis present

## 2018-12-15 DIAGNOSIS — Z79899 Other long term (current) drug therapy: Secondary | ICD-10-CM | POA: Diagnosis not present

## 2018-12-15 DIAGNOSIS — Z87891 Personal history of nicotine dependence: Secondary | ICD-10-CM

## 2018-12-15 DIAGNOSIS — Z6830 Body mass index (BMI) 30.0-30.9, adult: Secondary | ICD-10-CM

## 2018-12-15 DIAGNOSIS — M25562 Pain in left knee: Secondary | ICD-10-CM

## 2018-12-15 DIAGNOSIS — Z888 Allergy status to other drugs, medicaments and biological substances status: Secondary | ICD-10-CM | POA: Diagnosis not present

## 2018-12-15 DIAGNOSIS — F419 Anxiety disorder, unspecified: Secondary | ICD-10-CM | POA: Diagnosis not present

## 2018-12-15 DIAGNOSIS — G2581 Restless legs syndrome: Secondary | ICD-10-CM | POA: Diagnosis not present

## 2018-12-15 DIAGNOSIS — R0602 Shortness of breath: Secondary | ICD-10-CM

## 2018-12-15 DIAGNOSIS — T8450XA Infection and inflammatory reaction due to unspecified internal joint prosthesis, initial encounter: Secondary | ICD-10-CM | POA: Diagnosis not present

## 2018-12-15 HISTORY — PX: I&D KNEE WITH POLY EXCHANGE: SHX5024

## 2018-12-15 LAB — C-REACTIVE PROTEIN: CRP: 1.5 mg/dL — ABNORMAL HIGH (ref <1.0)

## 2018-12-15 LAB — SEDIMENTATION RATE: Sed Rate: 68 mm/hr — ABNORMAL HIGH (ref 0–22)

## 2018-12-15 SURGERY — IRRIGATION AND DEBRIDEMENT KNEE WITH POLY EXCHANGE
Anesthesia: Spinal | Site: Knee | Laterality: Left

## 2018-12-15 MED ORDER — DEXAMETHASONE SODIUM PHOSPHATE 10 MG/ML IJ SOLN
10.0000 mg | Freq: Once | INTRAMUSCULAR | Status: AC
  Administered 2018-12-16: 10 mg via INTRAVENOUS
  Filled 2018-12-15: qty 1

## 2018-12-15 MED ORDER — ROPINIROLE HCL 1 MG PO TABS
2.0000 mg | ORAL_TABLET | Freq: Four times a day (QID) | ORAL | Status: DC
  Administered 2018-12-17: 09:00:00 2 mg via ORAL
  Filled 2018-12-15 (×7): qty 2

## 2018-12-15 MED ORDER — HYDROMORPHONE HCL 1 MG/ML IJ SOLN: 0.5000 mg | INTRAMUSCULAR | Status: DC | PRN

## 2018-12-15 MED ORDER — FENTANYL CITRATE (PF) 100 MCG/2ML IJ SOLN
INTRAMUSCULAR | Status: DC | PRN
  Administered 2018-12-15: 50 ug via INTRAVENOUS

## 2018-12-15 MED ORDER — CITALOPRAM HYDROBROMIDE 20 MG PO TABS
40.0000 mg | ORAL_TABLET | Freq: Every evening | ORAL | Status: DC
  Filled 2018-12-15 (×3): qty 2

## 2018-12-15 MED ORDER — FUROSEMIDE 40 MG PO TABS
40.0000 mg | ORAL_TABLET | Freq: Every day | ORAL | Status: DC
  Administered 2018-12-16 – 2018-12-18 (×3): 40 mg via ORAL
  Filled 2018-12-15 (×4): qty 1

## 2018-12-15 MED ORDER — SUCCINYLCHOLINE CHLORIDE 200 MG/10ML IV SOSY
PREFILLED_SYRINGE | INTRAVENOUS | Status: AC
  Filled 2018-12-15: qty 10

## 2018-12-15 MED ORDER — VANCOMYCIN HCL 1000 MG IV SOLR
INTRAVENOUS | Status: DC | PRN
  Administered 2018-12-15: 1000 mg via INTRAVENOUS

## 2018-12-15 MED ORDER — MIDAZOLAM HCL 5 MG/5ML IJ SOLN
INTRAMUSCULAR | Status: DC | PRN
  Administered 2018-12-15 (×2): 1 mg via INTRAVENOUS

## 2018-12-15 MED ORDER — SODIUM CHLORIDE (PF) 0.9 % IJ SOLN
INTRAMUSCULAR | Status: AC
  Filled 2018-12-15: qty 50

## 2018-12-15 MED ORDER — ACETAMINOPHEN 325 MG PO TABS: 325.0000 mg | ORAL_TABLET | Freq: Once | ORAL | Status: DC

## 2018-12-15 MED ORDER — ONDANSETRON HCL 4 MG PO TABS: 4.0000 mg | ORAL_TABLET | Freq: Four times a day (QID) | ORAL | Status: DC | PRN

## 2018-12-15 MED ORDER — SODIUM CHLORIDE (PF) 0.9 % IJ SOLN
INTRAMUSCULAR | Status: DC | PRN
  Administered 2018-12-15: 30 mL

## 2018-12-15 MED ORDER — CEFAZOLIN SODIUM-DEXTROSE 2-4 GM/100ML-% IV SOLN
2.0000 g | Freq: Four times a day (QID) | INTRAVENOUS | Status: AC
  Administered 2018-12-15 (×2): 2 g via INTRAVENOUS
  Filled 2018-12-15 (×3): qty 100

## 2018-12-15 MED ORDER — PANTOPRAZOLE SODIUM 40 MG PO TBEC
40.0000 mg | DELAYED_RELEASE_TABLET | Freq: Every day | ORAL | Status: DC
  Administered 2018-12-16 – 2018-12-18 (×3): 40 mg via ORAL
  Filled 2018-12-15 (×3): qty 1

## 2018-12-15 MED ORDER — FERROUS SULFATE 325 (65 FE) MG PO TABS
325.0000 mg | ORAL_TABLET | Freq: Two times a day (BID) | ORAL | Status: DC
  Administered 2018-12-15 – 2018-12-18 (×6): 325 mg via ORAL
  Filled 2018-12-15 (×6): qty 1

## 2018-12-15 MED ORDER — ROPIVACAINE HCL 7.5 MG/ML IJ SOLN
INTRAMUSCULAR | Status: DC | PRN
  Administered 2018-12-15: 20 mL via PERINEURAL

## 2018-12-15 MED ORDER — ROCURONIUM BROMIDE 10 MG/ML (PF) SYRINGE
PREFILLED_SYRINGE | INTRAVENOUS | Status: AC
  Filled 2018-12-15: qty 10

## 2018-12-15 MED ORDER — SODIUM CHLORIDE 0.9% FLUSH
10.0000 mL | INTRAVENOUS | Status: DC | PRN
  Administered 2018-12-18: 14:00:00 10 mL
  Filled 2018-12-15: qty 40

## 2018-12-15 MED ORDER — METHOCARBAMOL 500 MG PO TABS
500.0000 mg | ORAL_TABLET | Freq: Four times a day (QID) | ORAL | Status: DC | PRN
  Administered 2018-12-17 – 2018-12-18 (×2): 500 mg via ORAL
  Filled 2018-12-15 (×2): qty 1

## 2018-12-15 MED ORDER — ATENOLOL 50 MG PO TABS
50.0000 mg | ORAL_TABLET | Freq: Every day | ORAL | Status: DC
  Administered 2018-12-16 – 2018-12-17 (×2): 50 mg via ORAL
  Filled 2018-12-15 (×3): qty 1

## 2018-12-15 MED ORDER — CHLORHEXIDINE GLUCONATE 4 % EX LIQD: 60.0000 mL | Freq: Once | CUTANEOUS | Status: DC

## 2018-12-15 MED ORDER — TRANEXAMIC ACID-NACL 1000-0.7 MG/100ML-% IV SOLN
1000.0000 mg | Freq: Once | INTRAVENOUS | Status: AC
  Administered 2018-12-15: 1000 mg via INTRAVENOUS
  Filled 2018-12-15: qty 100

## 2018-12-15 MED ORDER — POVIDONE-IODINE 10 % EX SWAB
2.0000 "application " | Freq: Once | CUTANEOUS | Status: AC
  Administered 2018-12-15: 2 via TOPICAL

## 2018-12-15 MED ORDER — LACTATED RINGERS IV SOLN
INTRAVENOUS | Status: DC
  Administered 2018-12-15: 06:00:00 via INTRAVENOUS

## 2018-12-15 MED ORDER — RIFAMPIN 300 MG PO CAPS: 300.0000 mg | ORAL_CAPSULE | Freq: Two times a day (BID) | ORAL | Status: DC

## 2018-12-15 MED ORDER — KETOROLAC TROMETHAMINE 30 MG/ML IJ SOLN
INTRAMUSCULAR | Status: DC | PRN
  Administered 2018-12-15: 30 mg via INTRAMUSCULAR

## 2018-12-15 MED ORDER — LEVOTHYROXINE SODIUM 112 MCG PO TABS
112.0000 ug | ORAL_TABLET | Freq: Every evening | ORAL | Status: DC
  Administered 2018-12-15 – 2018-12-17 (×3): 112 ug via ORAL
  Filled 2018-12-15 (×3): qty 1

## 2018-12-15 MED ORDER — MAGNESIUM CITRATE PO SOLN: 1.0000 | Freq: Once | ORAL | Status: DC | PRN

## 2018-12-15 MED ORDER — BUPIVACAINE-EPINEPHRINE (PF) 0.25% -1:200000 IJ SOLN
INTRAMUSCULAR | Status: AC
  Filled 2018-12-15: qty 60

## 2018-12-15 MED ORDER — BISACODYL 10 MG RE SUPP: 10.0000 mg | Freq: Every day | RECTAL | Status: DC | PRN

## 2018-12-15 MED ORDER — MEPERIDINE HCL 50 MG/ML IJ SOLN: 6.2500 mg | INTRAMUSCULAR | Status: DC | PRN

## 2018-12-15 MED ORDER — ACETAMINOPHEN 325 MG PO TABS: 325.0000 mg | ORAL_TABLET | Freq: Four times a day (QID) | ORAL | Status: DC | PRN

## 2018-12-15 MED ORDER — DOCUSATE SODIUM 100 MG PO CAPS
100.0000 mg | ORAL_CAPSULE | Freq: Two times a day (BID) | ORAL | Status: DC
  Administered 2018-12-17: 100 mg via ORAL
  Filled 2018-12-15 (×5): qty 1

## 2018-12-15 MED ORDER — ACETAMINOPHEN 160 MG/5ML PO SOLN: 325.0000 mg | Freq: Once | ORAL | Status: DC

## 2018-12-15 MED ORDER — POTASSIUM CHLORIDE CRYS ER 10 MEQ PO TBCR: 10.0000 meq | EXTENDED_RELEASE_TABLET | Freq: Every day | ORAL | Status: DC | PRN

## 2018-12-15 MED ORDER — PROPOFOL 10 MG/ML IV BOLUS
INTRAVENOUS | Status: AC
  Filled 2018-12-15: qty 20

## 2018-12-15 MED ORDER — VANCOMYCIN HCL IN DEXTROSE 1-5 GM/200ML-% IV SOLN
INTRAVENOUS | Status: AC
  Filled 2018-12-15: qty 200

## 2018-12-15 MED ORDER — EPHEDRINE SULFATE-NACL 50-0.9 MG/10ML-% IV SOSY
PREFILLED_SYRINGE | INTRAVENOUS | Status: DC | PRN
  Administered 2018-12-15 (×3): 5 mg via INTRAVENOUS

## 2018-12-15 MED ORDER — HYDROXYZINE HCL 10 MG PO TABS
10.0000 mg | ORAL_TABLET | Freq: Three times a day (TID) | ORAL | Status: DC | PRN
  Filled 2018-12-15: qty 1

## 2018-12-15 MED ORDER — PHENYLEPHRINE 40 MCG/ML (10ML) SYRINGE FOR IV PUSH (FOR BLOOD PRESSURE SUPPORT)
PREFILLED_SYRINGE | INTRAVENOUS | Status: DC | PRN
  Administered 2018-12-15 (×2): 40 ug via INTRAVENOUS
  Administered 2018-12-15 (×4): 80 ug via INTRAVENOUS

## 2018-12-15 MED ORDER — ONDANSETRON HCL 4 MG/2ML IJ SOLN
INTRAMUSCULAR | Status: DC | PRN
  Administered 2018-12-15: 4 mg via INTRAVENOUS

## 2018-12-15 MED ORDER — METOCLOPRAMIDE HCL 5 MG PO TABS: 5.0000 mg | ORAL_TABLET | Freq: Three times a day (TID) | ORAL | Status: DC | PRN

## 2018-12-15 MED ORDER — CEFAZOLIN SODIUM-DEXTROSE 2-4 GM/100ML-% IV SOLN
INTRAVENOUS | Status: AC
  Filled 2018-12-15: qty 100

## 2018-12-15 MED ORDER — LIDOCAINE 2% (20 MG/ML) 5 ML SYRINGE
INTRAMUSCULAR | Status: AC
  Filled 2018-12-15: qty 5

## 2018-12-15 MED ORDER — SODIUM CHLORIDE 0.9 % IV SOLN
INTRAVENOUS | Status: DC
  Administered 2018-12-15 – 2018-12-17 (×3): via INTRAVENOUS

## 2018-12-15 MED ORDER — MIDAZOLAM HCL 2 MG/2ML IJ SOLN
INTRAMUSCULAR | Status: AC
  Filled 2018-12-15: qty 2

## 2018-12-15 MED ORDER — ALUM & MAG HYDROXIDE-SIMETH 200-200-20 MG/5ML PO SUSP: 15.0000 mL | ORAL | Status: DC | PRN

## 2018-12-15 MED ORDER — MENTHOL 3 MG MT LOZG: 1.0000 | LOZENGE | OROMUCOSAL | Status: DC | PRN

## 2018-12-15 MED ORDER — POLYETHYLENE GLYCOL 3350 17 G PO PACK
17.0000 g | PACK | Freq: Two times a day (BID) | ORAL | Status: DC
  Filled 2018-12-15 (×5): qty 1

## 2018-12-15 MED ORDER — SODIUM CHLORIDE 0.9 % IR SOLN
Status: DC | PRN
  Administered 2018-12-15: 6000 mL

## 2018-12-15 MED ORDER — FENTANYL CITRATE (PF) 250 MCG/5ML IJ SOLN
INTRAMUSCULAR | Status: AC
  Filled 2018-12-15: qty 5

## 2018-12-15 MED ORDER — ACETAMINOPHEN 10 MG/ML IV SOLN: 1000.0000 mg | Freq: Once | INTRAVENOUS | Status: DC | PRN

## 2018-12-15 MED ORDER — METOCLOPRAMIDE HCL 5 MG/ML IJ SOLN: 5.0000 mg | Freq: Three times a day (TID) | INTRAMUSCULAR | Status: DC | PRN

## 2018-12-15 MED ORDER — ALPRAZOLAM 1 MG PO TABS
1.0000 mg | ORAL_TABLET | Freq: Every day | ORAL | Status: DC
  Administered 2018-12-15 – 2018-12-17 (×3): 1 mg via ORAL
  Filled 2018-12-15 (×3): qty 1

## 2018-12-15 MED ORDER — DEXAMETHASONE SODIUM PHOSPHATE 10 MG/ML IJ SOLN
10.0000 mg | Freq: Once | INTRAMUSCULAR | Status: AC
  Administered 2018-12-15: 08:00:00 10 mg via INTRAVENOUS

## 2018-12-15 MED ORDER — BUPIVACAINE-EPINEPHRINE (PF) 0.25% -1:200000 IJ SOLN
INTRAMUSCULAR | Status: DC | PRN
  Administered 2018-12-15: 30 mL

## 2018-12-15 MED ORDER — HYDROCODONE-ACETAMINOPHEN 7.5-325 MG PO TABS
1.0000 | ORAL_TABLET | ORAL | Status: DC | PRN
  Administered 2018-12-16 – 2018-12-17 (×6): 2 via ORAL
  Filled 2018-12-15 (×6): qty 2

## 2018-12-15 MED ORDER — KETOROLAC TROMETHAMINE 30 MG/ML IJ SOLN
INTRAMUSCULAR | Status: AC
  Filled 2018-12-15: qty 1

## 2018-12-15 MED ORDER — BUPIVACAINE IN DEXTROSE 0.75-8.25 % IT SOLN
INTRATHECAL | Status: DC | PRN
  Administered 2018-12-15: 1.6 mL via INTRATHECAL

## 2018-12-15 MED ORDER — ONDANSETRON HCL 4 MG/2ML IJ SOLN: 4.0000 mg | Freq: Four times a day (QID) | INTRAMUSCULAR | Status: DC | PRN

## 2018-12-15 MED ORDER — HYDROMORPHONE HCL 1 MG/ML IJ SOLN: 0.2500 mg | INTRAMUSCULAR | Status: DC | PRN

## 2018-12-15 MED ORDER — CEFAZOLIN SODIUM-DEXTROSE 2-3 GM-%(50ML) IV SOLR
INTRAVENOUS | Status: DC | PRN
  Administered 2018-12-15: 2 g via INTRAVENOUS

## 2018-12-15 MED ORDER — 0.9 % SODIUM CHLORIDE (POUR BTL) OPTIME
TOPICAL | Status: DC | PRN
  Administered 2018-12-15: 08:00:00 1000 mL

## 2018-12-15 MED ORDER — GABAPENTIN 400 MG PO CAPS
1200.0000 mg | ORAL_CAPSULE | Freq: Every day | ORAL | Status: DC
  Administered 2018-12-15 – 2018-12-17 (×3): 1200 mg via ORAL
  Filled 2018-12-15 (×3): qty 3

## 2018-12-15 MED ORDER — SODIUM CHLORIDE 0.9 % IV SOLN
INTRAVENOUS | Status: DC | PRN
  Administered 2018-12-15: 40 ug/min via INTRAVENOUS

## 2018-12-15 MED ORDER — STERILE WATER FOR IRRIGATION IR SOLN
Status: DC | PRN
  Administered 2018-12-15: 2000 mL

## 2018-12-15 MED ORDER — PHENOL 1.4 % MT LIQD: 1.0000 | OROMUCOSAL | Status: DC | PRN

## 2018-12-15 MED ORDER — HYDROCODONE-ACETAMINOPHEN 5-325 MG PO TABS
1.0000 | ORAL_TABLET | ORAL | Status: DC | PRN
  Administered 2018-12-15: 2 via ORAL
  Administered 2018-12-15: 13:00:00 1 via ORAL
  Administered 2018-12-15 – 2018-12-18 (×6): 2 via ORAL
  Administered 2018-12-18 (×2): 1 via ORAL
  Filled 2018-12-15 (×2): qty 2
  Filled 2018-12-15 (×2): qty 1
  Filled 2018-12-15 (×3): qty 2
  Filled 2018-12-15: qty 1
  Filled 2018-12-15 (×3): qty 2

## 2018-12-15 MED ORDER — LACTATED RINGERS IV SOLN: INTRAVENOUS | Status: DC

## 2018-12-15 MED ORDER — PROPOFOL 500 MG/50ML IV EMUL
INTRAVENOUS | Status: DC | PRN
  Administered 2018-12-15: 75 ug/kg/min via INTRAVENOUS

## 2018-12-15 MED ORDER — METHOCARBAMOL 1000 MG/10ML IJ SOLN
500.0000 mg | Freq: Four times a day (QID) | INTRAVENOUS | Status: DC | PRN
  Filled 2018-12-15: qty 5

## 2018-12-15 MED ORDER — APIXABAN 2.5 MG PO TABS
2.5000 mg | ORAL_TABLET | Freq: Two times a day (BID) | ORAL | Status: DC
  Administered 2018-12-16 – 2018-12-18 (×5): 2.5 mg via ORAL
  Filled 2018-12-15 (×5): qty 1

## 2018-12-15 MED ORDER — VANCOMYCIN HCL 500 MG IV SOLR
500.0000 mg | INTRAVENOUS | Status: AC
  Administered 2018-12-15: 500 mg via INTRAVENOUS
  Filled 2018-12-15: qty 500

## 2018-12-15 MED ORDER — MELATONIN 5 MG PO TABS
20.0000 mg | ORAL_TABLET | Freq: Every day | ORAL | Status: DC
  Administered 2018-12-15 – 2018-12-17 (×3): 20 mg via ORAL
  Filled 2018-12-15 (×3): qty 4

## 2018-12-15 MED ORDER — VANCOMYCIN HCL 10 G IV SOLR: 1250.0000 mg | INTRAVENOUS | Status: DC

## 2018-12-15 MED ORDER — TRANEXAMIC ACID-NACL 1000-0.7 MG/100ML-% IV SOLN
1000.0000 mg | INTRAVENOUS | Status: AC
  Administered 2018-12-15: 08:00:00 1000 mg via INTRAVENOUS
  Filled 2018-12-15: qty 100

## 2018-12-15 SURGICAL SUPPLY — 48 items
ADH SKN CLS APL DERMABOND .7 (GAUZE/BANDAGES/DRESSINGS) ×2
ARTICULAR GENESIS CONST IN (Insert) ×2 IMPLANT
ARTISURF GENESIS (Insert) ×1 IMPLANT
BAG SPEC THK2 15X12 ZIP CLS (MISCELLANEOUS) ×1
BAG ZIPLOCK 12X15 (MISCELLANEOUS) ×2 IMPLANT
BANDAGE ACE 4X5 VEL STRL LF (GAUZE/BANDAGES/DRESSINGS) IMPLANT
BANDAGE ACE 6X5 VEL STRL LF (GAUZE/BANDAGES/DRESSINGS) ×4 IMPLANT
BLADE SAW SAG 90X13X1.27 (BLADE) ×2 IMPLANT
CHLORAPREP W/TINT 26 (MISCELLANEOUS) ×4 IMPLANT
COVER SURGICAL LIGHT HANDLE (MISCELLANEOUS) ×2 IMPLANT
COVER WAND RF STERILE (DRAPES) IMPLANT
CUFF TOURN SGL QUICK 34 (TOURNIQUET CUFF) ×2
CUFF TRNQT CYL 34X4.125X (TOURNIQUET CUFF) ×1 IMPLANT
DECANTER SPIKE VIAL GLASS SM (MISCELLANEOUS) ×2 IMPLANT
DERMABOND ADVANCED (GAUZE/BANDAGES/DRESSINGS) ×2
DERMABOND ADVANCED .7 DNX12 (GAUZE/BANDAGES/DRESSINGS) ×2 IMPLANT
DRAPE U-SHAPE 47X51 STRL (DRAPES) ×2 IMPLANT
DRSG AQUACEL AG ADV 3.5X10 (GAUZE/BANDAGES/DRESSINGS) IMPLANT
DRSG AQUACEL AG ADV 3.5X14 (GAUZE/BANDAGES/DRESSINGS) ×2 IMPLANT
ELECT REM PT RETURN 15FT ADLT (MISCELLANEOUS) ×2 IMPLANT
GLOVE BIOGEL PI IND STRL 7.5 (GLOVE) ×1 IMPLANT
GLOVE BIOGEL PI IND STRL 8.5 (GLOVE) ×1 IMPLANT
GLOVE BIOGEL PI INDICATOR 7.5 (GLOVE) ×1
GLOVE BIOGEL PI INDICATOR 8.5 (GLOVE) ×1
GLOVE ECLIPSE 8.0 STRL XLNG CF (GLOVE) ×2 IMPLANT
GLOVE ORTHO TXT STRL SZ7.5 (GLOVE) ×2 IMPLANT
GOWN STRL REUS W/TWL LRG LVL3 (GOWN DISPOSABLE) ×2 IMPLANT
GOWN STRL REUS W/TWL XL LVL3 (GOWN DISPOSABLE) ×2 IMPLANT
HANDPIECE INTERPULSE COAX TIP (DISPOSABLE) ×1
JET LAVAGE IRRISEPT WOUND (IRRIGATION / IRRIGATOR) ×2
KIT TURNOVER KIT A (KITS) ×2 IMPLANT
LAVAGE JET IRRISEPT WOUND (IRRIGATION / IRRIGATOR) ×1 IMPLANT
MANIFOLD NEPTUNE II (INSTRUMENTS) ×2 IMPLANT
PACK TOTAL KNEE CUSTOM (KITS) ×2 IMPLANT
PROTECTOR NERVE ULNAR (MISCELLANEOUS) ×2 IMPLANT
SET HNDPC FAN SPRY TIP SCT (DISPOSABLE) ×1 IMPLANT
SET PAD KNEE POSITIONER (MISCELLANEOUS) ×2 IMPLANT
STAPLER VISISTAT 35W (STAPLE) IMPLANT
SUT MNCRL AB 3-0 PS2 18 (SUTURE) ×2 IMPLANT
SUT STRATAFIX 0 PDS 27 VIOLET (SUTURE) ×2
SUT VIC AB 1 CT1 36 (SUTURE) ×2 IMPLANT
SUT VIC AB 2-0 CT1 27 (SUTURE) ×3
SUT VIC AB 2-0 CT1 TAPERPNT 27 (SUTURE) ×3 IMPLANT
SUTURE STRATFX 0 PDS 27 VIOLET (SUTURE) ×1 IMPLANT
SWAB COLLECTION DEVICE MRSA (MISCELLANEOUS) ×4 IMPLANT
SWAB CULTURE ESWAB REG 1ML (MISCELLANEOUS) ×4 IMPLANT
TRAY FOLEY MTR SLVR 14FR STAT (SET/KITS/TRAYS/PACK) ×2 IMPLANT
WRAP KNEE MAXI GEL POST OP (GAUZE/BANDAGES/DRESSINGS) ×2 IMPLANT

## 2018-12-15 NOTE — Brief Op Note (Signed)
12/15/2018  9:42 AM  PATIENT:  Leighton Roach  73 y.o. female  PRE-OPERATIVE DIAGNOSIS:  left proximal leg abcess vs infection of total knee replacement  POST-OPERATIVE DIAGNOSIS:  left proximal leg abcess vs infection of total knee replacement  PROCEDURE:  Procedure(s) with comments: IRRIGATION AND DEBRIDEMENT left total knee with poly exchange (Left) -  SURGEON:  Surgeon(s) and Role:    Durene Romans, MD - Primary  PHYSICIAN ASSISTANT: Lanney Gins, PA-C  ANESTHESIA:   regional and spinal  EBL:  25 mL   BLOOD ADMINISTERED:none  DRAINS: none   LOCAL MEDICATIONS USED:  MARCAINE     SPECIMEN:  Source of Specimen:  X2 - 1. Left proximal medial soft tissue swelling, 2. left knee intra-articular  DISPOSITION OF SPECIMEN:  PATHOLOGY  COUNTS:  YES  TOURNIQUET:   Total Tourniquet Time Documented: Thigh (Left) - 43 minutes Total: Thigh (Left) - 43 minutes   DICTATION: .Other Dictation: Dictation Number [id number]  PLAN OF CARE: Admit to inpatient   PATIENT DISPOSITION:  PACU - hemodynamically stable.   Delay start of Pharmacological VTE agent (>24hrs) due to surgical blood loss or risk of bleeding: no

## 2018-12-15 NOTE — Consult Note (Signed)
  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 Olin  Active Problems:   Infection of prosthetic left knee joint (HCC)   . ALPRAZolam  1 mg Oral QHS  . [START ON 12/16/2018] apixaban  2.5 mg Oral Q12H  . [START ON 12/16/2018] atenolol  50 mg Oral Daily  . citalopram  40 mg Oral QPM  . [START ON 12/16/2018] dexamethasone  10 mg Intravenous Once  . docusate sodium  100 mg Oral BID  . ferrous sulfate  325 mg Oral BID WC  . furosemide  40 mg Oral Daily  . gabapentin  1,200 mg Oral QHS  . levothyroxine  112 mcg Oral QPM  . [START ON 12/16/2018] pantoprazole  40 mg Oral Daily  . polyethylene glycol  17 g Oral BID  . rifampin  300 mg Oral Q12H  . rOPINIRole  2 mg Oral QID    Recommendations:  continue antibiotics Stop rifampin - drug interactions Monitor culture picc line - already in place  Assessment: She has a complex history and concern for infection now s/p polyexchange and hope of knee salvage.  Not a good candidate for further surgery with significant bone loss.  I agree with 6 weeks of IV antibiotics and likely suppressive oral therapy after for a prolonged period (6 months)  Unable to use rifampin with Eliquis so will stop.    Antibiotics: Vancomycin and cefazolin - started after culture  HPI: [name] is a 72 y.o. female with a history of TKA and multiple surgeries who initially presented to Dr. Olin in 2018 with increased pain with activity and underwent 2 stage revision.  She was treated with cefazolin for 6 weeks and then underwent reimplantation about 4 months later in July 2018.  She was last seen in October 2018 until she returned with pain and swelling of the proximal medial tibia.  She underwent surgery today and noted no pus but cloudy fluid not typical for synovium.  She has had no recent fever or chills.  She also had previous complications of metal allergy, likely as part of the cause of failure  before.  CRP, ESR with minimal elevation.   Record in Epic independently reviewed and partly summarized as above.  Discussed with Dr. Olin  Review of Systems:  Constitutional: negative for fevers, chills and anorexia Gastrointestinal: negative for nausea and diarrhea Hematologic/lymphatic: negative for lymphadenopathy All other systems reviewed and are negative    Past Medical History:  Diagnosis Date  . A-fib (HCC)    PAROXYSMAL  AFIB ON ELIQUIS , MGD BY CARDIO DR [facility]AHID AT [facility]   . Anxiety   . Arthritis   . CHF (congestive heart failure) (HCC)    diastolic Hf  . Dyspnea    at times  . Dysrhythmia    a fib  . Family history of adverse reaction to anesthesia    Daughter hard to wake up  . GERD (gastroesophageal reflux disease)   . Headache    hx of  . History of hiatal hernia   . History of kidney stones   . Hyperlipidemia   . Hypertension   . Hypothyroidism   . Legally blind    per patient ,patient reports she can only see a blur of images;  needs visual guidance for day to day activities ;   . Palpitations   . Pneumonia   . Pulmonary HTN (HCC)      moderate, see ECHO care everywhere ; patient denies any increases repiratory effort today   . Restless leg syndrome   . Retinitis pigmentosa     Social History   Tobacco Use  . Smoking status: Former Smoker    Years: 28.00    Types: Cigarettes  . Smokeless tobacco: Never Used  . Tobacco comment: quit 30 years ago  Substance Use Topics  . Alcohol use: No  . Drug use: No    History reviewed. No pertinent family history.  Allergies  Allergen Reactions  . Amiodarone Other (See Comments)    Restless legs  . Benadryl [Diphenhydramine Hcl] Hives  . Metoprolol Other (See Comments)    Restless legs  . Septra [Sulfamethoxazole-Trimethoprim] Hives  . Other Rash    Metal    Physical Exam: Constitutional: in no apparent distress  Vitals:   12/15/18 1045 12/15/18 1127  BP: 114/70 111/75  Pulse: 75 77  Resp: 14  16  Temp:  (!) 97.5 F (36.4 C)  SpO2: 98% 98%   EYES: anicteric ENMT: no thrush Cardiovascular: Cor RRR Respiratory: CTA B; normal respiratory effort GI: soft Musculoskeletal: leg wrapped Skin: negatives: no rash Neuro:non-focal  Lab Results  Component Value Date   WBC 10.9 (H) 12/14/2018   HGB 11.5 (L) 12/14/2018   HCT 38.4 12/14/2018   MCV 94.1 12/14/2018   PLT 313 12/14/2018    Lab Results  Component Value Date   CREATININE 1.16 (H) 12/14/2018   BUN 24 (H) 12/14/2018   NA 138 12/14/2018   K 4.2 12/14/2018   CL 102 12/14/2018   CO2 27 12/14/2018   No results found for: ALT, AST, GGT, ALKPHOS   Microbiology: Recent Results (from the past 240 hour(s))  Aerobic/Anaerobic Culture (surgical/deep wound)     Status: None (Preliminary result)   Collection Time: 12/15/18  8:25 AM  Result Value Ref Range Status   Specimen Description   Final    TISSUE KNEE LEFT Performed at [facility], [address], [place], [place] [address]    Special Requests   Final    NONE Performed at [facility], [place] [address], [place], [place] [address]    Gram Stain   Final    RARE WBC PRESENT, PREDOMINANTLY PMN NO ORGANISMS SEEN Performed at [facility], [place] [address], [place], [place] [address]    Culture PENDING  Incomplete   Report Status PENDING  Incomplete  Aerobic/Anaerobic Culture (surgical/deep wound)     Status: None (Preliminary result)   Collection Time: 12/15/18  8:31 AM  Result Value Ref Range Status   Specimen Description FLUID SYNOVIAL KNEE LEFT  Final   Special Requests INTRA ARTICULAR  Final   Gram Stain   Final    FEW WBC PRESENT, PREDOMINANTLY PMN NO ORGANISMS SEEN Performed at [facility], [place] [address], [place], [place] [address]    Culture PENDING  Incomplete   Report Status PENDING  Incomplete    Thayer Headings, [email or web address] 12/15/2018, 11:38 AM

## 2018-12-15 NOTE — Anesthesia Procedure Notes (Signed)
Spinal  Start time: 12/15/2018 7:50 AM End time: 12/15/2018 7:52 AM Staffing Anesthesiologist: Shelton Silvas, MD Performed: anesthesiologist  Preanesthetic Checklist Completed: patient identified, site marked, surgical consent, pre-op evaluation, timeout performed, IV checked, risks and benefits discussed and monitors and equipment checked Spinal Block Patient position: sitting Prep: site prepped and draped and DuraPrep Location: L3-4 Injection technique: single-shot Needle Needle type: Pencan  Needle gauge: 24 G Needle length: 10 cm Needle insertion depth: 10 cm Additional Notes Patient tolerated well. No immediate complications.  Attempt x1 CRNA.

## 2018-12-15 NOTE — Transfer of Care (Signed)
Immediate Anesthesia Transfer of Care Note  Patient: [name]  Procedure(s) Performed: IRRIGATION AND DEBRIDEMENT left total knee with poly exchange (Left Knee)  Patient Location: PACU  Anesthesia Type:Spinal  Level of Consciousness: awake and patient cooperative  Airway & Oxygen Therapy: Patient Spontanous Breathing and Patient connected to face mask  Post-op Assessment: Report given to RN and Post -op Vital signs reviewed and stable  Post vital signs: Reviewed and stable  Last Vitals:  Vitals Value Taken Time  BP    Temp    Pulse 74 12/15/2018  9:52 AM  Resp    SpO2 100 % 12/15/2018  9:52 AM  Vitals shown include unvalidated device data.  Last Pain:  Vitals:   12/15/18 0605  TempSrc:   PainSc: 3       Patients Stated Pain Goal: 4 (12/15/18 0511)  Complications: No apparent anesthesia complications

## 2018-12-15 NOTE — Anesthesia Postprocedure Evaluation (Signed)
Anesthesia Post Note  Patient: [name]  Procedure(s) Performed: IRRIGATION AND DEBRIDEMENT left total knee with poly exchange (Left Knee)     Patient location during evaluation: PACU Anesthesia Type: Spinal Level of consciousness: oriented and awake and alert Pain management: pain level controlled Vital Signs Assessment: post-procedure vital signs reviewed and stable Respiratory status: spontaneous breathing, respiratory function stable and patient connected to nasal cannula oxygen Cardiovascular status: blood pressure returned to baseline and stable Postop Assessment: no headache, no backache and no apparent nausea or vomiting Anesthetic complications: no    Last Vitals:  Vitals:   12/15/18 1045 12/15/18 1127  BP: 114/70 111/75  Pulse: 75 77  Resp: 14 16  Temp:  (!) 36.4 C  SpO2: 98% 98%    Last Pain:  Vitals:   12/15/18 1127  TempSrc: Oral  PainSc:                  Shelton Silvas

## 2018-12-15 NOTE — Progress Notes (Signed)
Peripherally Inserted Central Catheter/Midline Placement  The IV Nurse has discussed with the patient and/or persons authorized to consent for the patient, the purpose of this procedure and the potential benefits and risks involved with this procedure.  The benefits include less needle sticks, lab draws from the catheter, and the patient may be discharged home with the catheter. Risks include, but not limited to, infection, bleeding, blood clot (thrombus formation), and puncture of an artery; nerve damage and irregular heartbeat and possibility to perform a PICC exchange if needed/ordered by physician.  Alternatives to this procedure were also discussed.  Bard Power PICC patient education guide, fact sheet on infection prevention and patient information card has been provided to patient /or left at bedside.    PICC/Midline Placement Documentation  PICC Single Lumen 11/12/16 PICC Right Brachial 38 cm 1 cm (Active)     PICC Single Lumen 12/15/18 PICC Right Basilic 35 cm 0 cm (Active)  Indication for Insertion or Continuance of Line Home intravenous therapies (PICC only) 12/15/2018  1:04 PM  Exposed Catheter (cm) 0 cm 12/15/2018  1:04 PM  Site Assessment Clean;Dry;Intact 12/15/2018  1:04 PM  Line Status Flushed;Blood return noted;Saline locked 12/15/2018  1:04 PM  Dressing Type Transparent 12/15/2018  1:04 PM  Dressing Status Clean;Dry;Intact;Antimicrobial disc in place 12/15/2018  1:04 PM  Dressing Change Due 12/22/18 12/15/2018  1:04 PM       Audrie Gallus 12/15/2018, 1:09 PM

## 2018-12-15 NOTE — Progress Notes (Signed)
Pharmacy Antibiotic Note  [name] is a 73 y.o. female presented to [facility] on 12/15/2018 for left knee I&D with ply exchange.  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  Today, 12/15/2018: - afeb, wbc 10.9 - scr 1.16 (crcl~41)  Pt received vancomycin 1gm IV x1 preop at 0830 on 4/28.  Plan: - vancomycin 500 mg IV x1 now to get 1500 mg total for today, then 1250 mg IV q48h for est AUC 495 - monitor renal function closely  _____________________________________  Height: 5\' 2"  (157.5 cm) Weight: 169 lb (76.7 kg) IBW/kg (Calculated) : 50.1  Temp (24hrs), Avg:97.9 F (36.6 C), Min:97.5 F (36.4 C), Max:98 F (36.7 C)  Recent Labs  Lab 12/14/18 1200  WBC 10.9*  CREATININE 1.16*    Estimated Creatinine Clearance: 41.4 mL/min (A) (by C-G formula based on SCr of 1.16 mg/dL (H)).    Allergies  Allergen Reactions  . Amiodarone Other (See Comments)    Restless legs  . Benadryl [Diphenhydramine Hcl] Hives  . Metoprolol Other (See Comments)    Restless legs  . Septra [Sulfamethoxazole-Trimethoprim] Hives  . Other Rash    Metal     Thank you for allowing pharmacy to be a part of this patient's care.  Lucia Gaskins 12/15/2018 12:20 PM

## 2018-12-15 NOTE — Interval H&P Note (Signed)
History and Physical Interval Note:  12/15/2018 7:40 AM  [name]  has presented today for surgery, with the diagnosis of left proximal leg abcess vs infection of total knee replacement.  The various methods of treatment have been discussed with the patient and family. After consideration of risks, benefits and other options for treatment, the patient has consented to  Procedure(s) with comments: IRRIGATION AND DEBRIDEMENT left total knee with poly exchange (Left) - as a surgical intervention.  The patient's history has been reviewed, patient examined, no change in status, stable for surgery.  I have reviewed the patient's chart and labs.  Questions were answered to the patient's satisfaction.     [name]

## 2018-12-15 NOTE — Op Note (Signed)
NAME: [name], [name]  OPERATIVE REPORT  DATE OF PROCEDURE:  12/15/2018  PREOPERATIVE DIAGNOSIS:  Left proximal tibia swelling after revision knee surgery with concern for onset of infection.  POSTOPERATIVE DIAGNOSIS:  Left proximal tibia swelling after revision knee surgery with concern for onset of infection, concern for left total knee infection.  PROCEDURE: 1.  Excisional debridement of left knee from a 10-inch incision including skin, subcutaneous tissue, nonviable scar tissue, as well as complete synovectomy done sharply with a knife as well as the Bovie cautery. 2.  Nonexcisional debridement with initially 3 L normal saline solution followed by 500 mL of chlorhexidine fluid, followed by another 3 L normal saline solution. 3.  Polyethylene exchange using a size 3/4 Genesis II 11 mm constrained insert.  SURGEON:  Durene RomansMatthew Sonyia Muro, Boyer  ASSISTANT:  Lanney GinsMatthew Babish, PA-C.  Note that Mr. Carmon SailsBabish was present for the entirety of the case from preoperative positioning, perioperative management of the operative extremity, general facilitation of the case and primary wound closure.  ANESTHESIA:  Regional plus spinal block.  SPECIMENS:  Fluid was taken from the proximal tibial swelling as well as intraarticular aspect of the joint and sent to pathology for evaluation.  DRAINS:  None.  COMPLICATIONS:  None.  ESTIMATED BLOOD LOSS:  Minimal.  TOURNIQUET:  Up for less than 60 minutes at 250 mmHg.  INDICATIONS:  The patient is a 73 year old female with a complex history involving her left knee.  She underwent her last revision surgery with me in 2019 due to pain.  There was concern with her management of her knee for metal allergy.  After her last  surgery, she had not been seen until the latter half of 2018 until she presented to the office yesterday with  increased pain and ____ was not on the proximal tibia.  There were no reported fevers.  In the office, this area was not able to be aspirated as  it was felt to be firm.  There was a recent history of a wrist fracture that could have resulted in some trauma to the knee that did not require evaluation.  Based on her history of her knee, I felt it was imperative that she go to the operating room  for exploration and washout of this at the very least.  Initially, I had ordered lab work that was to be ordered in our office, but the lab was closed related to COVID-19.  Then related issues with her husband, she was unable to get labs done at her  local physician's office.  They were hence ordered and performed in the preoperative visit, but on the day of the operation revealing a CRP value of 1.5 and a sedimentation rate of 68.  The risks, benefits and necessity of the procedure were outlined and  discussed.  The rationale of my treatment approach was reviewed with the patient and her daughter at the time.  Consent was obtained for management of her knee.  Consent was obtained.  PROCEDURE IN DETAIL:  The patient was brought to the operative theater.  Once adequate anesthesia, preoperative antibiotics were initially held until exposure and culture taken.  Once that was done, note that Ancef and vancomycin were administered.  The  left lower extremity was prepped and draped in sterile fashion.  A timeout was performed identifying the patient, the planned procedure and extremity.  The leg was exsanguinated, tourniquet elevated  to 250 mmHg.  Her old incision was excised.  Soft  tissue planes were created.  Care was taken to initially dissect out the area over the proximal medial tibia.  Once I had this area exposed nearly circumferentially, as I was dissecting the inferior aspect of it, it opened up, and there appeared to be  seroma-type fluid.  No obvious purulence.  I did take cultures of this individually with  culture swabs without an aspiration.  This was then further decompressed and the soft tissue mass removed.  At this point, I made a decision.  I needed to open up  her knee based on the inconclusive nature of this fluid.  I then made a median arthrotomy inside the joint.  She did have some joint fluid that was not normal-looking yellow synovial fluid.  For that reason, I did take this as a culture.  It did have a  brownish hemosiderin-type appearance consistent with seroma, but nonetheless based on his overall appearance, it needed to be evaluated.  Once I had aspirated this fluid and sent this off, culture swabs were also taken from the intraarticular inspection  of the knee.  I then sharply excised the medial and lateral gutters and synovium, the suprapatellar pouch and exposed the proximal tibia to allow for tibial subluxation.  With the tibia subluxated, the old polyethylene was removed.  Further debridement  in the posterior aspect of the knee was carried out.  I then irrigated the knee with 3 L of normal saline solution.  Note that after the initial debridement, I used about 500 mL on the proximal medial tibia before I opened up the knee.  I then used the  remaining 2-1/2 L on the knee initially.  I then used 500 mL of Irrisept fluid which is a chlorhexidine-based irrigant, and this sat in the knee for 5 minutes.  I then removed this and then irrigated the knee with another 3 L of normal saline solution.   At this point, we all changed gloves and removed most of the common instruments and then opened up the size 3/4 Genesis II 11 mm constrained liner which had been previously removed and impacted this into the knee.  At this point, the extensor mechanism  was reapproximated using #1 Vicryl and #1 Stratafix suture.  The remaining wound was closed with 2-0 Vicryl and a running Monocryl stitch.  The knee was then cleaned, dried and dressed sterilely using surgical glue and Aquacel dressing.  The patient  was  brought to the recovery room with the knee in an Ace wrap.  The findings were reviewed with the family.  I will consult infectious disease for further management.  We will place him on vancomycin and Ancef until cultures come back.  I will order a PICC  line for at least 6 weeks of IV antibiotics, if not longer.  We may also put her on rifampin based on retained implants.  LN/NUANCE  D:12/15/2018 T:12/15/2018 JOB:[id number]

## 2018-12-15 NOTE — Plan of Care (Signed)

## 2018-12-15 NOTE — Anesthesia Procedure Notes (Signed)
Procedure Name: MAC Date/Time: 12/15/2018 7:45 AM Performed by: Silas Sacramento, CRNA Pre-anesthesia Checklist: Patient identified, Emergency Drugs available, Suction available and Patient being monitored Oxygen Delivery Method: Simple face mask

## 2018-12-15 NOTE — Anesthesia Procedure Notes (Signed)
Anesthesia Regional Block: Adductor canal block   Pre-Anesthetic Checklist: ,, timeout performed, Correct Patient, Correct Site, Correct Laterality, Correct Procedure, Correct Position, site marked, Risks and benefits discussed,  Surgical consent,  Pre-op evaluation,  At surgeon's request and post-op pain management  Laterality: Left  Prep: chloraprep       Needles:  Injection technique: Single-shot  Needle Type: Echogenic Stimulator Needle     Needle Length: 9cm  Needle Gauge: 21     Additional Needles:   Procedures:,,,, ultrasound used (permanent image in chart),,,,  Narrative:  Start time: 12/15/2018 7:25 AM End time: 12/15/2018 7:30 AM Injection made incrementally with aspirations every 5 mL.  Performed by: Personally  Anesthesiologist: Shelton Silvas, MD  Additional Notes: Patient tolerated the procedure well. Local anesthetic introduced in an incremental fashion under minimal resistance after negative aspirations. No paresthesias were elicited. After completion of the procedure, no acute issues were identified and patient continued to be monitored by RN.

## 2018-12-16 ENCOUNTER — Encounter (HOSPITAL_COMMUNITY): Payer: Self-pay | Admitting: Orthopedic Surgery

## 2018-12-16 DIAGNOSIS — T8450XA Infection and inflammatory reaction due to unspecified internal joint prosthesis, initial encounter: Secondary | ICD-10-CM

## 2018-12-16 LAB — CBC
HCT: 31 % — ABNORMAL LOW (ref 36.0–46.0)
Hemoglobin: 9 g/dL — ABNORMAL LOW (ref 12.0–15.0)
MCH: 27.8 pg (ref 26.0–34.0)
MCHC: 29 g/dL — ABNORMAL LOW (ref 30.0–36.0)
MCV: 95.7 fL (ref 80.0–100.0)
Platelets: 248 10*3/uL (ref 150–400)
RBC: 3.24 MIL/uL — ABNORMAL LOW (ref 3.87–5.11)
RDW: 16 % — ABNORMAL HIGH (ref 11.5–15.5)
WBC: 9.8 10*3/uL (ref 4.0–10.5)
nRBC: 0 % (ref 0.0–0.2)

## 2018-12-16 LAB — BASIC METABOLIC PANEL
Anion gap: 6 (ref 5–15)
BUN: 20 mg/dL (ref 8–23)
CO2: 27 mmol/L (ref 22–32)
Calcium: 7.8 mg/dL — ABNORMAL LOW (ref 8.9–10.3)
Chloride: 105 mmol/L (ref 98–111)
Creatinine, Ser: 1.02 mg/dL — ABNORMAL HIGH (ref 0.44–1.00)
GFR calc Af Amer: >60 mL/min (ref >60)
GFR calc non Af Amer: 55 mL/min — ABNORMAL LOW (ref >60)
Glucose, Bld: 173 mg/dL — ABNORMAL HIGH (ref 70–99)
Potassium: 4.5 mmol/L (ref 3.5–5.1)
Sodium: 138 mmol/L (ref 135–145)

## 2018-12-16 MED ORDER — SODIUM CHLORIDE 0.9 % IV SOLN
2.0000 g | INTRAVENOUS | Status: DC
  Administered 2018-12-16 – 2018-12-18 (×3): 2 g via INTRAVENOUS
  Filled 2018-12-16: qty 20
  Filled 2018-12-16: qty 2
  Filled 2018-12-16: qty 20

## 2018-12-16 MED ORDER — VANCOMYCIN HCL IN DEXTROSE 750-5 MG/150ML-% IV SOLN
750.0000 mg | INTRAVENOUS | Status: DC
  Administered 2018-12-16 – 2018-12-18 (×3): 750 mg via INTRAVENOUS
  Filled 2018-12-16 (×4): qty 150

## 2018-12-16 NOTE — Progress Notes (Signed)
Pharmacy Antibiotic Note  [name] is a 73 y.o. female presented to [facility] on 12/15/2018 for left knee I&D with ply exchange.  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  Today, 12/16/2018: - day # 2 abx - afeb, wbc wnl - scr down 1.06 (crcl~47) - ID recommends 6 weeks if IV abx, then likely for another 6 months with PO abx  Pt received vancomycin 1gm IV x1 preop at 0830 on 4/28.  Plan: - adjust vancomycin dose to 750 mg IV q24h for est AUC 522 - ceftriaxone 2gm IV q24h - monitor renal function closely  _____________________________________  Height: 5\' 2"  (157.5 cm) Weight: 169 lb (76.7 kg) IBW/kg (Calculated) : 50.1  Temp (24hrs), Avg:97.7 F (36.5 C), Min:97.3 F (36.3 C), Max:98.4 F (36.9 C)  Recent Labs  Lab 12/14/18 1200 12/16/18 0359  WBC 10.9* 9.8  CREATININE 1.16* 1.02*    Estimated Creatinine Clearance: 47.1 mL/min (A) (by C-G formula based on SCr of 1.02 mg/dL (H)).    Allergies  Allergen Reactions  . Amiodarone Other (See Comments)    Restless legs  . Benadryl [Diphenhydramine Hcl] Hives  . Metoprolol Other (See Comments)    Restless legs  . Septra [Sulfamethoxazole-Trimethoprim] Hives  . Other Rash    Metal     Thank you for allowing pharmacy to be a part of this patient's care.  Lucia Gaskins 12/16/2018 10:42 AM

## 2018-12-16 NOTE — Progress Notes (Signed)
    [facility]   Reason for visit: Follow up on probable PJI  Interval History: no associated rash, diarrhea.  No fever.  WBC wnl.  No complaints.  Feels well overall.    Physical Exam: Constitutional:  Vitals:   12/16/18 1018 12/16/18 1218  BP: 120/62   Pulse: 60   Resp: 18   Temp: 98 F (36.7 C)   SpO2: 100% 97%   patient appears in NAD Eyes: anicteric Respiratory: Normal respiratory effort; CTA B Cardiovascular: RRR GI: soft, nt, nd  Review of Systems: Constitutional: negative for fevers and chills Respiratory: negative for cough Integument/breast: negative for rash  Lab Results  Component Value Date   WBC 9.8 12/16/2018   HGB 9.0 (L) 12/16/2018   HCT 31.0 (L) 12/16/2018   MCV 95.7 12/16/2018   PLT 248 12/16/2018    Lab Results  Component Value Date   CREATININE 1.02 (H) 12/16/2018   BUN 20 12/16/2018   NA 138 12/16/2018   K 4.5 12/16/2018   CL 105 12/16/2018   CO2 27 12/16/2018   No results found for: ALT, AST, GGT, ALKPHOS   Microbiology: Recent Results (from the past 240 hour(s))  Aerobic/Anaerobic Culture (surgical/deep wound)     Status: None (Preliminary result)   Collection Time: 12/15/18  8:25 AM  Result Value Ref Range Status   Specimen Description   Final    TISSUE KNEE LEFT Performed at [facility], [address] [place], [place] [address]    Special Requests   Final    NONE Performed at [facility], [address] [place], [place] [address]    Gram Stain   Final    RARE WBC PRESENT, PREDOMINANTLY PMN NO ORGANISMS SEEN    Culture   Final    NO GROWTH 1 DAY Performed at [facility], [address] [place], [place] [address]    Report Status PENDING  Incomplete  Aerobic/Anaerobic Culture (surgical/deep wound)     Status: None (Preliminary result)   Collection Time: 12/15/18  8:31 AM  Result Value Ref Range Status   Specimen Description FLUID SYNOVIAL KNEE LEFT  Final    Special Requests INTRA ARTICULAR  Final   Gram Stain   Final    FEW WBC PRESENT, PREDOMINANTLY PMN NO ORGANISMS SEEN    Culture   Final    NO GROWTH 1 DAY Performed at [facility], [address], [place], [place] [address]    Report Status PENDING  Incomplete    Impression/Plan:  1. 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  S/P polyexchange but some retained hardware.   I have her on vancomycin and ceftriaxone and plan on 6 weeks IV.  Will consider continuation with oral therapy.   Unable to use rifampin due to Eliquis.  I will place OPAT consult. Will continue to monitor the culture  2.  Medication monitoring - will need weekly labs per home health protocol  3.  Screening - will do routine screening for HIV and hepatitis C.

## 2018-12-16 NOTE — Plan of Care (Signed)
Patient is progressing in all areas. 

## 2018-12-16 NOTE — Evaluation (Signed)
Physical Therapy Evaluation Patient Details Name: [name] MRN: [id number] DOB: 10-20-1945 Today's Date: 12/16/2018   History of Present Illness  Patient is a 73 y/o female s/p Excisional debridement of left knee on 12/15/2018. 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    Clinical Impression  Patient admitted s/p above listed procedure. Patient reports requiring assist from daughters prior to admission due to low vision. Patient today performing bed mobility, transfers, and gait with RW with Min A for steadying, sequencing, safety, and general set-up. Discussion of safety as well as no pillow under knee to promote extension. Will recommend HHPT at discharge. PT to continue to follow acutely.     Follow Up Recommendations Home health PT;Supervision/Assistance - 24 hour    Equipment Recommendations  None recommended by PT    Recommendations for Other Services       Precautions / Restrictions Precautions Precautions: Fall;Knee Restrictions Weight Bearing Restrictions: No Other Position/Activity Restrictions: WBAT      Mobility  Bed Mobility Overal bed mobility: Needs Assistance Bed Mobility: Supine to Sit     Supine to sit: Min guard     General bed mobility comments: for safety; set-up required due to low vision  Transfers Overall transfer level: Needs assistance Equipment used: Rolling walker (2 wheeled) Transfers: Sit to/from Stand Sit to Stand: Min assist         General transfer comment: sit to stand from bedside and toilet with Min A - cueing for set-up and placement  Ambulation/Gait Ambulation/Gait assistance: Min assist Gait Distance (Feet): 80 Feet Assistive device: Rolling walker (2 wheeled) Gait Pattern/deviations: Step-to pattern;Step-through pattern;Decreased stance time - left;Decreased weight shift to left Gait velocity: decreased   General Gait Details: required assist for navigating RW due to low  vision; light Min A for steadying  Stairs            Wheelchair Mobility    Modified Rankin (Stroke Patients Only)       Balance Overall balance assessment: Mild deficits observed, not formally tested                                           Pertinent Vitals/Pain Pain Assessment: 0-10 Pain Score: 4  Pain Location: L knee Pain Descriptors / Indicators: Aching;Discomfort Pain Intervention(s): Limited activity within patient's tolerance;Monitored during session;Repositioned    Home Living Family/patient expects to be discharged to:: Private residence Living Arrangements: Spouse/significant other;Children Available Help at Discharge: Family;Available 24 hours/day Type of Home: House Home Access: Stairs to enter Entrance Stairs-Rails: None Entrance Stairs-Number of Steps: (1 then 1) Home Layout: One level Home Equipment: Bedside commode;Walker - 2 wheels;Cane - single point;Wheelchair - manual      Prior Function Level of Independence: Needs assistance   Gait / Transfers Assistance Needed: had been ambulating with cane intermittently prior to fall  ADL's / Homemaking Assistance Needed: daughters assist with meals  Comments: legally blind     Hand Dominance        Extremity/Trunk Assessment   Upper Extremity Assessment Upper Extremity Assessment: LUE deficits/detail LUE Deficits / Details: wrist brace applied - reports history of surgical fixation    Lower Extremity Assessment Lower Extremity Assessment: LLE deficits/detail LLE Deficits / Details: expected post op pain and weakness    Cervical / Trunk Assessment Cervical / Trunk Assessment: Normal  Communication  Communication: No difficulties  Cognition Arousal/Alertness: Awake/alert Behavior During Therapy: WFL for tasks assessed/performed Overall Cognitive Status: Within Functional Limits for tasks assessed                                        General  Comments      Exercises     Assessment/Plan    PT Assessment Patient needs continued PT services  PT Problem List Decreased strength;Decreased range of motion;Decreased activity tolerance;Decreased balance;Decreased mobility;Decreased knowledge of use of DME;Decreased safety awareness       PT Treatment Interventions DME instruction;Gait training;Stair training;Functional mobility training;Therapeutic activities;Therapeutic exercise;Balance training;Patient/family education    PT Goals (Current goals can be found in the Care Plan section)  Acute Rehab PT Goals Patient Stated Goal: regain mobility; reduce pain PT Goal Formulation: With patient Time For Goal Achievement: 12/30/18 Potential to Achieve Goals: Good    Frequency Min 5X/week   Barriers to discharge        Co-evaluation               AM-PAC PT "6 Clicks" Mobility  Outcome Measure Help needed turning from your back to your side while in a flat bed without using bedrails?: A Little Help needed moving from lying on your back to sitting on the side of a flat bed without using bedrails?: A Little Help needed moving to and from a bed to a chair (including a wheelchair)?: A Little Help needed standing up from a chair using your arms (e.g., wheelchair or bedside chair)?: A Little Help needed to walk in hospital room?: A Little Help needed climbing 3-5 steps with a railing? : A Lot 6 Click Score: 17    End of Session Equipment Utilized During Treatment: Gait belt Activity Tolerance: Patient tolerated treatment well Patient left: in chair;with call bell/phone within reach;with chair alarm set Nurse Communication: Mobility status PT Visit Diagnosis: Unsteadiness on feet (R26.81);Other abnormalities of gait and mobility (R26.89);Muscle weakness (generalized) (M62.81)    Time: 0820-0903 PT Time Calculation (min) (ACUTE ONLY): 43 min   Charges:   PT Evaluation $PT Eval Moderate Complexity: 1 Mod PT  Treatments $Gait Training: 8-22 mins $Therapeutic Activity: 8-22 mins         Kipp LaurenceStephanie R Asencion Loveday, PT, DPT Supplemental Physical Therapist 12/16/18 9:52 AM Pager: [phone] Office: [phone]

## 2018-12-17 LAB — CBC
HCT: 30.2 % — ABNORMAL LOW (ref 36.0–46.0)
Hemoglobin: 8.8 g/dL — ABNORMAL LOW (ref 12.0–15.0)
MCH: 27.9 pg (ref 26.0–34.0)
MCHC: 29.1 g/dL — ABNORMAL LOW (ref 30.0–36.0)
MCV: 95.9 fL (ref 80.0–100.0)
Platelets: 223 10*3/uL (ref 150–400)
RBC: 3.15 MIL/uL — ABNORMAL LOW (ref 3.87–5.11)
RDW: 16.4 % — ABNORMAL HIGH (ref 11.5–15.5)
WBC: 11.1 10*3/uL — ABNORMAL HIGH (ref 4.0–10.5)
nRBC: 0 % (ref 0.0–0.2)

## 2018-12-17 LAB — BASIC METABOLIC PANEL
Anion gap: 5 (ref 5–15)
BUN: 23 mg/dL (ref 8–23)
CO2: 27 mmol/L (ref 22–32)
Calcium: 7.7 mg/dL — ABNORMAL LOW (ref 8.9–10.3)
Chloride: 106 mmol/L (ref 98–111)
Creatinine, Ser: 1.1 mg/dL — ABNORMAL HIGH (ref 0.44–1.00)
GFR calc Af Amer: 58 mL/min — ABNORMAL LOW (ref >60)
GFR calc non Af Amer: 50 mL/min — ABNORMAL LOW (ref >60)
Glucose, Bld: 126 mg/dL — ABNORMAL HIGH (ref 70–99)
Potassium: 4.2 mmol/L (ref 3.5–5.1)
Sodium: 138 mmol/L (ref 135–145)

## 2018-12-17 LAB — HIV ANTIBODY (ROUTINE TESTING W REFLEX): HIV Screen 4th Generation wRfx: NONREACTIVE

## 2018-12-17 LAB — HEPATITIS C ANTIBODY: HCV Ab: <0.1 s/co ratio (ref 0.0–0.9)

## 2018-12-17 MED ORDER — HYDROCODONE-ACETAMINOPHEN 7.5-325 MG PO TABS: 1.0000 | ORAL_TABLET | ORAL | 0 refills | Status: DC | PRN

## 2018-12-17 MED ORDER — METHOCARBAMOL 500 MG PO TABS: 500.0000 mg | ORAL_TABLET | Freq: Four times a day (QID) | ORAL | 0 refills | Status: DC | PRN

## 2018-12-17 MED ORDER — POLYETHYLENE GLYCOL 3350 17 G PO PACK: 17.0000 g | PACK | Freq: Two times a day (BID) | ORAL | 0 refills | Status: DC

## 2018-12-17 MED ORDER — FERROUS SULFATE 325 (65 FE) MG PO TABS: 325.0000 mg | ORAL_TABLET | Freq: Three times a day (TID) | ORAL | 3 refills | Status: DC

## 2018-12-17 MED ORDER — DOCUSATE SODIUM 100 MG PO CAPS: 100.0000 mg | ORAL_CAPSULE | Freq: Two times a day (BID) | ORAL | 0 refills | Status: DC

## 2018-12-17 NOTE — Progress Notes (Signed)
     Subjective: 1 Day Post-Op Procedure(s) (LRB): IRRIGATION AND DEBRIDEMENT left total knee with poly exchange (Left)   Patient reports pain as mild, controlled with medications.  No reported events throughout the night.  She asked and the procedure was described in detail.  We also discussed the plan moving forward. Plan on keep a few days to allow ID to evaluate the lab results and determine to proper treatments moving forward.      Objective:   VITALS:    12/16/18  BP: (!) 113/59  Pulse: 72  Resp: 18  Temp: 98.1 F (36.7 C)  SpO2: 98%    Dorsiflexion/Plantar flexion intact Incision: dressing C/D/I No cellulitis present Compartment soft  LABS   12/14/18 1200 12/16/18 0359  HGB 11.5* 9.0*  HCT 38.4 31.0*  WBC 10.9* 9.8  PLT 313 248      12/14/18 1200 12/16/18 0359  NA 138 138  K 4.2 4.5  BUN 24* 20  CREATININE 1.16* 1.02*  GLUCOSE 110* 173*     Assessment/Plan: 1 Days Post-Op Procedure(s) (LRB): IRRIGATION AND DEBRIDEMENT left total knee with poly exchange (Left)   Advance diet Up with therapy D/C IV fluids Discharge home with home health when ready Appreciate ID and their input on treating this patient   [name]. [name]   PAC  12/16/2018, 10:50 AM

## 2018-12-17 NOTE — Plan of Care (Signed)
  Problem: Coping: Goal: Level of anxiety will decrease Outcome: Progressing   Problem: Pain Managment: Goal: General experience of comfort will improve Outcome: Progressing   Problem: Safety: Goal: Ability to remain free from injury will improve Outcome: Progressing   Problem: Nutrition: Goal: Adequate nutrition will be maintained Outcome: Progressing   Problem: Activity: Goal: Risk for activity intolerance will decrease Outcome: Progressing

## 2018-12-17 NOTE — Progress Notes (Signed)
PHARMACY CONSULT NOTE FOR:  OUTPATIENT  PARENTERAL ANTIBIOTIC THERAPY (OPAT)  Indication: PJI Regimen:  1) Ceftriaxone 2gm IV q24h 2) vancomycin 750 mg IV q24h End date: 01/25/2019  IV antibiotic discharge orders are pended. To discharging provider:  please sign these orders via discharge navigator,  Select New Orders & click on the button choice - Manage This Unsigned Work.     Thank you for allowing pharmacy to be a part of this patient's care.  Lucia Gaskins 12/17/2018, 10:36 AM

## 2018-12-17 NOTE — Progress Notes (Signed)
Patient ID: [name], female   DOB: [birth year], 73 y.o.   MRN: [id number] Subjective: 2 Days Post-Op Procedure(s) (LRB): IRRIGATION AND DEBRIDEMENT left total knee with poly exchange (Left)    Patient reports pain as moderate. No events.  Reports having been up with therapy yesterday  Objective:   VITALS:   Vitals:   12/16/18 2125 12/17/18 0609  BP: (!) 121/53 (!) 105/50  Pulse: 68 (!) 59  Resp: 16 16  Temp: 98.5 F (36.9 C) 98 F (36.7 C)  SpO2: 94% 94%    Neurovascular intact Incision: dressing C/D/I - Left knee  LABS Recent Labs    12/14/18 1200 12/16/18 0359 12/17/18 0420  HGB 11.5* 9.0* 8.8*  HCT 38.4 31.0* 30.2*  WBC 10.9* 9.8 11.1*  PLT 313 248 223    Recent Labs    12/14/18 1200 12/16/18 0359 12/17/18 0420  NA 138 138 138  K 4.2 4.5 4.2  BUN 24* 20 23  CREATININE 1.16* 1.02* 1.10*  GLUCOSE 110* 173* 126*    Recent Labs    12/14/18 1200  INR 0.9     Assessment/Plan: 2 Days Post-Op Procedure(s) (LRB): IRRIGATION AND DEBRIDEMENT left total knee with poly exchange (Left)   Advance diet Up with therapy Continue ABX therapy due to infected left total knee PJI Plan for discharge tomorrow  Vanc and Ceftriaxone now per ID - likely to be discharged on both Continue PT  Will need HHPT HHRN for IV antibiotic administration and instruction for family

## 2018-12-17 NOTE — Progress Notes (Signed)
Physical Therapy Treatment Patient Details Name: [name] MRN: [id number] DOB: 05/31/1946 Today's Date: 12/17/2018    History of Present Illness Patient is a 73 y/o female s/p Excisional debridement of left knee on 12/15/2018. 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    PT Comments    [facility] # [facility].  General transfer comment: Min Assist due to low vision.  [facility] hand over hand assist for safety General Gait Details: required assist for navigating RW due to low vision; light Min A for steadying Pt plans to D/C to home with family support.   Follow Up Recommendations  Home health PT;Supervision/Assistance - 24 hour     Equipment Recommendations  None recommended by PT    Recommendations for Other Services       Precautions / Restrictions Precautions Precautions: Fall;Knee Precaution Comments: legally blind Restrictions Weight Bearing Restrictions: No Other Position/Activity Restrictions: WBAT    Mobility  Bed Mobility Overal bed mobility: Needs Assistance Bed Mobility: Supine to Sit;Sit to Supine     Supine to sit: Min guard Sit to supine: Min guard   General bed mobility comments: increased time and direction due to low vision  Transfers Overall transfer level: Needs assistance Equipment used: Rolling walker (2 wheeled) Transfers: Sit to/from [employer]Stand;Stand Pivot Transfers Sit to Stand: Min assist Stand pivot transfers: Min assist       General transfer comment: Min Assist due to low vision.  [facility] for [facility]   Ambulation/Gait Ambulation/Gait assistance: Min assist Gait Distance (Feet): 95 Feet Assistive device: Rolling walker (2 wheeled) Gait Pattern/deviations: Step-to pattern;Step-through pattern Gait velocity: decreased   General Gait Details: required assist for navigating RW due to low vision; light  Min A for steadying   Stairs             Wheelchair Mobility    Modified Rankin (Stroke Patients Only)       Balance                                            Cognition Arousal/Alertness: Awake/alert Behavior During Therapy: WFL for tasks assessed/performed Overall Cognitive Status: Within Functional Limits for tasks assessed                                 General Comments: motivated      Exercises      General Comments        Pertinent Vitals/Pain Pain Assessment: 0-10 Pain Score: 3  Pain Location: L knee Pain Descriptors / Indicators: Aching;Discomfort Pain Intervention(s): Monitored during session;Repositioned    Home Living                      Prior Function            PT Goals (current goals can now be found in the care plan section) Progress towards PT goals: Progressing toward goals    Frequency    Min 5X/week      PT Plan Current plan remains appropriate    Co-evaluation              AM-PAC PT "6 Clicks" Mobility   Outcome  Measure  Help needed turning from your back to your side while in a flat bed without using bedrails?: A Little Help needed moving from lying on your back to sitting on the side of a flat bed without using bedrails?: A Little Help needed moving to and from a bed to a chair (including a wheelchair)?: A Little Help needed standing up from a chair using your arms (e.g., wheelchair or bedside chair)?: A Little Help needed to walk in hospital room?: A Little Help needed climbing 3-5 steps with a railing? : A Lot 6 Click Score: 17    End of Session Equipment Utilized During Treatment: Gait belt Activity Tolerance: Patient tolerated treatment well Patient left: in bed;with call bell/phone within reach Nurse Communication: Mobility status PT Visit Diagnosis: Unsteadiness on feet (R26.81);Other abnormalities of gait and mobility (R26.89);Muscle weakness (generalized)  (M62.81)     Time: 1440-1510 PT Time Calculation (min) (ACUTE ONLY): 30 min  Charges:  $Gait Training: 8-22 mins $Therapeutic Activity: 8-22 mins                     Felecia Shelling  PTA Acute  Rehabilitation Services Pager      [phone] Office      [phone]

## 2018-12-18 ENCOUNTER — Inpatient Hospital Stay (HOSPITAL_COMMUNITY): Payer: Medicare Other

## 2018-12-18 LAB — CBC
HCT: 31.5 % — ABNORMAL LOW (ref 36.0–46.0)
Hemoglobin: 8.9 g/dL — ABNORMAL LOW (ref 12.0–15.0)
MCH: 27.5 pg (ref 26.0–34.0)
MCHC: 28.3 g/dL — ABNORMAL LOW (ref 30.0–36.0)
MCV: 97.2 fL (ref 80.0–100.0)
Platelets: 244 10*3/uL (ref 150–400)
RBC: 3.24 MIL/uL — ABNORMAL LOW (ref 3.87–5.11)
RDW: 16.8 % — ABNORMAL HIGH (ref 11.5–15.5)
WBC: 9.1 10*3/uL (ref 4.0–10.5)
nRBC: 0 % (ref 0.0–0.2)

## 2018-12-18 LAB — BASIC METABOLIC PANEL
Anion gap: 5 (ref 5–15)
BUN: 25 mg/dL — ABNORMAL HIGH (ref 8–23)
CO2: 26 mmol/L (ref 22–32)
Calcium: 7.6 mg/dL — ABNORMAL LOW (ref 8.9–10.3)
Chloride: 108 mmol/L (ref 98–111)
Creatinine, Ser: 1.18 mg/dL — ABNORMAL HIGH (ref 0.44–1.00)
GFR calc Af Amer: 53 mL/min — ABNORMAL LOW (ref >60)
GFR calc non Af Amer: 46 mL/min — ABNORMAL LOW (ref >60)
Glucose, Bld: 105 mg/dL — ABNORMAL HIGH (ref 70–99)
Potassium: 3.8 mmol/L (ref 3.5–5.1)
Sodium: 139 mmol/L (ref 135–145)

## 2018-12-18 MED ORDER — HEPARIN SOD (PORK) LOCK FLUSH 100 UNIT/ML IV SOLN
250.0000 [IU] | INTRAVENOUS | Status: AC | PRN
  Administered 2018-12-18: 14:00:00 250 [IU]

## 2018-12-18 MED ORDER — CEFTRIAXONE IV (FOR PTA / DISCHARGE USE ONLY): 2.0000 g | INTRAVENOUS | 0 refills | Status: AC

## 2018-12-18 MED ORDER — VANCOMYCIN IV (FOR PTA / DISCHARGE USE ONLY): 750.0000 mg | INTRAVENOUS | 0 refills | Status: AC

## 2018-12-18 NOTE — Care Management Important Message (Signed)
Important Message  Patient Details  Name: INDICA SOKOLSKI MRN: [id number] Date of Birth: 12/01/1945   Medicare Important Message Given:  Yes    Caren Macadam 12/18/2018, 9:55 AMImportant Message  Patient Details  Name: MISTI HOYE MRN: [id number] Date of Birth: Aug 30, 1945   Medicare Important Message Given:  Yes    Caren Macadam 12/18/2018, 9:55 AM

## 2018-12-18 NOTE — Progress Notes (Signed)
Patient feeling short of breath. Paged on call.

## 2018-12-18 NOTE — Progress Notes (Signed)
Patient ID: [name], female   DOB: [birth year], 73 y.o.   MRN: [id number] Subjective: 3 Days Post-Op Procedure(s) (LRB): IRRIGATION AND DEBRIDEMENT left total knee with poly exchange (Left)    Patient reports pain as mild.  Had episode of SOB last night but feeling better now.  No other events noted  Objective:   VITALS:   Vitals:   12/17/18 2223 12/18/18 0457  BP: (!) 98/53 111/71  Pulse: (!) 58 72  Resp: 20 20  Temp: 98 F (36.7 C) 97.9 F (36.6 C)  SpO2: 96% 96%    Neurovascular intact Incision: dressing C/D/I  LABS Recent Labs    12/16/18 0359 12/17/18 0420 12/18/18 0313  HGB 9.0* 8.8* 8.9*  HCT 31.0* 30.2* 31.5*  WBC 9.8 11.1* 9.1  PLT 248 223 244    Recent Labs    12/16/18 0359 12/17/18 0420 12/18/18 0313  NA 138 138 139  K 4.5 4.2 3.8  BUN 20 23 25*  CREATININE 1.02* 1.10* 1.18*  GLUCOSE 173* 126* 105*    No results for input(s): LABPT, INR in the last 72 hours.   Assessment/Plan: 3 Days Post-Op Procedure(s) (LRB): IRRIGATION AND DEBRIDEMENT left total knee with poly exchange (Left)   Up with therapy Discharge home with home health - today RTC in 2 weeks  Home with HHPT to work on ROM LLE and mobility Home with IV antibiotics for 6 weeks (Vanco, and Ceftriaxone)  Goals of therapy reviewed

## 2018-12-18 NOTE — Care Management Important Message (Signed)
Important Message  Patient Details IM Letter given to Marcelle Smiling to present to the Patient Name: [name] MRN: [id number] Date of Birth: 04/29/1946   Medicare Important Message Given:  Yes    Caren Macadam 12/18/2018, 9:56 AM

## 2018-12-18 NOTE — Discharge Instructions (Signed)

## 2018-12-18 NOTE — Progress Notes (Signed)
Physical Therapy Treatment Patient Details Name: [name] MRN: [id number] DOB: Oct 05, 1945 Today's Date: 12/18/2018    History of Present Illness Patient is a 73 y/o female s/p Excisional debridement of left knee on 12/15/2018. 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    PT Comments    POD # 3 Assisted OOB to amb a greater distance in hallway.  Practiced one step pt has to enter her home.   Follow Up Recommendations  Home health PT;Supervision/Assistance - 24 hour     Equipment Recommendations  None recommended by PT    Recommendations for Other Services       Precautions / Restrictions Precautions Precautions: Fall;Knee Precaution Comments: legally blind Restrictions Weight Bearing Restrictions: No Other Position/Activity Restrictions: WBAT    Mobility  Bed Mobility Overal bed mobility: Modified Independent             General bed mobility comments: increased time and direction due to low vision  Transfers   Equipment used: Rolling walker (2 wheeled) Transfers: Sit to/from [employer]Stand;Stand Pivot Transfers Sit to Stand: Min guard Stand pivot transfers: Min guard       General transfer comment: safety due to low vision.  Good use of hands of hands to steady self.   Ambulation/Gait Ambulation/Gait assistance: Supervision;Min guard Gait Distance (Feet): 110 Feet Assistive device: Rolling walker (2 wheeled) Gait Pattern/deviations: Step-to pattern;Step-through pattern Gait velocity: decreased   General Gait Details: required assist for navigating RW due to low vision; tolerated well   Stairs Stairs: Yes Stairs assistance: Min guard Stair Management: No rails Number of Stairs: 1 General stair comments: assist due to low vision   Wheelchair Mobility    Modified Rankin (Stroke Patients Only)       Balance                                            Cognition Arousal/Alertness:  Awake/alert Behavior During Therapy: WFL for tasks assessed/performed Overall Cognitive Status: Within Functional Limits for tasks assessed                                 General Comments: motivated      Exercises      General Comments        Pertinent Vitals/Pain Pain Assessment: 0-10 Pain Score: 6  Pain Location: L knee Pain Descriptors / Indicators: Aching;Discomfort Pain Intervention(s): Monitored during session;Patient requesting pain meds-RN notified;Repositioned    Home Living                      Prior Function            PT Goals (current goals can now be found in the care plan section) Progress towards PT goals: Progressing toward goals    Frequency    Min 5X/week      PT Plan Current plan remains appropriate    Co-evaluation              AM-PAC PT "6 Clicks" Mobility   Outcome Measure  Help needed turning from your back to your side while in a flat bed without using bedrails?: None Help needed moving from lying on your back to sitting on the side of a flat bed without using bedrails?: None Help needed  moving to and from a bed to a chair (including a wheelchair)?: A Little Help needed standing up from a chair using your arms (e.g., wheelchair or bedside chair)?: A Little Help needed to walk in hospital room?: A Little Help needed climbing 3-5 steps with a railing? : A Little 6 Click Score: 20    End of Session Equipment Utilized During Treatment: Gait belt Activity Tolerance: Patient tolerated treatment well Patient left: in bed;with call bell/phone within reach Nurse Communication: (pt ready for D/C to home) PT Visit Diagnosis: Unsteadiness on feet (R26.81);Other abnormalities of gait and mobility (R26.89);Muscle weakness (generalized) (M62.81)     Time: 3244-0102 PT Time Calculation (min) (ACUTE ONLY): 25 min  Charges:  $Gait Training: 8-22 mins $Therapeutic Activity: 8-22 mins                     Felecia Shelling  PTA Acute  Rehabilitation Services Pager      [phone] Office      [phone]

## 2018-12-18 NOTE — Plan of Care (Signed)
Patient discharged home in stable condition. PICC line was prepared for home usage by IV team

## 2018-12-20 LAB — AEROBIC/ANAEROBIC CULTURE W GRAM STAIN (SURGICAL/DEEP WOUND): Culture: NO GROWTH

## 2018-12-20 LAB — AEROBIC/ANAEROBIC CULTURE (SURGICAL/DEEP WOUND): Culture: NO GROWTH

## 2018-12-21 NOTE — Discharge Summary (Signed)
Physician Discharge Summary  Patient ID: [name] MRN: [id number] DOB/AGE: June 01, 1946 73 y.o.  Admit date: 12/15/2018 Discharge date: 12/18/2018   Procedures:  Procedure(s) (LRB): IRRIGATION AND DEBRIDEMENT left total knee with poly exchange (Left)  Attending Physician:  Dr. Durene Romans   Admission Diagnoses:   Left proximal leg abscess vs infection of TK revision  Discharge Diagnoses:  Active Problems:   Infection of prosthetic left knee joint [facility])  Past Medical History:  Diagnosis Date  . A-fib (HCC)    PAROXYSMAL  AFIB ON ELIQUIS , MGD BY CARDIO DR [facility]   . Anxiety   . Arthritis   . CHF (congestive heart failure) (HCC)    diastolic Hf  . Dyspnea    at times  . Dysrhythmia    a fib  . Family history of adverse reaction to anesthesia    Daughter hard to wake up  . GERD (gastroesophageal reflux disease)   . Headache    hx of  . History of hiatal hernia   . History of kidney stones   . Hyperlipidemia   . Hypertension   . Hypothyroidism   . Legally blind    per patient ,patient reports she can only see a blur of images;  needs visual guidance for day to day activities ;   . Palpitations   . Pneumonia   . Pulmonary HTN (HCC)    moderate, see ECHO care everywhere ; patient denies any increases repiratory effort today   . Restless leg syndrome   . Retinitis pigmentosa     HPI:    Pt is a 73 y.o. female with extremely complex history involving her left lower extremity. This history is well-documented through our office and as noted her last surgery with me was in July 2018. She had a complex revision surgery with significant bone loss identified in the proximal tibia as well as distal femur. She has not been seen since October 2018 and presents today with a recent onset of of some discomfort on the proximal medial tibia associated with swelling. She otherwise has chronic burning pain. She uses gabapentin to help with that burning pain as well as  meloxicam.  Various options are discussed with the patient. Risks, benefits and expectations were discussed with the patient. Patient understand the risks, benefits and expectations and wishes to proceed with surgery.  PCP: Clemencia Course, PA-C   Discharged Condition: good  Hospital Course:  Patient underwent the above stated procedure on 12/15/2018. Patient tolerated the procedure well and brought to the recovery room in good condition and subsequently to the floor.  POD #1 BP: 113/59 ; Pulse: 72 ; Temp: 98.1 F (36.7 C) ; Resp: 18 Patient reports pain as mild, controlled with medications.  No reported events throughout the night.  She asked and the procedure was described in detail.  We also discussed the plan moving forward. Plan on keep a few days to allow ID to evaluate the lab results and determine to proper treatments moving forward. Dorsiflexion/plantar flexion intact, incision: dressing C/D/I, no cellulitis present and compartment soft.   LABS  Basename    HGB     90.  HCT     31.0   POD #2  BP: 105/50 ; Pulse: 59 ; Temp: 98 F (36.7 C) ; Resp: 16 Patient reports pain as moderate. No events.  Reports having been up with therapy yesterday. Neurovascular intact.  Incision: dressing C/D/I - Left knee.  LABS  Basename  HGB     8.8  HCT     30.2   POD #3  BP: 111/71 ; Pulse: 72 ; Temp: 97.9 F (36.6 C) ; Resp: 20 Patient reports pain as mild.  Had episode of SOB last night but feeling better now.  No other events noted. Neurovascular intact and incision: dressing C/D/I.   LABS  Basename    HGB     8.9  HCT     31.5    Discharge Exam: General appearance: alert, cooperative and no distress Extremities: Homans sign is negative, no sign of DVT, no edema, redness or tenderness in the calves or thighs and no ulcers, gangrene or trophic changes  Disposition:  Home with follow up in 2 weeks   Follow-up Information    Durene Romanslin, Danne Scardina, MD. Schedule an appointment as  soon as possible for a visit in 2 weeks.   Specialty:  Orthopedic Surgery Contact information: [address] [place] [place] [address] [phone]           Discharge Instructions    Call MD / Call 911   Complete by:  As directed    If you experience chest pain or shortness of breath, CALL 911 and be transported to the hospital emergency room.  If you develope a fever above 101 F, pus (white drainage) or increased drainage or redness at the wound, or calf pain, call your surgeon's office.   Constipation Prevention   Complete by:  As directed    Drink plenty of fluids.  Prune juice may be helpful.  You may use a stool softener, such as Colace (over the counter) 100 mg twice a day.  Use MiraLax (over the counter) for constipation as needed.   Diet - low sodium heart healthy   Complete by:  As directed    Home infusion instructions [facility] Dosing [facility]; May administer Cathflo as needed to maintain patency of vascular access device.; Flushing of vascular access device: [facility] Protocol: 0.9% NaCl pre/post medica...   Complete by:  As directed    Instructions:  [facility]osing [facility]   Instructions:  May administer Cathflo as needed to maintain patency of vascular access device.   Instructions:  Flushing of vascular access device: [facility] Protocol: 0.9% NaCl pre/post medication administration and prn patency; Heparin 100 u/ml, 5ml for implanted ports and Heparin 10u/ml, 5ml for all other central venous catheters.   Instructions:  May follow [facility] Anaphylaxis Protocol for First Dose Administration in the home: 0.9% NaCl at 25-50 ml/hr to maintain IV access for protocol meds. Epinephrine 0.3 ml IV/IM PRN and Benadryl 25-50 IV/IM PRN s/s of anaphylaxis.   Instructions:  [facility] Infusion Coordinator (RN) to assist per patient IV care needs in the home PRN.   Increase activity slowly as tolerated   Complete by:  As  directed       Allergies as of 12/18/2018      Reactions   Amiodarone Other (See Comments)   Restless legs   Benadryl [diphenhydramine Hcl] Hives   Metoprolol Other (See Comments)   Restless legs   Septra [sulfamethoxazole-trimethoprim] Hives   Other Rash   Metal      Medication List    TAKE these medications   ALPRAZolam 1 MG tablet Commonly known as:  XANAX Take 1 mg by mouth at bedtime.   atenolol 50 MG tablet Commonly known as:  TENORMIN Take 50 mg by mouth  daily.   cefTRIAXone  IVPB Commonly known as:  ROCEPHIN Inject 2 g into the vein daily. Indication:  PJI Last Day of Therapy:  01/25/2019 Labs - Once weekly:  CBC/D and BMP (on Mondays)   citalopram 40 MG tablet Commonly known as:  CELEXA Take 40 mg by mouth every evening.   docusate sodium 100 MG capsule Commonly known as:  Colace Take 1 capsule (100 mg total) by mouth 2 (two) times daily.   Eliquis 5 MG Tabs tablet Generic drug:  apixaban Take 5 mg by mouth 2 (two) times daily.   ferrous sulfate 325 (65 FE) MG tablet Commonly known as:  FerrouSul Take 1 tablet (325 mg total) by mouth 3 (three) times daily with meals.   furosemide 40 MG tablet Commonly known as:  LASIX Take 40 mg by mouth daily.   gabapentin 600 MG tablet Commonly known as:  NEURONTIN Take 1,200 mg by mouth at bedtime.   HYDROcodone-acetaminophen 7.5-325 MG tablet Commonly known as:  Norco Take 1-2 tablets by mouth every 4 (four) hours as needed for moderate pain.   levothyroxine 112 MCG tablet Commonly known as:  SYNTHROID Take 112 mcg by mouth every evening.   Melatonin 10 MG Tabs Take 20 mg by mouth at bedtime.   methocarbamol 500 MG tablet Commonly known as:  Robaxin Take 1 tablet (500 mg total) by mouth every 6 (six) hours as needed for muscle spasms.   pantoprazole 40 MG tablet Commonly known as:  PROTONIX Take 40 mg by mouth daily.   polyethylene glycol 17 g packet Commonly known as:  MIRALAX / GLYCOLAX Take  17 g by mouth 2 (two) times daily.   potassium chloride 10 MEQ tablet Commonly known as:  K-DUR Take 10 mEq by mouth daily as needed (poatassium).   rOPINIRole 2 MG tablet Commonly known as:  REQUIP Take 2 mg by mouth 4 (four) times daily.   vancomycin  IVPB Inject 750 mg into the vein daily. Indication:  PJI Last Day of Therapy:  01/25/2019 Labs - Monday:  CBC/D, BMP, and vancomycin trough.            Home Infusion Instuctions  (From admission, onward)         Start     Ordered   12/18/18 0000  Home infusion instructions [facility] May follow [facility] Dosing Protocol; May administer Cathflo as needed to maintain patency of vascular access device.; Flushing of vascular access device: per [facility] Protocol: 0.9% NaCl pre/post medica...    Question Answer Comment  Instructions May follow [facility] Dosing Protocol   Instructions May administer Cathflo as needed to maintain patency of vascular access device.   Instructions Flushing of vascular access device: per [facility] Protocol: 0.9% NaCl pre/post medication administration and prn patency; Heparin 100 u/ml, 66ml for implanted ports and Heparin 10u/ml, 17ml for all other central venous catheters.   Instructions May follow [facility] Anaphylaxis Protocol for First Dose Administration in the home: 0.9% NaCl at 25-50 ml/hr to maintain IV access for protocol meds. Epinephrine 0.3 ml IV/IM PRN and Benadryl 25-50 IV/IM PRN s/s of anaphylaxis.   Instructions [facility] Infusion Coordinator (RN) to assist per patient IV care needs in the home PRN.      12/18/18 5916           Signed: Anastasio Auerbach. Justinn Welter   PA-C  12/21/2018, 11:48 AM

## 2018-12-26 ENCOUNTER — Other Ambulatory Visit: Payer: Self-pay

## 2018-12-26 ENCOUNTER — Emergency Department (HOSPITAL_COMMUNITY)
Admission: EM | Admit: 2018-12-26 | Discharge: 2018-12-26 | Disposition: A | Discharge status: Home / Self Care | Payer: Medicare Other | Attending: Emergency Medicine | Admitting: Emergency Medicine

## 2018-12-26 ENCOUNTER — Emergency Department (HOSPITAL_COMMUNITY): Payer: Medicare Other

## 2018-12-26 ENCOUNTER — Encounter (HOSPITAL_COMMUNITY): Payer: Self-pay | Admitting: *Deleted

## 2018-12-26 DIAGNOSIS — E039 Hypothyroidism, unspecified: Secondary | ICD-10-CM | POA: Diagnosis not present

## 2018-12-26 DIAGNOSIS — Z79899 Other long term (current) drug therapy: Secondary | ICD-10-CM | POA: Insufficient documentation

## 2018-12-26 DIAGNOSIS — Z7901 Long term (current) use of anticoagulants: Secondary | ICD-10-CM | POA: Insufficient documentation

## 2018-12-26 DIAGNOSIS — Z96652 Presence of left artificial knee joint: Secondary | ICD-10-CM | POA: Insufficient documentation

## 2018-12-26 DIAGNOSIS — I5030 Unspecified diastolic (congestive) heart failure: Secondary | ICD-10-CM | POA: Insufficient documentation

## 2018-12-26 DIAGNOSIS — Z87891 Personal history of nicotine dependence: Secondary | ICD-10-CM | POA: Insufficient documentation

## 2018-12-26 DIAGNOSIS — Z95828 Presence of other vascular implants and grafts: Secondary | ICD-10-CM

## 2018-12-26 DIAGNOSIS — R2231 Localized swelling, mass and lump, right upper limb: Secondary | ICD-10-CM | POA: Diagnosis present

## 2018-12-26 DIAGNOSIS — I110 Hypertensive heart disease with heart failure: Secondary | ICD-10-CM | POA: Insufficient documentation

## 2018-12-26 NOTE — ED Provider Notes (Addendum)
[facility]-EMERGENCY DEPT Provider Note   CSN: [id number] Arrival date & time: 12/26/18  1519    History   Chief Complaint Chief Complaint  Patient presents with  . PICC Line problems    HPI [name] is a 73 y.o. female.     HPI   73 year old female presenting for evaluation of her right upper extremity PICC line.  She is getting antibiotics through it for a left knee infection.  She is getting injections by her daughter.  Today she was getting antibiotics when she noticed some discomfort around her right elbow and thinks that also may be leaking.  She states that her PICC line seem to be functioning fine just prior to this.  No other acute complaints.  Her knee is still hurting but feels like it is slowly progressing.  Past Medical History:  Diagnosis Date  . A-fib (HCC)    PAROXYSMAL  AFIB ON ELIQUIS , [facility]   . Anxiety   . Arthritis   . CHF (congestive heart failure) (HCC)    diastolic Hf  . Dyspnea    at times  . Dysrhythmia    a fib  . Family history of adverse reaction to anesthesia    Daughter hard to wake up  . GERD (gastroesophageal reflux disease)   . Headache    hx of  . History of hiatal hernia   . History of kidney stones   . Hyperlipidemia   . Hypertension   . Hypothyroidism   . Legally blind    per patient ,patient reports she can only see a blur of images;  needs visual guidance for day to day activities ;   . Palpitations   . Pneumonia   . Pulmonary HTN (HCC)    moderate, see ECHO care everywhere ; patient denies any increases repiratory effort today   . Restless leg syndrome   . Retinitis pigmentosa     Patient Active Problem List   Diagnosis Date Noted  . 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 Obese 03/19/2017  . 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 Left TKA resection, spacer 11/11/2016    Past Surgical History:  Procedure Laterality Date  .  EXCISIONAL TOTAL KNEE ARTHROPLASTY WITH ANTIBIOTIC SPACERS Left 11/11/2016   Procedure: Left total knee arthroplasty resection and placement of antibiotic spacer;  Surgeon: Durene RomansMatthew Olin, MD;  Location: [facility];  Service: Orthopedics;  Laterality: Left;  Adductor Block  . EYE SURGERY     cataract extraction  . I&D KNEE WITH POLY EXCHANGE Left 12/15/2018   Procedure: IRRIGATION AND DEBRIDEMENT left total knee with poly exchange;  Surgeon: Durene Romanslin, Matthew, MD;  Location: [facility];  Service: Orthopedics;  Laterality: Left;  90min  . KNEE ARTHROPLASTY Left 08/1997   [place]  . knee arthroplasty Left    x4 revision d/t infection and failed initial arthroplasty  . NECK SURGERY  2016  . ORIF WRIST FRACTURE  09/2018   METAL IN PLACE   . Reimplantatio of Left total knee     03/17/17 Dr. Charlann Boxerlin  . REIMPLANTATION OF TOTAL KNEE Left 03/17/2017   Procedure: REIMPLANTATION OF LEFT TOTAL KNEE Arthroplasty;  Surgeon: Durene Romanslin, Matthew, MD;  Location: [facility];  Service: Orthopedics;  Laterality: Left;     OB History    Gravida  1   Para      Term  Preterm      AB      Living        SAB      TAB      Ectopic      Multiple      Live Births               Home Medications    Prior to Admission medications   Medication Sig Start Date End Date Taking? Authorizing Provider  ALPRAZolam Prudy Feeler) 1 MG tablet Take 1 mg by mouth at bedtime.    [provider]  apixaban (ELIQUIS) 5 MG TABS tablet Take 5 mg by mouth 2 (two) times daily.    [provider]  atenolol (TENORMIN) 50 MG tablet Take 50 mg by mouth daily.    [provider]  cefTRIAXone (ROCEPHIN) IVPB Inject 2 g into the vein daily. Indication:  PJI Last Day of Therapy:  01/25/2019 Labs - Once weekly:  CBC/D and BMP (on Mondays) 12/18/18 01/26/19  Durene Romans, MD  citalopram (CELEXA) 40 MG tablet Take 40 mg by mouth every evening.    [provider]  docusate sodium (COLACE) 100 MG  capsule Take 1 capsule (100 mg total) by mouth 2 (two) times daily. 12/17/18   Lanney Gins, PA-C  ferrous sulfate (FERROUSUL) 325 (65 FE) MG tablet Take 1 tablet (325 mg total) by mouth 3 (three) times daily with meals. 12/17/18   Lanney Gins, PA-C  furosemide (LASIX) 40 MG tablet Take 40 mg by mouth daily.     [provider]  gabapentin (NEURONTIN) 600 MG tablet Take 1,200 mg by mouth at bedtime.     [provider]  HYDROcodone-acetaminophen (NORCO) 7.5-325 MG tablet Take 1-2 tablets by mouth every 4 (four) hours as needed for moderate pain. 12/17/18   Lanney Gins, PA-C  levothyroxine (SYNTHROID, LEVOTHROID) 112 MCG tablet Take 112 mcg by mouth every evening.     [provider]  Melatonin 10 MG TABS Take 20 mg by mouth at bedtime.    [provider]  methocarbamol (ROBAXIN) 500 MG tablet Take 1 tablet (500 mg total) by mouth every 6 (six) hours as needed for muscle spasms. 12/17/18   Lanney Gins, PA-C  pantoprazole (PROTONIX) 40 MG tablet Take 40 mg by mouth daily.    [provider]  polyethylene glycol (MIRALAX / GLYCOLAX) 17 g packet Take 17 g by mouth 2 (two) times daily. 12/17/18   Lanney Gins, PA-C  potassium chloride (K-DUR,KLOR-CON) 10 MEQ tablet Take 10 mEq by mouth daily as needed (poatassium).     [provider]  rOPINIRole (REQUIP) 2 MG tablet Take 2 mg by mouth 4 (four) times daily.     [provider]  vancomycin IVPB Inject 750 mg into the vein daily. Indication:  PJI Last Day of Therapy:  01/25/2019 Labs - Monday:  CBC/D, BMP, and vancomycin trough. 12/18/18 01/26/19  Durene Romans, MD    Family History No family history on file.  Social History Social History   Tobacco Use  . Smoking status: Former Smoker    Years: 28.00    Types: Cigarettes  . Smokeless tobacco: Never Used  . Tobacco comment: quit 30 years ago  Substance Use Topics  . Alcohol use: No  . Drug use: No     Allergies    Amiodarone; Benadryl [diphenhydramine hcl]; Metoprolol; Septra [sulfamethoxazole-trimethoprim]; and Other   Review of Systems Review of Systems  All systems reviewed and negative, other than as  noted in HPI.  Physical Exam Updated Vital Signs BP 129/70 (BP Location: Left Arm)   Pulse 84   Temp 98.4 F (36.9 C) (Oral)   Resp 18   Ht  (1.575 m)   Wt 79.4 kg   SpO2 94%   BMI 32.01 kg/m   Physical Exam Vitals signs and nursing note reviewed.  Constitutional:      General: She is not in acute distress.    Appearance: She is well-developed.  HENT:     Head: Normocephalic and atraumatic.  Eyes:     General:        Right eye: No discharge.        Left eye: No discharge.     Conjunctiva/sclera: Conjunctivae normal.  Neck:     Musculoskeletal: Neck supple.  Cardiovascular:     Rate and Rhythm: Normal rate and regular rhythm.     Heart sounds: Normal heart sounds. No murmur. No friction rub. No gallop.      Comments: Client to right upper extremity.  Insertion site looks good.  Dressings clean and intact.  No concerning surrounding skin changes.  Patient points near her right antecubital fossa towards the medial aspect of the elbow as the area having some mild swelling.  I cannot appreciate this on exam though.  She is able to freely move the elbow without apparent discomfort.  She is neurovascular intact distally. Pulmonary:     Effort: Pulmonary effort is normal. No respiratory distress.     Breath sounds: Normal breath sounds.  Abdominal:     General: There is no distension.     Palpations: Abdomen is soft.     Tenderness: There is no abdominal tenderness.  Musculoskeletal:     Comments: Knee with bandage in place.  There is some mild swelling and erythema.  Dressing is dry/intact.  Skin:    General: Skin is warm and dry.     Findings: Erythema present.  Neurological:     Mental Status: She is alert.  Psychiatric:        Behavior: Behavior normal.        Thought  Content: Thought content normal.      ED Treatments / Results  Labs (all labs ordered are listed, but only abnormal results are displayed) Labs Reviewed - No data to display  EKG None  Radiology Dg Chest 1 View  Result Date: 12/26/2018 CLINICAL DATA:  PICC placement EXAM: CHEST  1 VIEW COMPARISON:  12/18/2018 FINDINGS: Unchanged AP portable examination with cardiomegaly and minimal diffuse interstitial pulmonary opacity, possibly chronic interstitial change. No new or focal airspace opacity. No change in position or appearance of right upper extremity PICC, tip positioned near the superior cavoatrial junction. IMPRESSION: Unchanged AP portable examination with cardiomegaly and minimal diffuse interstitial pulmonary opacity, possibly chronic interstitial change. No new or focal airspace opacity. No change in position or appearance of right upper extremity PICC, tip positioned near the superior cavoatrial junction. Electronically Signed   By: Lauralyn Primes M.D.   On: 12/26/2018 18:18    Procedures Procedures (including critical care time)  Medications Ordered in ED Medications - No data to display   Initial Impression / Assessment and Plan / ED Course  I have reviewed the triage vital signs and the nursing notes.  Pertinent labs & imaging results that were available during my care of the patient were reviewed by me and considered in my medical decision making (see chart for details).  72yF with c/o swelling around R elbow. I can't really appreciate this on my exam. It was flushed here in the ED and appears to be working fine.  Advised to continue to observe and return precautions discussed.   7:16 PM I spoke with patient's daughters. They are extremely upset that patient is being discharged. They are concerned that the PICC line is leaking. We were not able to reproduce this in the ED. Flushed by nursing and patient didn't report symptoms. Flushed easily and blood return. CXR  shows tip is essentially unchanged in posiiton. I tried to reassure to no avail. Advised for them to continue to observe and have her re-evaluated if symptoms persist. They loudly let me know they would as well be filing a lawsuit.   "Irrigated PICC per order for evaluation. PICC flushes easily with no resistance. Blood return noted. Dressing clean, dry and intact. Antimicrobial disc intact and clean. No edema or erythema noted to site. Dr. Juleen China notified that line is patent."   Final Clinical Impressions(s) / ED Diagnoses   Final diagnoses:  Status post peripherally inserted central catheter (PICC) central line placement    ED Discharge Orders    None       Raeford Razor, MD 12/26/18 1727    Raeford Razor, MD 12/26/18 [phone]

## 2018-12-26 NOTE — ED Notes (Addendum)
Patient verbalized permission for RN to sign for discharge due to being legally blind and unable to see topaz. Patient A&O x4 and stable upon discharge. Patient verbalized that she was upset about how long she had been here and that "she has been here for no reason at all." When going over discharge paperwork, instructed patient to return if problem continued and patient stated "I will not be coming back here if there is an issue." Provided patient counseling and an opportunity for questions and concerns.

## 2018-12-26 NOTE — ED Notes (Addendum)
Irrigated PICC per order for evaluation. PICC flushes easily with no resistance. Blood return noted. Dressing clean, dry and intact. Antimicrobial disc intact and clean. No edema or erythema noted to site. Dr. Juleen China notified that line is patent.

## 2018-12-26 NOTE — Discharge Instructions (Addendum)
Your PICC line appears to be functioning normally. I want you to keep using it as directed. With where the line is inserted and runs, I would not expect a leakage from it to give you symptoms down where you are having them. I want you to continue to keep an eye on your symptoms and be re-evaluated if symptoms worsen or you develop new ones.

## 2018-12-26 NOTE — ED Triage Notes (Signed)
PICC line rt arm started to swell prior to flushing line after meds, stings and burns.

## 2018-12-28 ENCOUNTER — Encounter: Payer: Self-pay | Admitting: Internal Medicine

## 2019-01-07 ENCOUNTER — Other Ambulatory Visit: Payer: Self-pay

## 2019-01-07 ENCOUNTER — Ambulatory Visit (INDEPENDENT_AMBULATORY_CARE_PROVIDER_SITE_OTHER): Payer: Medicare Other | Admitting: Internal Medicine

## 2019-01-07 ENCOUNTER — Encounter: Payer: Self-pay | Admitting: Internal Medicine

## 2019-01-07 DIAGNOSIS — T8454XD Infection and inflammatory reaction due to internal left knee prosthesis, subsequent encounter: Secondary | ICD-10-CM

## 2019-01-07 DIAGNOSIS — Z5181 Encounter for therapeutic drug level monitoring: Secondary | ICD-10-CM

## 2019-01-07 DIAGNOSIS — Z452 Encounter for adjustment and management of vascular access device: Secondary | ICD-10-CM | POA: Insufficient documentation

## 2019-01-07 MED ORDER — DOXYCYCLINE HYCLATE 100 MG PO CAPS: 100.0000 mg | ORAL_CAPSULE | Freq: Two times a day (BID) | ORAL | 5 refills | Status: DC

## 2019-01-07 MED ORDER — CEPHALEXIN 500 MG PO CAPS: 500.0000 mg | ORAL_CAPSULE | Freq: Four times a day (QID) | ORAL | 5 refills | Status: DC

## 2019-01-07 NOTE — Assessment & Plan Note (Signed)
No issues per report.

## 2019-01-07 NOTE — Progress Notes (Signed)
   Subjective:    Patient ID: [name], female    DOB: 1945/10/02, 73 y.o.   MRN: [id number]  HPI I connected with [name] on 01/07/2019 . I verified that I was speaking with the correct person using two identifiers. Due to the COVID-19 Pandemic, this service was provided via telemedicine using audio/visual media.   The patient was located at home. The provider was located in the office. The patient did consent to this visit and is aware of charges through their insurance as well as the limitations of evaluation and management by telemedicine. Other persons participating in this telemedicine service were none. 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  She has had some loose stools, about 2-3 per day but no associated weight loss, no abdominal pain or bloody stools.  No other complaints.  Knee feels well, some occasional swelling but no increase in pain.  No fever or chills.    Review of Systems  Constitutional: Negative for chills, fatigue and fever.  Skin: Negative for rash.       Objective:   Physical Exam        Assessment & Plan:

## 2019-01-07 NOTE — Assessment & Plan Note (Signed)
Labs without concerns.  Will continue to follow.

## 2019-01-07 NOTE — Assessment & Plan Note (Signed)
Culture remained negative but she is doing well and seems improved on empiric vancomycin and ceftriaxone.  I plan to have her complete this on June 8 which will be 6 weeks and I will send in oral continuation therapy with doxycycline and Keflex.  I plan on oral therapy for 6 months and then convert to suppressive antibiotics such as keflex twice a day after that.

## 2019-01-07 NOTE — Progress Notes (Signed)
Patient states she has episodes of diarrhea shortly after medication infusion up ot 2 -3 times a day. LPN will made MD aware of patient concerns.  Valarie Cones, LPN

## 2019-01-07 NOTE — Progress Notes (Signed)
Called [facility], spoke with Eunice Blase and gave orders to Pull Picc line after last dose (01/25/19). VORB and understood. Patient made aware during phone visit with MD. Valarie Cones, LPN

## 2019-01-13 ENCOUNTER — Telehealth: Payer: Self-pay

## 2019-01-13 NOTE — Telephone Encounter (Signed)
When I spoke with the patient last week, it was 2-3 times per day.  Is it much more now?  Loosing weight?

## 2019-01-13 NOTE — Telephone Encounter (Signed)
Daughter called office back states patient is still at the hospital completing workup. States SOB has resolved. Diarrhea continues. Hospital was not able to collect enough sample for stool collection. Daughter is concerned weatear to change antibiotic due to episodes of diarrhea. Sending  to provider for advise.

## 2019-01-13 NOTE — Telephone Encounter (Signed)
Daughter states diarrhea is 3-4 times a day.  States no weight loss noted. State she missed her dose today due to being in ER (currently being discharged now)

## 2019-01-13 NOTE — Telephone Encounter (Signed)
Received call in triage from patient's daughter Misty Stanley. Reports patient having diarrhea (starting a couple of weeks ago), wheezing today, and having shortness of breath, sleeping most of day.  LPN advised daughter to take patient to nearest ED to be evaluated due to recent change in patient's respiratory orientation via EMS. Daughter states she will transport patient to [facility] center by personal vehicle  for evaluation. Routing message to MD to made aware.  Valarie Cones, LPN

## 2019-01-14 ENCOUNTER — Telehealth: Payer: Self-pay | Admitting: *Deleted

## 2019-01-14 NOTE — Telephone Encounter (Signed)
RN spoke with Dr Linus Salmons, received verbal orders to relay to Amy at [place].  Per Dr Linus Salmons, patient will stop Ceftriaxone today, but continue with IV Vancomycin until the original stop date of 6/8, with home health pulling PICC at completion.  She will start keflex 500 mg, 1 tablet by mouth 4 times daily with her vancomycin today.  When her vancomycin is complete, she will continue oral keflex as prescribed and add oral doxycycline 100 mg, 1 tablet by mouth twice daily.  These oral antibiotics will be long term per Dr Linus Salmons.   Yesterday, patient was given oral vancomycin 125 mg, take 1 capsule by mouth 4 times daily x 10 days at her ER visit for presumed c diff.  OK per Dr Linus Salmons for her to start this today with IV vancomycin and oral keflex.  Patient has home health coordinated by [facility]. RN gave verbal order per Dr Linus Salmons for c diff stool test (patient has kit provided by ER yesterday). Orders repeated and verified.  Will send mychart message with instructions for patient and her daughter. Landis Gandy, RN

## 2019-01-14 NOTE — Telephone Encounter (Addendum)
Daughter calling with update. Her mother was given 1/2 bag of fluids at [facility] ER and discharge home (notes/labs available in [place]).  ED gave her a collection kit to bring a stool sample to her PCP for testing and a prescription for presumed c diff (125 mg 4 times daily for 10 days). She has not yet started the oral vancomycin.  She has not yet restarted IV antibiotics.  Next dose due at lunch time. She is eating and drinking well, still having some diarrhea (woke up with moderate amount of diarrhea in her disposable brief). Incontinence of stool is new since starting IV antibiotics. Please advise. Landis Gandy, RN

## 2019-01-18 ENCOUNTER — Other Ambulatory Visit: Payer: Self-pay | Admitting: Internal Medicine

## 2019-01-18 ENCOUNTER — Telehealth: Payer: Self-pay

## 2019-01-18 MED ORDER — CEPHALEXIN 500 MG PO CAPS: 500.0000 mg | ORAL_CAPSULE | Freq: Four times a day (QID) | ORAL | 5 refills | Status: DC

## 2019-01-18 MED ORDER — LOPERAMIDE HCL 2 MG PO CAPS: 2.0000 mg | ORAL_CAPSULE | ORAL | 1 refills | Status: DC | PRN

## 2019-01-18 MED ORDER — DOXYCYCLINE HYCLATE 100 MG PO CAPS: 100.0000 mg | ORAL_CAPSULE | Freq: Two times a day (BID) | ORAL | 5 refills | Status: DC

## 2019-01-18 NOTE — Telephone Encounter (Signed)
Oh good.  She can stop the oral vancomycin.  Continue with IV vancomycin (I think to stop soon and take doxy in its place as prescribed at stop date) Continue with Keflex Marcelino Duster is aware too  Rob

## 2019-01-18 NOTE — Telephone Encounter (Signed)
Ok.  Stop the vancomycin IV and start doxycycline 100 mg twice a day.  Pull picc line She was prescribed Keflex to replace the ceftriaxone already so should have been on it, 500 mg qid I will send in imodium thanks

## 2019-01-18 NOTE — Telephone Encounter (Signed)
Patient and her daughter are calling to report a negative c-diff lab result.  They would like to know which IV medications to continue or discontinue.   Laurell Josephs, RN

## 2019-01-18 NOTE — Telephone Encounter (Signed)
Spoke with patient's daughter since patient did not seem to understand directions.  Informed Misty Stanley, PICC can be removed and order sent to home health agency. Start doxycycline and keflex as directed .   Laurell Josephs, RN

## 2019-01-18 NOTE — Telephone Encounter (Signed)
Pt states she has discontinued oral vancomycin and only taking IV vancomycin. Her loose stools continue and is so bad she is staining the bed throughout the night.  She would like to d/c IV vancomycin .  January 25, 2019 is her stop date per patient.

## 2019-01-18 NOTE — Telephone Encounter (Signed)
[facility] was informed patient's PICC can be removed per Dr Luciana Axe.   Laurell Josephs, RN

## 2019-01-21 ENCOUNTER — Telehealth: Payer: Self-pay

## 2019-01-21 NOTE — Telephone Encounter (Signed)
Received call from Clydie Braun with [facility]. Clydie Braun wanted to clarify orders to pull picc line. Orders confirmed as of 01/18/19 per Dr. Luciana Axe to d/c vanc and pull picc line and continue keflex and doxy. HH understood orders to pull picc line.  Valarie Cones, LPN

## 2019-02-10 ENCOUNTER — Ambulatory Visit: Payer: Medicare Other | Admitting: Internal Medicine

## 2019-05-07 DIAGNOSIS — G2401 Drug induced subacute dyskinesia: Secondary | ICD-10-CM | POA: Diagnosis present

## 2019-07-29 IMAGING — DX CHEST  1 VIEW
1 series · 1 of 1 positions shown · non-contrast
Comparison: 12/18/2018

CLINICAL DATA: PICC placement

EXAM:
CHEST  1 VIEW

[chest ap]
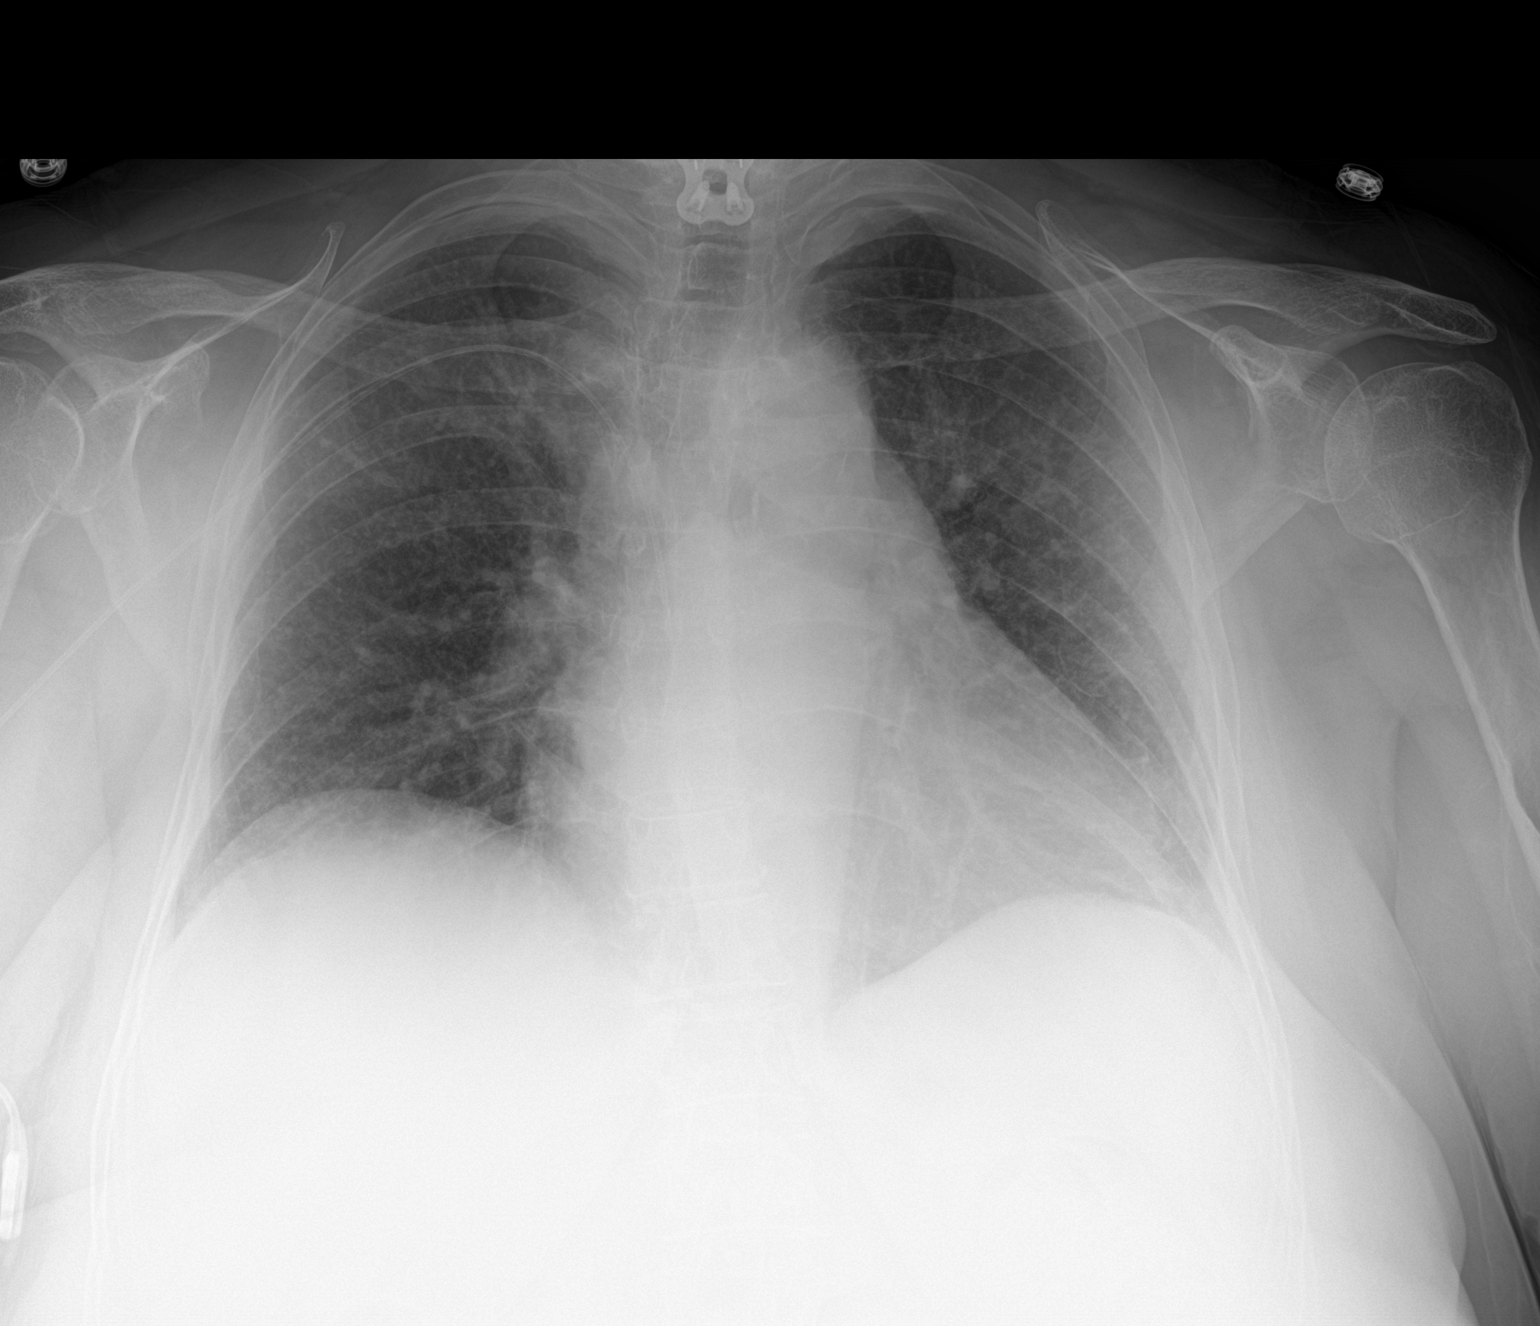

[1 of 1 positions shown; findings below may reference images not displayed]

FINDINGS: Unchanged AP portable examination with cardiomegaly and minimal
diffuse interstitial pulmonary opacity, possibly chronic
interstitial change. No new or focal airspace opacity. No change in
position or appearance of right upper extremity PICC, tip positioned
near the superior cavoatrial junction.
IMPRESSION: Unchanged AP portable examination with cardiomegaly and minimal
diffuse interstitial pulmonary opacity, possibly chronic
interstitial change. No new or focal airspace opacity. No change in
position or appearance of right upper extremity PICC, tip positioned
near the superior cavoatrial junction.

## 2019-09-17 ENCOUNTER — Ambulatory Visit: Payer: Medicare Other

## 2019-09-19 ENCOUNTER — Ambulatory Visit: Payer: Medicare Other

## 2019-09-23 ENCOUNTER — Ambulatory Visit: Payer: Medicare Other

## 2019-09-28 ENCOUNTER — Ambulatory Visit: Payer: Medicare Other

## 2019-09-30 ENCOUNTER — Ambulatory Visit: Payer: Medicare Other

## 2019-11-29 ENCOUNTER — Other Ambulatory Visit: Payer: Self-pay | Admitting: Internal Medicine

## 2019-11-30 ENCOUNTER — Telehealth: Payer: Self-pay

## 2019-11-30 ENCOUNTER — Other Ambulatory Visit: Payer: Self-pay

## 2019-11-30 MED ORDER — CEPHALEXIN 500 MG PO CAPS: 500.0000 mg | ORAL_CAPSULE | Freq: Four times a day (QID) | ORAL | 5 refills | Status: DC

## 2019-11-30 NOTE — Telephone Encounter (Signed)
She was going to stay on Keflex 500 mg twice a day for life I believe.  Refill that for 6 months then have her follow up thanks

## 2019-11-30 NOTE — Telephone Encounter (Signed)
Received refill request for Doxycyline from pharmacy. Patient has not followed up with office since 12/2018. Will forward request to MD to advise if refill is okay.  Lorenso Courier, [place]

## 2019-11-30 NOTE — Telephone Encounter (Signed)
Called patient to schedule 6 month f/u. Patient states she will call office back regarding appointment [name], CMA

## 2019-12-05 ENCOUNTER — Other Ambulatory Visit: Payer: Self-pay | Admitting: Internal Medicine

## 2019-12-06 ENCOUNTER — Other Ambulatory Visit: Payer: Self-pay

## 2019-12-06 ENCOUNTER — Telehealth: Payer: Self-pay | Admitting: Pharmacy Technician

## 2019-12-06 NOTE — Telephone Encounter (Signed)
[facility] Patient Advocate Encounter  Patient's daughter was contacted by [facility] Hawks in [place] that the antibiotic called in on 04/13 was previously discontinued. She is under the impression Doxycycline should have been sent in instead of Cephalexin. She is currently out of medication and asking that new refills be sent to [facility] for the correct antibiotic.

## 2019-12-24 ENCOUNTER — Telehealth: Payer: Self-pay

## 2019-12-24 NOTE — Telephone Encounter (Signed)
COVID-19 Pre-Screening Questions:12/24/19  Do you currently have a fever (>100 F), chills or unexplained body aches? NO   Are you currently experiencing new cough, shortness of breath, sore throat, runny nose?NO  .  Have you recently travelled outside the state of [place] in the last 14 days?NO .  Have you been in contact with someone that is currently pending confirmation of Covid19 testing or has been confirmed to have the Covid19 virus?  NO   **If the patient answers NO to ALL questions -  advise the patient to please call the clinic before coming to the office should any symptoms develop.   

## 2019-12-26 DIAGNOSIS — F22 Delusional disorders: Secondary | ICD-10-CM | POA: Insufficient documentation

## 2019-12-26 DIAGNOSIS — F411 Generalized anxiety disorder: Secondary | ICD-10-CM | POA: Insufficient documentation

## 2019-12-26 DIAGNOSIS — F329 Major depressive disorder, single episode, unspecified: Secondary | ICD-10-CM | POA: Insufficient documentation

## 2019-12-26 DIAGNOSIS — R443 Hallucinations, unspecified: Secondary | ICD-10-CM | POA: Diagnosis present

## 2019-12-27 ENCOUNTER — Ambulatory Visit: Payer: Medicare Other | Admitting: Internal Medicine

## 2019-12-27 MED ORDER — ROPINIROLE HCL 2 MG PO TABS
2.00 | ORAL_TABLET | ORAL | Status: DC
Start: 2019-12-27 — End: 2019-12-27

## 2019-12-27 MED ORDER — DULOXETINE HCL 30 MG PO CPEP
60.00 | ORAL_CAPSULE | ORAL | Status: DC
Start: 2019-12-28 — End: 2019-12-27

## 2019-12-27 MED ORDER — QUETIAPINE FUMARATE 25 MG PO TABS
12.50 | ORAL_TABLET | ORAL | Status: DC
Start: 2019-12-27 — End: 2019-12-27

## 2019-12-27 MED ORDER — ALPRAZOLAM 0.5 MG PO TABS
0.50 | ORAL_TABLET | ORAL | Status: DC
Start: 2019-12-28 — End: 2019-12-27

## 2019-12-27 MED ORDER — CARBIDOPA-LEVODOPA 25-100 MG PO TABS
1.00 | ORAL_TABLET | ORAL | Status: DC
Start: 2019-12-28 — End: 2019-12-27

## 2019-12-27 MED ORDER — PANTOPRAZOLE SODIUM 40 MG PO TBEC
40.00 | DELAYED_RELEASE_TABLET | ORAL | Status: DC
Start: 2019-12-28 — End: 2019-12-27

## 2019-12-27 MED ORDER — CARBIDOPA-LEVODOPA 25-100 MG PO TABS
0.50 | ORAL_TABLET | ORAL | Status: DC
Start: 2019-12-27 — End: 2019-12-27

## 2019-12-27 MED ORDER — APIXABAN 5 MG PO TABS
5.00 | ORAL_TABLET | ORAL | Status: DC
Start: 2019-12-27 — End: 2019-12-27

## 2019-12-27 MED ORDER — DOXYCYCLINE HYCLATE 100 MG PO TABS
100.00 | ORAL_TABLET | ORAL | Status: DC
Start: 2019-12-27 — End: 2019-12-27

## 2019-12-27 MED ORDER — LEVOTHYROXINE SODIUM 112 MCG PO TABS
112.00 | ORAL_TABLET | ORAL | Status: DC
Start: 2019-12-28 — End: 2019-12-27

## 2019-12-27 MED ORDER — GABAPENTIN 300 MG PO CAPS
600.00 | ORAL_CAPSULE | ORAL | Status: DC
Start: 2019-12-27 — End: 2019-12-27

## 2019-12-30 ENCOUNTER — Other Ambulatory Visit: Payer: Self-pay

## 2019-12-30 NOTE — Telephone Encounter (Signed)
Patient's daughter called office today requesting appointment for her mother. States she missed last one due to being in the hospital. Patient is not able to do morning appointments and is requesting afternoon times. Next appointment is not until 6/16. Patient agreed to appointment, but would like to know if she could get refills on doxy. Her last dose is 5/19. Patient is not interested in doing a virtual visit. Will forward message to MD to advise on refill Lorenso Courier, CMA

## 2019-12-31 NOTE — Telephone Encounter (Signed)
At this point, she should just take the keflex twice a day.  Ok to refill

## 2019-12-31 NOTE — Telephone Encounter (Signed)
Called patient's husband regarding refill request. Informed him that Keflex was sent into pharmacy for patient to take. Can stop doxy at this time.  Patient's husband verbalized understanding. States that patient is at [facility] being evaluated for hallucinations. Lorenso Courier, [place]

## 2020-01-01 DIAGNOSIS — K219 Gastro-esophageal reflux disease without esophagitis: Secondary | ICD-10-CM | POA: Insufficient documentation

## 2020-01-24 ENCOUNTER — Other Ambulatory Visit: Payer: Self-pay

## 2020-01-24 MED ORDER — DOXYCYCLINE HYCLATE 100 MG PO CAPS: 100.0000 mg | ORAL_CAPSULE | Freq: Two times a day (BID) | ORAL | 0 refills | Status: DC

## 2020-01-26 ENCOUNTER — Telehealth: Payer: Self-pay

## 2020-01-26 NOTE — Telephone Encounter (Signed)
Patient left VM requesting an appointment and refills; patient due for follow up with Dr. Luciana Axe. Left vm requesting a call back to get scheduled and clarify what medication needs refills.   Dilynn Munroe [place] Mast, RN

## 2020-02-02 ENCOUNTER — Ambulatory Visit: Payer: Medicare Other | Admitting: Internal Medicine

## 2020-02-10 ENCOUNTER — Ambulatory Visit (INDEPENDENT_AMBULATORY_CARE_PROVIDER_SITE_OTHER): Payer: Medicare Other | Admitting: Internal Medicine

## 2020-02-10 ENCOUNTER — Encounter: Payer: Self-pay | Admitting: Internal Medicine

## 2020-02-10 ENCOUNTER — Other Ambulatory Visit: Payer: Self-pay

## 2020-02-10 VITALS — BP 109/70 | HR 86

## 2020-02-10 DIAGNOSIS — R21 Rash and other nonspecific skin eruption: Secondary | ICD-10-CM

## 2020-02-10 DIAGNOSIS — Z5181 Encounter for therapeutic drug level monitoring: Secondary | ICD-10-CM | POA: Diagnosis not present

## 2020-02-10 DIAGNOSIS — T8454XD Infection and inflammatory reaction due to internal left knee prosthesis, subsequent encounter: Secondary | ICD-10-CM

## 2020-02-10 NOTE — Progress Notes (Signed)
° °  Subjective:    Patient ID: [name], female    DOB: February 24, 1946, 74 y.o.   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  This was one of multiple previous surgeries and is an attempt at limb salvage.  She previously had seen Dr. Charlann Boxer in 2018 and had done a 2 stage revision and treated with cefazolin and then reimplanted.  She returned to him recently and underwent the above precedure with pain and swelling in the knee.  She completed 6 weeks of IV vancomycin though stopped the ceftriaxone a little early due to diarrhea and was switched to oral Keflex. After the 6 weeks, she was converted to oral doxycyclineand Keflex for a presumed 6 months continuation therapy followed by Keflex bid for likely lifetime suppression. She though stopped the doxycycline about 2 weeks ago due to a rash on her arm and forehead.  She noted a burning at the site of the rash and itch.  She also has only been taking the Keflex twice a day though her husband confirms the bottle is 4 times per day.  She says she was not aware.  Her knee has some persistent swelling.   She has had some diarrhea as well, about 3-4 loose stools per day again recently.    Review of Systems  Constitutional: Negative for chills and fever.  Gastrointestinal: Positive for diarrhea. Negative for nausea.  Skin: Positive for rash.       Objective:   Physical Exam Constitutional:      Appearance: Normal appearance.  Eyes:     General: No scleral icterus. Cardiovascular:     Rate and Rhythm: Normal rate.  Pulmonary:     Effort: Pulmonary effort is normal.  Skin:    Comments: Resolved rash over forehead Right arm with erythematous patchy area with scratch marks  Neurological:     Mental Status: She is alert.  Psychiatric:        Mood and Affect: Mood normal.    Sh: no current tobacco use       Assessment & Plan:

## 2020-02-10 NOTE — Assessment & Plan Note (Signed)
I will check a bmp

## 2020-02-10 NOTE — Assessment & Plan Note (Signed)
I suspect her rash is photosensitivity to doxycycline.  She has refused to restart but she was counseled to use sun block and long sleeves and avoid sun exposure when possible if she decides to restart it.

## 2020-02-10 NOTE — Assessment & Plan Note (Addendum)
I again discussed with her the plan and the reasoning of her treatment regimen with the IV, followed by continuation treatment and then suppression.  She has decided she will not restart the doxycycline and is sulfa allergic so no real options for MRSA coverage. I had also encourage her taking the Keflex 4 times a day and she tells me she will "try".  I certain will have her remain on it for suppression regardless going forward.  I did discuss the potential consequences of relapse of infection including the possibility of amputation.  She voiced her understanding.  I will check her inflammatory markers as well.  rtc 3 months

## 2020-02-11 LAB — C-REACTIVE PROTEIN: CRP: 20.1 mg/L — ABNORMAL HIGH (ref <8.0)

## 2020-02-11 LAB — BASIC METABOLIC PANEL
BUN/Creatinine Ratio: 8 (calc) (ref 6–22)
BUN: 8 mg/dL (ref 7–25)
CO2: 23 mmol/L (ref 20–32)
Calcium: 8.5 mg/dL — ABNORMAL LOW (ref 8.6–10.4)
Chloride: 107 mmol/L (ref 98–110)
Creat: 0.99 mg/dL — ABNORMAL HIGH (ref 0.60–0.93)
Glucose, Bld: 130 mg/dL — ABNORMAL HIGH (ref 65–99)
Potassium: 3.9 mmol/L (ref 3.5–5.3)
Sodium: 140 mmol/L (ref 135–146)

## 2020-02-11 LAB — SEDIMENTATION RATE: Sed Rate: 31 mm/h — ABNORMAL HIGH (ref 0–30)

## 2020-03-23 ENCOUNTER — Other Ambulatory Visit: Payer: Self-pay | Admitting: Family Medicine

## 2020-04-17 ENCOUNTER — Other Ambulatory Visit: Payer: Self-pay

## 2020-04-17 MED ORDER — CEPHALEXIN 500 MG PO CAPS: 500.0000 mg | ORAL_CAPSULE | Freq: Four times a day (QID) | ORAL | 5 refills | Status: DC

## 2020-05-03 ENCOUNTER — Encounter: Payer: Self-pay | Admitting: Internal Medicine

## 2020-05-03 ENCOUNTER — Ambulatory Visit: Payer: Medicare Other | Admitting: Internal Medicine

## 2020-05-03 ENCOUNTER — Other Ambulatory Visit: Payer: Self-pay

## 2020-05-03 VITALS — BP 105/63 | HR 73

## 2020-05-03 DIAGNOSIS — R21 Rash and other nonspecific skin eruption: Secondary | ICD-10-CM

## 2020-05-03 DIAGNOSIS — T8454XD Infection and inflammatory reaction due to internal left knee prosthesis, subsequent encounter: Secondary | ICD-10-CM | POA: Diagnosis not present

## 2020-05-03 MED ORDER — CEPHALEXIN 500 MG PO CAPS: 500.0000 mg | ORAL_CAPSULE | Freq: Three times a day (TID) | ORAL | 5 refills | Status: DC

## 2020-05-03 NOTE — Progress Notes (Signed)
   Subjective:    Patient ID: [name], female    DOB: [birth year], 74 y.o.   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  This was one of multiple previous surgeries and is an attempt at limb salvage.  She previously had seen Dr. Charlann Boxer in 2018 and had done a 2 stage revision and treated with cefazolin and then reimplanted.  She returned to him recently and underwent the above precedure with pain and swelling in the knee.  She completed 6 weeks of IV vancomycin though stopped the ceftriaxone a little early due to diarrhea and was switched to oral Keflex. After the 6 weeks, she was converted to oral doxycycline and Keflex for a presumed 6 months continuation therapy followed by Keflex bid for likely lifetime suppression.  She had stopped the doxycycline due to rash and was taking the Keflex twice a day then changed back to 4 times a day.  Some intermittent knee swelling.  Redness on her left leg around the calf as well.  Some pain with walking but tolerable.      Review of Systems  Constitutional: Negative for fever.  Gastrointestinal: Negative for diarrhea and nausea.  Skin: Negative for rash.       Objective:   Physical Exam Constitutional:      Appearance: Normal appearance.  Eyes:     General: No scleral icterus. Pulmonary:     Effort: Pulmonary effort is normal.  Musculoskeletal:     Comments: Knee with some swelling, minimal warmth  Skin:    Findings: No rash.  Neurological:     General: No focal deficit present.     Mental Status: She is alert.  Psychiatric:        Mood and Affect: Mood normal.    Sh: no current tobacco use       Assessment & Plan:

## 2020-05-03 NOTE — Assessment & Plan Note (Signed)
She continues to do relatively well.  At this point, I will transition her to oral suppressive therapy with Keflex 3 times a day and likely will keep her on this lifelong.  Can transition to twice a day if there are any issues.   rtc in 6 months.

## 2020-05-03 NOTE — Assessment & Plan Note (Signed)
This has resolved.

## 2020-05-04 LAB — BASIC METABOLIC PANEL
BUN/Creatinine Ratio: 11 (calc) (ref 6–22)
BUN: 11 mg/dL (ref 7–25)
CO2: 27 mmol/L (ref 20–32)
Calcium: 9.1 mg/dL (ref 8.6–10.4)
Chloride: 103 mmol/L (ref 98–110)
Creat: 1.01 mg/dL — ABNORMAL HIGH (ref 0.60–0.93)
Glucose, Bld: 106 mg/dL — ABNORMAL HIGH (ref 65–99)
Potassium: 4.3 mmol/L (ref 3.5–5.3)
Sodium: 138 mmol/L (ref 135–146)

## 2020-05-04 LAB — C-REACTIVE PROTEIN: CRP: 8.3 mg/L — ABNORMAL HIGH (ref <8.0)

## 2020-05-04 LAB — SEDIMENTATION RATE: Sed Rate: 19 mm/h (ref 0–30)

## 2020-05-09 ENCOUNTER — Telehealth: Payer: Self-pay | Admitting: *Deleted

## 2020-05-09 NOTE — Telephone Encounter (Signed)
Patient calling for her results.  Please advise if she needs to change her keflex to twice a day, or keep at three times a day. Andree Coss, RN

## 2020-05-16 NOTE — Telephone Encounter (Signed)
Left message asking patient to call back for feedback and plan.

## 2020-05-16 NOTE — Telephone Encounter (Signed)
Her labs look good, near normal CRP, normal ESR now much better.  The ideal is for her to stay on it three times a day if possible.  thanks

## 2020-05-16 NOTE — Telephone Encounter (Signed)
Patient called back regarding missed call. Relayed message. Lorenso Courier, [place]

## 2020-11-01 ENCOUNTER — Ambulatory Visit: Payer: Medicare Other | Admitting: Internal Medicine

## 2020-11-08 ENCOUNTER — Encounter: Payer: Self-pay | Admitting: Internal Medicine

## 2020-11-08 ENCOUNTER — Ambulatory Visit: Payer: Medicare Other | Admitting: Internal Medicine

## 2020-11-08 ENCOUNTER — Other Ambulatory Visit: Payer: Self-pay

## 2020-11-08 VITALS — BP 96/62 | HR 77 | Wt 154.0 lb

## 2020-11-08 DIAGNOSIS — Z5181 Encounter for therapeutic drug level monitoring: Secondary | ICD-10-CM

## 2020-11-08 DIAGNOSIS — T8454XD Infection and inflammatory reaction due to internal left knee prosthesis, subsequent encounter: Secondary | ICD-10-CM | POA: Diagnosis not present

## 2020-11-08 MED ORDER — CEPHALEXIN 500 MG PO CAPS: 500.0000 mg | ORAL_CAPSULE | Freq: Three times a day (TID) | ORAL | 11 refills | Status: DC

## 2020-11-08 NOTE — Progress Notes (Signed)
   Subjective:    Patient ID: [name], female    DOB: 1945/09/23, 75 y.o.   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  This was one of multiple previous surgeries and is an attempt at limb salvage.  She previously had seen Dr. Charlann Boxer in 2018 and had done a 2 stage revision and treated with cefazolin and then reimplanted.  She returned to him recently and underwent the above precedure with pain and swelling in the knee.  She completed 6 weeks of IV vancomycin though stopped the ceftriaxone a little early due to diarrhea and was switched to oral Keflex. After the 6 weeks, she was converted to oral doxycycline and Keflex for a presumed 6 months continuation therapy followed by Keflex suppression ongoing.   She reports some increased knee swelling and significantly more pain in her leg with walking.  She feels like the rod in her leg is moving.  Pain with standing mainly on the leg.  She has not been wearing the knee brace much and does not always use the walker.  Swelling is about the same but with some intermittent warmth/heat noted.   No associated rash or diarrhea.       Review of Systems  Constitutional: Negative for fever.  Gastrointestinal: Negative for diarrhea.  Skin: Negative for rash.       Objective:   Physical Exam Eyes:     General: No scleral icterus. Pulmonary:     Effort: Pulmonary effort is normal. No respiratory distress.  Musculoskeletal:     Comments: Knee with similar swelling and no warmth.    Skin:    Findings: No rash.  Neurological:     Mental Status: She is alert.  Psychiatric:        Mood and Affect: Mood normal.   SH: no tobacco use       Assessment & Plan:

## 2020-11-08 NOTE — Assessment & Plan Note (Signed)
I will check a cmp on Keflex.

## 2020-11-08 NOTE — Assessment & Plan Note (Addendum)
Knee is stable clinically but she has been having more pain in the last 2-3 weeks.  I suspect some of this is due to mechanical issues such as not using the walker or knee brace and favoring one side.  Also with left hip pain likely from those reasons.   She will continue with the Keflex 500 mg tid but if her pain persists, I did discuss that she may need to go back to Dr. Alvan Dame and re-consider amputation.  Her outcome likely would be improved mobility.   I will check an ESR and CRP today.  Otherwise, no changes and she can rtc in 1 year.

## 2020-11-09 LAB — C-REACTIVE PROTEIN: CRP: 6.4 mg/L (ref <8.0)

## 2020-11-09 LAB — SEDIMENTATION RATE: Sed Rate: 11 mm/h (ref 0–30)

## 2020-11-09 LAB — COMPREHENSIVE METABOLIC PANEL
AG Ratio: 1.5 (calc) (ref 1.0–2.5)
ALT: 5 U/L — ABNORMAL LOW (ref 6–29)
AST: 15 U/L (ref 10–35)
Albumin: 3.6 g/dL (ref 3.6–5.1)
Alkaline phosphatase (APISO): 118 U/L (ref 37–153)
BUN/Creatinine Ratio: 12 (calc) (ref 6–22)
BUN: 11 mg/dL (ref 7–25)
CO2: 27 mmol/L (ref 20–32)
Calcium: 8.6 mg/dL (ref 8.6–10.4)
Chloride: 105 mmol/L (ref 98–110)
Creat: 0.94 mg/dL — ABNORMAL HIGH (ref 0.60–0.93)
Globulin: 2.4 g/dL (calc) (ref 1.9–3.7)
Glucose, Bld: 86 mg/dL (ref 65–99)
Potassium: 3.9 mmol/L (ref 3.5–5.3)
Sodium: 140 mmol/L (ref 135–146)
Total Bilirubin: 0.3 mg/dL (ref 0.2–1.2)
Total Protein: 6 g/dL — ABNORMAL LOW (ref 6.1–8.1)

## 2021-08-28 ENCOUNTER — Encounter: Payer: Self-pay | Admitting: Internal Medicine

## 2021-08-28 ENCOUNTER — Ambulatory Visit: Payer: Medicare Other | Admitting: Internal Medicine

## 2021-08-28 ENCOUNTER — Other Ambulatory Visit: Payer: Self-pay

## 2021-08-28 DIAGNOSIS — N898 Other specified noninflammatory disorders of vagina: Secondary | ICD-10-CM

## 2021-08-28 NOTE — Progress Notes (Signed)
[facility]      Reason for Consult: vaginal swab positive for bacteria    Referring Physician: Dr. April Holding    Patient ID: [name], female    DOB: 1946-05-21, 76 y.o.   MRN: [id number]  HPI:   She is here for evaluation of ongoing vaginal discharge.  She has had ongoing vaginal discharge for several months and cultures with Enterococcus and E coli, ESBL.  She has reportedly been on several treatments including cephalosporins, clindamycin gel and steroid treatments.  No fever, no weight loss.  She says she was given penicillin.  She has noted redness of her labia, skin irritation of her perineum and rectal discharge.  Previous exams at her gynecology have noted no irritation or significant findings.  Sent here due to multi drug resistance of swab cultures done including ESBL E coli.  Also a positive Enterococcus swab but no sensitivities noted.    Past Medical History:  Diagnosis Date   A-fib (HCC)    PAROXYSMAL  AFIB ON ELIQUIS , MGD BY CARDIO DR Willeen Cass AT [facility]    Anxiety    Arthritis    CHF (congestive heart failure) (HCC)    diastolic Hf   Dyspnea    at times   Dysrhythmia    a fib   Family history of adverse reaction to anesthesia    Daughter hard to wake up   GERD (gastroesophageal reflux disease)    Headache    hx of   History of hiatal hernia    History of kidney stones    Hyperlipidemia    Hypertension    Hypothyroidism    Legally blind    per patient ,patient reports she can only see a blur of images;  needs visual guidance for day to day activities ;    Palpitations    Pneumonia    Pulmonary HTN (HCC)    moderate, see ECHO care everywhere ; patient denies any increases repiratory effort today    Restless leg syndrome    Retinitis pigmentosa     Prior to Admission medications   Medication Sig Start Date End Date Taking? Authorizing Provider  ALPRAZolam Prudy Feeler) 1 MG tablet Take 1 mg by mouth at bedtime. Patient not taking:  Reported on 05/03/2020    [provider]  apixaban (ELIQUIS) 5 MG TABS tablet Take 5 mg by mouth 2 (two) times daily.    [provider]  atenolol (TENORMIN) 50 MG tablet Take 50 mg by mouth daily.    [provider]  carbidopa-levodopa (SINEMET IR) 25-100 MG tablet carbidopa 25 mg-levodopa 100 mg tablet  TAKE 1 TABLET IN THE MORNING, 1 TABLET AT NOON, AND 1/2 TABLET INN THE EVENING 11/11/19   [provider]  cephALEXin (KEFLEX) 500 MG capsule Take 1 capsule (500 mg total) by mouth 3 (three) times daily. 11/08/20   Gardiner Barefoot, MD  citalopram (CELEXA) 40 MG tablet Take 40 mg by mouth every evening. Patient not taking: Reported on 05/03/2020    [provider]  clonazePAM (KLONOPIN) 0.5 MG tablet clonazepam 0.5 mg tablet  TAKE ONE TABLET (0.5 MG DOSE) BY MOUTH 2 (TWO) TIMES A DAY AS NEEDED FOR ANXIETY.    [provider]  cloNIDine (CATAPRES) 0.1 MG tablet Take 0.05-0.1 mg by mouth daily as needed. 10/09/20   [provider]  diclofenac Sodium (VOLTAREN) 1 % GEL diclofenac 1 % topical gel  PLACE TWO G TO FOUR G ONTO  THE SKIN 4 (FOUR) TIMES A DAY AS NEEDED FOR UP TO 30 DAYS.    [provider]  docusate sodium (COLACE) 100 MG capsule Take 1 capsule (100 mg total) by mouth 2 (two) times daily. 12/17/18   Lanney GinsBabish, Matthew, PA-C  DULoxetine (CYMBALTA) 60 MG capsule Take 1 a day 12/07/19   [provider]  ferrous sulfate (FERROUSUL) 325 (65 FE) MG tablet Take 1 tablet (325 mg total) by mouth 3 (three) times daily with meals. 12/17/18   Lanney GinsBabish, Matthew, PA-C  furosemide (LASIX) 40 MG tablet Take 40 mg by mouth daily.    [provider]  gabapentin (NEURONTIN) 600 MG tablet Take 1,200 mg by mouth at bedtime.     [provider]  HYDROcodone-acetaminophen (NORCO) 7.5-325 MG tablet Take 1-2 tablets by mouth every 4 (four) hours as needed for moderate pain. 12/17/18   Lanney GinsBabish, Matthew, PA-C  hydrOXYzine (VISTARIL)  25 MG capsule Take by mouth. 11/08/20   [provider]  levothyroxine (SYNTHROID, LEVOTHROID) 112 MCG tablet Take 112 mcg by mouth every evening.     [provider]  loperamide (IMODIUM) 2 MG capsule Take 1 capsule (2 mg total) by mouth as needed for diarrhea or loose stools. 01/18/19   Gardiner Barefootomer, Jediah Horger W, MD  Melatonin 10 MG TABS Take 20 mg by mouth at bedtime. Patient not taking: No sig reported    [provider]  methocarbamol (ROBAXIN) 500 MG tablet Take 1 tablet (500 mg total) by mouth every 6 (six) hours as needed for muscle spasms. Patient not taking: No sig reported 12/17/18   Lanney GinsBabish, Matthew, PA-C  OLANZapine (ZYPREXA) 5 MG tablet Take 5 mg by mouth at bedtime. 10/08/20   [provider]  ondansetron (ZOFRAN) 4 MG tablet Take 4 mg by mouth every morning. 10/16/20   [provider]  pantoprazole (PROTONIX) 40 MG tablet Take 40 mg by mouth daily.    [provider]  polyethylene glycol (MIRALAX / GLYCOLAX) 17 g packet Take 17 g by mouth 2 (two) times daily. Patient not taking: Reported on 05/03/2020 12/17/18   Lanney GinsBabish, Matthew, PA-C  potassium chloride (K-DUR,KLOR-CON) 10 MEQ tablet Take 10 mEq by mouth daily as needed (poatassium).     [provider]  rOPINIRole (REQUIP) 2 MG tablet Take 2 mg by mouth 4 (four) times daily.     [provider]  rosuvastatin (CRESTOR) 10 MG tablet Take 10 mg by mouth daily. 10/12/20   [provider]  traZODone (DESYREL) 150 MG tablet Take 150 mg by mouth at bedtime as needed. 10/20/20   [provider]    Allergies  Allergen Reactions   Amiodarone Other (See Comments)    Restless legs   Benadryl [Diphenhydramine Hcl] Hives   Metoprolol Other (See Comments)    Restless legs   Septra [Sulfamethoxazole-Trimethoprim] Hives   Other Rash    Metal    Social History   Tobacco Use   Smoking status: Former    Years: 28.00    Types: Cigarettes   Smokeless tobacco: Never    Tobacco comments:    quit 30 years ago  Vaping Use   Vaping Use: Never used  Substance Use Topics   Alcohol use: No   Drug use: No   FMH: + cardiac disease  Review of Systems  Constitutional: negative for fevers, chills, and fatigue Gastrointestinal: negative for diarrhea Genitourinary: positive for genital lesions and vaginal discharge Integument/breast: negative for rash All other systems reviewed and are  negative    Constitutional: in no apparent distress There were no vitals filed for this visit. EYES: anicteric GU:  Musculoskeletal: no edema Skin: no rash Neuro: non-focal  Labs: Lab Results  Component Value Date   WBC 9.1 12/18/2018   HGB 8.9 (L) 12/18/2018   HCT 31.5 (L) 12/18/2018   MCV 97.2 12/18/2018   PLT 244 12/18/2018    Lab Results  Component Value Date   CREATININE 0.94 (H) 11/08/2020   BUN 11 11/08/2020   NA 140 11/08/2020   K 3.9 11/08/2020   CL 105 11/08/2020   CO2 27 11/08/2020    Lab Results  Component Value Date   ALT 5 (L) 11/08/2020   AST 15 11/08/2020   BILITOT 0.3 11/08/2020   INR 0.9 12/14/2018     Assessment: normal labia, normal skin no signs of active infection.  Cultures noted but nothing on exam to indicate anything treatable at this time.   Discussed with Lonzo Candy, NP at the gynecology office and no active concerns for infection noted there as well.  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    Plan: 1)  no indication for further antibiotic treatment. 2) follow up with me in April for her left knee as planned.   62 minutes spent including 31 minutes spent in discussion with the patient, the referring office and review of the history.   She will follow up as needed for this and will continue to follow with me regarding her knee.

## 2021-11-07 ENCOUNTER — Ambulatory Visit: Payer: Medicare Other | Admitting: Internal Medicine

## 2021-11-28 ENCOUNTER — Ambulatory Visit: Payer: Medicare Other | Admitting: Internal Medicine

## 2021-11-28 ENCOUNTER — Encounter: Payer: Self-pay | Admitting: Internal Medicine

## 2021-11-28 ENCOUNTER — Other Ambulatory Visit: Payer: Self-pay

## 2021-11-28 VITALS — BP 125/81 | HR 88 | Temp 98.4°F | Wt 171.0 lb

## 2021-11-28 DIAGNOSIS — F200 Paranoid schizophrenia: Secondary | ICD-10-CM | POA: Diagnosis present

## 2021-11-28 DIAGNOSIS — T8454XD Infection and inflammatory reaction due to internal left knee prosthesis, subsequent encounter: Secondary | ICD-10-CM

## 2021-11-28 DIAGNOSIS — N898 Other specified noninflammatory disorders of vagina: Secondary | ICD-10-CM

## 2021-11-28 DIAGNOSIS — Z5181 Encounter for therapeutic drug level monitoring: Secondary | ICD-10-CM

## 2021-11-28 MED ORDER — CEPHALEXIN 500 MG PO CAPS: 500.0000 mg | ORAL_CAPSULE | Freq: Three times a day (TID) | ORAL | 11 refills | Status: DC

## 2021-11-29 ENCOUNTER — Encounter: Payer: Self-pay | Admitting: Internal Medicine

## 2021-11-29 LAB — COMPREHENSIVE METABOLIC PANEL
AG Ratio: 1.9 (calc) (ref 1.0–2.5)
ALT: 51 U/L — ABNORMAL HIGH (ref 6–29)
AST: 30 U/L (ref 10–35)
Albumin: 3.9 g/dL (ref 3.6–5.1)
Alkaline phosphatase (APISO): 115 U/L (ref 37–153)
BUN: 15 mg/dL (ref 7–25)
CO2: 30 mmol/L (ref 20–32)
Calcium: 9.5 mg/dL (ref 8.6–10.4)
Chloride: 105 mmol/L (ref 98–110)
Creat: 0.93 mg/dL (ref 0.60–1.00)
Globulin: 2.1 g/dL (calc) (ref 1.9–3.7)
Glucose, Bld: 126 mg/dL — ABNORMAL HIGH (ref 65–99)
Potassium: 4.3 mmol/L (ref 3.5–5.3)
Sodium: 143 mmol/L (ref 135–146)
Total Bilirubin: 0.4 mg/dL (ref 0.2–1.2)
Total Protein: 6 g/dL — ABNORMAL LOW (ref 6.1–8.1)

## 2021-11-29 LAB — SEDIMENTATION RATE: Sed Rate: 19 mm/h (ref 0–30)

## 2021-11-29 LAB — C-REACTIVE PROTEIN: CRP: 9.9 mg/L — ABNORMAL HIGH (ref <8.0)

## 2021-11-29 NOTE — Progress Notes (Signed)
Patient ID: [name], female   DOB: November 13, 1945, 76 y.o.   MRN: [id number] ? ? ?Subjective:  ? ? Patient ID: [name], female    DOB: 1945/10/23, 76 y.o.   MRN: [id number] ? 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  ? 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  This was one of multiple previous surgeries and is an attempt at limb salvage.  She previously had seen Dr. Charlann Boxer in 2018 and had done a 2 stage revision and treated with cefazolin and then reimplanted.  She returned to him then and underwent the above precedure with pain and swelling in the knee.  She completed 6 weeks of IV vancomycin though stopped the ceftriaxone a little early due to diarrhea and was switched to oral Keflex. After the 6 weeks, she was converted to oral doxycycline and Keflex for a presumed 6 months continuation therapy followed by Keflex suppression ongoing.  ? ?She is here now for routine follow up. She has continued with oral cefphalexin 3 times a day and has had no issues with rash, nausea or diarrhea.  She has no new complaints today.  Her knee continues to have some swelling but she is able to continue to ambulate.  No concerns otherwise.  ? ? ? ? ?Review of Systems  ?Constitutional:  Negative for chills and fever.  ?Gastrointestinal:  Negative for diarrhea and nausea.  ?Skin:  Negative for rash.  ? ?   ?Objective:  ? Physical Exam ?Constitutional:   ?   Appearance: Normal appearance.  ?Eyes:  ?   General: No scleral icterus. ?Pulmonary:  ?   Effort: Pulmonary effort is normal.  ?Musculoskeletal:  ?   Comments: Knee with typical swelling, no significant warmth  ?Skin: ?   Findings: No rash.  ?Psychiatric:     ?   Mood and Affect: Mood normal.     ?   Judgment: Judgment normal.  ? ?SH: no tobacco ? ? ? ?   ?Assessment & Plan:  ? ?

## 2021-11-29 NOTE — Assessment & Plan Note (Signed)
Will check a cmp on treatment ?

## 2021-11-29 NOTE — Assessment & Plan Note (Signed)
Chronic and stable on the cephalexin.  Since she is still doing well this far out, this will hopefully continue to be the case and she will not suffer any other acute exacerbation in the knee.   ?She understands that any reinfection would not be treatable by antibiotics.   ?Refills for the year provided.  ?

## 2021-11-29 NOTE — Assessment & Plan Note (Signed)
This cleared and has resolved.  No further issues.  ?

## 2022-05-10 DIAGNOSIS — G20A1 Parkinson's disease without dyskinesia, without mention of fluctuations: Secondary | ICD-10-CM | POA: Insufficient documentation

## 2022-05-10 DIAGNOSIS — Z8659 Personal history of other mental and behavioral disorders: Secondary | ICD-10-CM | POA: Insufficient documentation

## 2022-07-06 ENCOUNTER — Other Ambulatory Visit: Payer: Self-pay

## 2022-07-06 ENCOUNTER — Encounter (HOSPITAL_COMMUNITY): Payer: Self-pay | Admitting: Emergency Medicine

## 2022-07-06 ENCOUNTER — Inpatient Hospital Stay (HOSPITAL_COMMUNITY)
Admission: EM | Admit: 2022-07-06 | Discharge: 2022-07-15 | DRG: 974 | Disposition: A | Discharge status: Home / Self Care | Payer: Medicare Other | Attending: Family Medicine | Admitting: Family Medicine

## 2022-07-06 DIAGNOSIS — S8992XA Unspecified injury of left lower leg, initial encounter: Secondary | ICD-10-CM | POA: Diagnosis present

## 2022-07-06 DIAGNOSIS — M1990 Unspecified osteoarthritis, unspecified site: Secondary | ICD-10-CM | POA: Diagnosis present

## 2022-07-06 DIAGNOSIS — W19XXXA Unspecified fall, initial encounter: Secondary | ICD-10-CM

## 2022-07-06 DIAGNOSIS — J189 Pneumonia, unspecified organism: Principal | ICD-10-CM | POA: Diagnosis present

## 2022-07-06 DIAGNOSIS — R062 Wheezing: Secondary | ICD-10-CM | POA: Insufficient documentation

## 2022-07-06 DIAGNOSIS — R443 Hallucinations, unspecified: Secondary | ICD-10-CM | POA: Diagnosis present

## 2022-07-06 DIAGNOSIS — R410 Disorientation, unspecified: Secondary | ICD-10-CM | POA: Diagnosis not present

## 2022-07-06 DIAGNOSIS — I272 Pulmonary hypertension, unspecified: Secondary | ICD-10-CM | POA: Diagnosis present

## 2022-07-06 DIAGNOSIS — Z79891 Long term (current) use of opiate analgesic: Secondary | ICD-10-CM

## 2022-07-06 DIAGNOSIS — G212 Secondary parkinsonism due to other external agents: Secondary | ICD-10-CM

## 2022-07-06 DIAGNOSIS — Z96652 Presence of left artificial knee joint: Secondary | ICD-10-CM | POA: Diagnosis present

## 2022-07-06 DIAGNOSIS — G219 Secondary parkinsonism, unspecified: Secondary | ICD-10-CM | POA: Diagnosis present

## 2022-07-06 DIAGNOSIS — E538 Deficiency of other specified B group vitamins: Secondary | ICD-10-CM | POA: Diagnosis present

## 2022-07-06 DIAGNOSIS — J9601 Acute respiratory failure with hypoxia: Secondary | ICD-10-CM | POA: Diagnosis present

## 2022-07-06 DIAGNOSIS — G2581 Restless legs syndrome: Secondary | ICD-10-CM | POA: Diagnosis present

## 2022-07-06 DIAGNOSIS — I5033 Acute on chronic diastolic (congestive) heart failure: Secondary | ICD-10-CM | POA: Diagnosis present

## 2022-07-06 DIAGNOSIS — Z91048 Other nonmedicinal substance allergy status: Secondary | ICD-10-CM

## 2022-07-06 DIAGNOSIS — E785 Hyperlipidemia, unspecified: Secondary | ICD-10-CM | POA: Diagnosis present

## 2022-07-06 DIAGNOSIS — H3552 Pigmentary retinal dystrophy: Secondary | ICD-10-CM | POA: Diagnosis present

## 2022-07-06 DIAGNOSIS — Z888 Allergy status to other drugs, medicaments and biological substances status: Secondary | ICD-10-CM

## 2022-07-06 DIAGNOSIS — B20 Human immunodeficiency virus [HIV] disease: Secondary | ICD-10-CM | POA: Diagnosis present

## 2022-07-06 DIAGNOSIS — F0393 Unspecified dementia, unspecified severity, with mood disturbance: Secondary | ICD-10-CM | POA: Diagnosis present

## 2022-07-06 DIAGNOSIS — M25562 Pain in left knee: Secondary | ICD-10-CM

## 2022-07-06 DIAGNOSIS — G2401 Drug induced subacute dyskinesia: Secondary | ICD-10-CM | POA: Diagnosis present

## 2022-07-06 DIAGNOSIS — Z792 Long term (current) use of antibiotics: Secondary | ICD-10-CM

## 2022-07-06 DIAGNOSIS — H548 Legal blindness, as defined in USA: Secondary | ICD-10-CM | POA: Diagnosis present

## 2022-07-06 DIAGNOSIS — Z79890 Hormone replacement therapy (postmenopausal): Secondary | ICD-10-CM

## 2022-07-06 DIAGNOSIS — Z87891 Personal history of nicotine dependence: Secondary | ICD-10-CM

## 2022-07-06 DIAGNOSIS — E039 Hypothyroidism, unspecified: Secondary | ICD-10-CM | POA: Diagnosis present

## 2022-07-06 DIAGNOSIS — T424X5A Adverse effect of benzodiazepines, initial encounter: Secondary | ICD-10-CM | POA: Diagnosis not present

## 2022-07-06 DIAGNOSIS — F200 Paranoid schizophrenia: Secondary | ICD-10-CM | POA: Diagnosis present

## 2022-07-06 DIAGNOSIS — I700 Atherosclerosis of aorta: Secondary | ICD-10-CM | POA: Diagnosis present

## 2022-07-06 DIAGNOSIS — Z6830 Body mass index (BMI) 30.0-30.9, adult: Secondary | ICD-10-CM

## 2022-07-06 DIAGNOSIS — Z881 Allergy status to other antibiotic agents status: Secondary | ICD-10-CM

## 2022-07-06 DIAGNOSIS — R9431 Abnormal electrocardiogram [ECG] [EKG]: Secondary | ICD-10-CM | POA: Diagnosis present

## 2022-07-06 DIAGNOSIS — Z79899 Other long term (current) drug therapy: Secondary | ICD-10-CM

## 2022-07-06 DIAGNOSIS — W010XXA Fall on same level from slipping, tripping and stumbling without subsequent striking against object, initial encounter: Secondary | ICD-10-CM | POA: Diagnosis present

## 2022-07-06 DIAGNOSIS — I110 Hypertensive heart disease with heart failure: Secondary | ICD-10-CM | POA: Diagnosis present

## 2022-07-06 DIAGNOSIS — Z7901 Long term (current) use of anticoagulants: Secondary | ICD-10-CM

## 2022-07-06 DIAGNOSIS — K219 Gastro-esophageal reflux disease without esophagitis: Secondary | ICD-10-CM | POA: Diagnosis present

## 2022-07-06 DIAGNOSIS — E669 Obesity, unspecified: Secondary | ICD-10-CM | POA: Diagnosis present

## 2022-07-06 DIAGNOSIS — I480 Paroxysmal atrial fibrillation: Secondary | ICD-10-CM | POA: Diagnosis present

## 2022-07-06 DIAGNOSIS — I10 Essential (primary) hypertension: Secondary | ICD-10-CM | POA: Diagnosis present

## 2022-07-06 DIAGNOSIS — Z1152 Encounter for screening for COVID-19: Secondary | ICD-10-CM

## 2022-07-06 DIAGNOSIS — K449 Diaphragmatic hernia without obstruction or gangrene: Secondary | ICD-10-CM | POA: Diagnosis present

## 2022-07-06 NOTE — ED Triage Notes (Signed)
Pt c/o left knee pain after falling twice today. Pt has hx of knee replacement

## 2022-07-07 ENCOUNTER — Emergency Department (HOSPITAL_COMMUNITY): Payer: Medicare Other

## 2022-07-07 ENCOUNTER — Inpatient Hospital Stay (HOSPITAL_COMMUNITY): Payer: Medicare Other

## 2022-07-07 ENCOUNTER — Encounter (HOSPITAL_COMMUNITY): Payer: Self-pay | Admitting: Internal Medicine

## 2022-07-07 DIAGNOSIS — E785 Hyperlipidemia, unspecified: Secondary | ICD-10-CM | POA: Diagnosis present

## 2022-07-07 DIAGNOSIS — S8992XA Unspecified injury of left lower leg, initial encounter: Secondary | ICD-10-CM | POA: Diagnosis present

## 2022-07-07 DIAGNOSIS — F200 Paranoid schizophrenia: Secondary | ICD-10-CM | POA: Diagnosis present

## 2022-07-07 DIAGNOSIS — H548 Legal blindness, as defined in USA: Secondary | ICD-10-CM | POA: Diagnosis present

## 2022-07-07 DIAGNOSIS — I480 Paroxysmal atrial fibrillation: Secondary | ICD-10-CM | POA: Diagnosis present

## 2022-07-07 DIAGNOSIS — I5033 Acute on chronic diastolic (congestive) heart failure: Secondary | ICD-10-CM | POA: Diagnosis present

## 2022-07-07 DIAGNOSIS — W19XXXA Unspecified fall, initial encounter: Secondary | ICD-10-CM | POA: Diagnosis not present

## 2022-07-07 DIAGNOSIS — J189 Pneumonia, unspecified organism: Secondary | ICD-10-CM | POA: Diagnosis present

## 2022-07-07 DIAGNOSIS — I272 Pulmonary hypertension, unspecified: Secondary | ICD-10-CM | POA: Diagnosis present

## 2022-07-07 DIAGNOSIS — Z1152 Encounter for screening for COVID-19: Secondary | ICD-10-CM | POA: Diagnosis not present

## 2022-07-07 DIAGNOSIS — E039 Hypothyroidism, unspecified: Secondary | ICD-10-CM | POA: Diagnosis present

## 2022-07-07 DIAGNOSIS — B20 Human immunodeficiency virus [HIV] disease: Secondary | ICD-10-CM | POA: Diagnosis present

## 2022-07-07 DIAGNOSIS — J9601 Acute respiratory failure with hypoxia: Secondary | ICD-10-CM | POA: Diagnosis present

## 2022-07-07 DIAGNOSIS — G2401 Drug induced subacute dyskinesia: Secondary | ICD-10-CM | POA: Diagnosis present

## 2022-07-07 DIAGNOSIS — I110 Hypertensive heart disease with heart failure: Secondary | ICD-10-CM | POA: Diagnosis present

## 2022-07-07 DIAGNOSIS — H3552 Pigmentary retinal dystrophy: Secondary | ICD-10-CM | POA: Diagnosis present

## 2022-07-07 DIAGNOSIS — I700 Atherosclerosis of aorta: Secondary | ICD-10-CM | POA: Diagnosis present

## 2022-07-07 DIAGNOSIS — G212 Secondary parkinsonism due to other external agents: Secondary | ICD-10-CM | POA: Diagnosis not present

## 2022-07-07 DIAGNOSIS — E669 Obesity, unspecified: Secondary | ICD-10-CM | POA: Diagnosis present

## 2022-07-07 DIAGNOSIS — G2581 Restless legs syndrome: Secondary | ICD-10-CM | POA: Diagnosis present

## 2022-07-07 DIAGNOSIS — F0393 Unspecified dementia, unspecified severity, with mood disturbance: Secondary | ICD-10-CM | POA: Diagnosis present

## 2022-07-07 DIAGNOSIS — M25562 Pain in left knee: Secondary | ICD-10-CM | POA: Diagnosis present

## 2022-07-07 DIAGNOSIS — M1990 Unspecified osteoarthritis, unspecified site: Secondary | ICD-10-CM | POA: Diagnosis present

## 2022-07-07 DIAGNOSIS — G219 Secondary parkinsonism, unspecified: Secondary | ICD-10-CM | POA: Diagnosis present

## 2022-07-07 DIAGNOSIS — R9431 Abnormal electrocardiogram [ECG] [EKG]: Secondary | ICD-10-CM | POA: Diagnosis present

## 2022-07-07 DIAGNOSIS — W010XXA Fall on same level from slipping, tripping and stumbling without subsequent striking against object, initial encounter: Secondary | ICD-10-CM | POA: Diagnosis present

## 2022-07-07 DIAGNOSIS — K219 Gastro-esophageal reflux disease without esophagitis: Secondary | ICD-10-CM | POA: Diagnosis present

## 2022-07-07 DIAGNOSIS — K449 Diaphragmatic hernia without obstruction or gangrene: Secondary | ICD-10-CM | POA: Diagnosis present

## 2022-07-07 DIAGNOSIS — R410 Disorientation, unspecified: Secondary | ICD-10-CM | POA: Diagnosis not present

## 2022-07-07 LAB — URINALYSIS, ROUTINE W REFLEX MICROSCOPIC
Bacteria, UA: NONE SEEN
Bilirubin Urine: NEGATIVE
Glucose, UA: NEGATIVE mg/dL
Hgb urine dipstick: NEGATIVE
Ketones, ur: 5 mg/dL — AB
Nitrite: NEGATIVE
Protein, ur: NEGATIVE mg/dL
Specific Gravity, Urine: 1.029 (ref 1.005–1.030)
pH: 5 (ref 5.0–8.0)

## 2022-07-07 LAB — CBC WITH DIFFERENTIAL/PLATELET
Abs Immature Granulocytes: 0.06 10*3/uL (ref 0.00–0.07)
Basophils Absolute: 0.1 10*3/uL (ref 0.0–0.1)
Basophils Relative: 1 %
Eosinophils Absolute: 0.3 10*3/uL (ref 0.0–0.5)
Eosinophils Relative: 2 %
HCT: 35.9 % — ABNORMAL LOW (ref 36.0–46.0)
Hemoglobin: 10.9 g/dL — ABNORMAL LOW (ref 12.0–15.0)
Immature Granulocytes: 1 %
Lymphocytes Relative: 10 %
Lymphs Abs: 1.3 10*3/uL (ref 0.7–4.0)
MCH: 30 pg (ref 26.0–34.0)
MCHC: 30.4 g/dL (ref 30.0–36.0)
MCV: 98.9 fL (ref 80.0–100.0)
Monocytes Absolute: 0.6 10*3/uL (ref 0.1–1.0)
Monocytes Relative: 5 %
Neutro Abs: 10.7 10*3/uL — ABNORMAL HIGH (ref 1.7–7.7)
Neutrophils Relative %: 81 %
Platelets: 222 10*3/uL (ref 150–400)
RBC: 3.63 MIL/uL — ABNORMAL LOW (ref 3.87–5.11)
RDW: 15.1 % (ref 11.5–15.5)
WBC: 13 10*3/uL — ABNORMAL HIGH (ref 4.0–10.5)
nRBC: 0 % (ref 0.0–0.2)

## 2022-07-07 LAB — COMPREHENSIVE METABOLIC PANEL
ALT: 7 U/L (ref 0–44)
AST: 17 U/L (ref 15–41)
Albumin: 3.5 g/dL (ref 3.5–5.0)
Alkaline Phosphatase: 83 U/L (ref 38–126)
Anion gap: 6 (ref 5–15)
BUN: 18 mg/dL (ref 8–23)
CO2: 30 mmol/L (ref 22–32)
Calcium: 8.3 mg/dL — ABNORMAL LOW (ref 8.9–10.3)
Chloride: 104 mmol/L (ref 98–111)
Creatinine, Ser: 1.13 mg/dL — ABNORMAL HIGH (ref 0.44–1.00)
GFR, Estimated: 50 mL/min — ABNORMAL LOW (ref >60)
Glucose, Bld: 138 mg/dL — ABNORMAL HIGH (ref 70–99)
Potassium: 3.8 mmol/L (ref 3.5–5.1)
Sodium: 140 mmol/L (ref 135–145)
Total Bilirubin: 0.6 mg/dL (ref 0.3–1.2)
Total Protein: 6.4 g/dL — ABNORMAL LOW (ref 6.5–8.1)

## 2022-07-07 LAB — RESP PANEL BY RT-PCR (FLU A&B, COVID) ARPGX2
Influenza A by PCR: NEGATIVE
Influenza B by PCR: NEGATIVE
SARS Coronavirus 2 by RT PCR: NEGATIVE

## 2022-07-07 LAB — VITAMIN B12: Vitamin B-12: 197 pg/mL (ref 180–914)

## 2022-07-07 LAB — TROPONIN I (HIGH SENSITIVITY)
Troponin I (High Sensitivity): 3 ng/L (ref <18)
Troponin I (High Sensitivity): 6 ng/L (ref <18)

## 2022-07-07 LAB — TSH: TSH: 1.718 u[IU]/mL (ref 0.350–4.500)

## 2022-07-07 LAB — BLOOD GAS, VENOUS
Acid-Base Excess: 5.7 mmol/L — ABNORMAL HIGH (ref 0.0–2.0)
Bicarbonate: 31.9 mmol/L — ABNORMAL HIGH (ref 20.0–28.0)
O2 Saturation: 95.8 %
Patient temperature: 37
pCO2, Ven: 54 mmHg (ref 44–60)
pH, Ven: 7.38 (ref 7.25–7.43)
pO2, Ven: 70 mmHg — ABNORMAL HIGH (ref 32–45)

## 2022-07-07 LAB — MAGNESIUM: Magnesium: 2.8 mg/dL — ABNORMAL HIGH (ref 1.7–2.4)

## 2022-07-07 LAB — BRAIN NATRIURETIC PEPTIDE: B Natriuretic Peptide: 316.8 pg/mL — ABNORMAL HIGH (ref 0.0–100.0)

## 2022-07-07 MED ORDER — FUROSEMIDE 40 MG PO TABS
40.0000 mg | ORAL_TABLET | Freq: Every day | ORAL | Status: DC
  Administered 2022-07-08 – 2022-07-15 (×8): 40 mg via ORAL
  Filled 2022-07-07 (×8): qty 1

## 2022-07-07 MED ORDER — LEVOTHYROXINE SODIUM 88 MCG PO TABS
88.0000 ug | ORAL_TABLET | Freq: Every day | ORAL | Status: DC
  Administered 2022-07-08 – 2022-07-15 (×8): 88 ug via ORAL
  Filled 2022-07-07 (×8): qty 1

## 2022-07-07 MED ORDER — ROPINIROLE HCL 1 MG PO TABS
2.0000 mg | ORAL_TABLET | Freq: Three times a day (TID) | ORAL | Status: DC
  Administered 2022-07-07 – 2022-07-08 (×3): 2 mg via ORAL
  Filled 2022-07-07 (×3): qty 2

## 2022-07-07 MED ORDER — PANTOPRAZOLE SODIUM 40 MG PO TBEC
40.0000 mg | DELAYED_RELEASE_TABLET | Freq: Every day | ORAL | Status: DC
  Administered 2022-07-07 – 2022-07-14 (×8): 40 mg via ORAL
  Filled 2022-07-07 (×8): qty 1

## 2022-07-07 MED ORDER — SODIUM CHLORIDE 0.9 % IV SOLN
500.0000 mg | Freq: Once | INTRAVENOUS | Status: AC
  Administered 2022-07-07: 500 mg via INTRAVENOUS
  Filled 2022-07-07: qty 5

## 2022-07-07 MED ORDER — FUROSEMIDE 10 MG/ML IJ SOLN
40.0000 mg | Freq: Once | INTRAMUSCULAR | Status: AC
  Administered 2022-07-07: 40 mg via INTRAVENOUS
  Filled 2022-07-07: qty 4

## 2022-07-07 MED ORDER — ROPINIROLE HCL 1 MG PO TABS
2.0000 mg | ORAL_TABLET | Freq: Once | ORAL | Status: AC
  Administered 2022-07-07: 2 mg via ORAL
  Filled 2022-07-07: qty 2

## 2022-07-07 MED ORDER — CARBIDOPA-LEVODOPA 25-100 MG PO TABS
1.0000 | ORAL_TABLET | ORAL | Status: DC
  Administered 2022-07-07 – 2022-07-15 (×25): 1 via ORAL
  Filled 2022-07-07 (×25): qty 1

## 2022-07-07 MED ORDER — POTASSIUM CHLORIDE CRYS ER 10 MEQ PO TBCR
10.0000 meq | EXTENDED_RELEASE_TABLET | Freq: Every day | ORAL | Status: DC
  Administered 2022-07-07 – 2022-07-15 (×9): 10 meq via ORAL
  Filled 2022-07-07 (×9): qty 1

## 2022-07-07 MED ORDER — ACETAMINOPHEN 325 MG PO TABS
650.0000 mg | ORAL_TABLET | Freq: Four times a day (QID) | ORAL | Status: DC | PRN
  Administered 2022-07-07: 650 mg via ORAL
  Filled 2022-07-07 (×3): qty 2

## 2022-07-07 MED ORDER — SODIUM CHLORIDE 0.9 % IV SOLN
1.0000 g | INTRAVENOUS | Status: AC
  Administered 2022-07-08 – 2022-07-11 (×4): 1 g via INTRAVENOUS
  Filled 2022-07-07 (×4): qty 10

## 2022-07-07 MED ORDER — SODIUM CHLORIDE 0.9 % IV SOLN: 500.0000 mg | INTRAVENOUS | Status: DC

## 2022-07-07 MED ORDER — ALBUTEROL SULFATE (2.5 MG/3ML) 0.083% IN NEBU
2.5000 mg | INHALATION_SOLUTION | RESPIRATORY_TRACT | Status: DC | PRN
  Administered 2022-07-11: 2.5 mg via RESPIRATORY_TRACT

## 2022-07-07 MED ORDER — TRAZODONE HCL 50 MG PO TABS: 75.0000 mg | ORAL_TABLET | Freq: Every evening | ORAL | Status: DC | PRN

## 2022-07-07 MED ORDER — OLANZAPINE 5 MG PO TABS: 10.0000 mg | ORAL_TABLET | Freq: Every day | ORAL | Status: DC

## 2022-07-07 MED ORDER — SODIUM CHLORIDE 0.9 % IV SOLN
100.0000 mg | Freq: Two times a day (BID) | INTRAVENOUS | Status: DC
  Administered 2022-07-08 – 2022-07-09 (×4): 100 mg via INTRAVENOUS
  Filled 2022-07-07 (×5): qty 100

## 2022-07-07 MED ORDER — DULOXETINE HCL 60 MG PO CPEP
60.0000 mg | ORAL_CAPSULE | Freq: Every day | ORAL | Status: DC
  Administered 2022-07-08 – 2022-07-15 (×8): 60 mg via ORAL
  Filled 2022-07-07 (×8): qty 1

## 2022-07-07 MED ORDER — IPRATROPIUM-ALBUTEROL 0.5-2.5 (3) MG/3ML IN SOLN
3.0000 mL | Freq: Four times a day (QID) | RESPIRATORY_TRACT | Status: DC
  Administered 2022-07-07 (×2): 3 mL via RESPIRATORY_TRACT
  Filled 2022-07-07 (×2): qty 3

## 2022-07-07 MED ORDER — IOHEXOL 350 MG/ML SOLN
100.0000 mL | Freq: Once | INTRAVENOUS | Status: AC | PRN
  Administered 2022-07-07: 100 mL via INTRAVENOUS

## 2022-07-07 MED ORDER — BENZTROPINE MESYLATE 0.5 MG PO TABS
1.0000 mg | ORAL_TABLET | Freq: Every day | ORAL | Status: DC
  Administered 2022-07-07 – 2022-07-11 (×4): 1 mg via ORAL
  Filled 2022-07-07 (×5): qty 2

## 2022-07-07 MED ORDER — GABAPENTIN 300 MG PO CAPS
300.0000 mg | ORAL_CAPSULE | Freq: Three times a day (TID) | ORAL | Status: DC
  Administered 2022-07-07 – 2022-07-15 (×24): 300 mg via ORAL
  Filled 2022-07-07 (×24): qty 1

## 2022-07-07 MED ORDER — IPRATROPIUM-ALBUTEROL 0.5-2.5 (3) MG/3ML IN SOLN
3.0000 mL | Freq: Once | RESPIRATORY_TRACT | Status: AC
  Administered 2022-07-07: 3 mL via RESPIRATORY_TRACT
  Filled 2022-07-07: qty 3

## 2022-07-07 MED ORDER — AMIODARONE HCL 200 MG PO TABS
200.0000 mg | ORAL_TABLET | Freq: Two times a day (BID) | ORAL | Status: DC
  Administered 2022-07-07 – 2022-07-15 (×16): 200 mg via ORAL
  Filled 2022-07-07 (×16): qty 1

## 2022-07-07 MED ORDER — SODIUM CHLORIDE 0.9 % IV SOLN
1.0000 g | Freq: Once | INTRAVENOUS | Status: AC
  Administered 2022-07-07: 1 g via INTRAVENOUS
  Filled 2022-07-07: qty 10

## 2022-07-07 MED ORDER — ACETAMINOPHEN 650 MG RE SUPP: 650.0000 mg | Freq: Four times a day (QID) | RECTAL | Status: DC | PRN

## 2022-07-07 MED ORDER — IPRATROPIUM-ALBUTEROL 0.5-2.5 (3) MG/3ML IN SOLN
3.0000 mL | Freq: Two times a day (BID) | RESPIRATORY_TRACT | Status: DC
  Administered 2022-07-08 – 2022-07-10 (×6): 3 mL via RESPIRATORY_TRACT
  Filled 2022-07-07 (×6): qty 3

## 2022-07-07 MED ORDER — APIXABAN 5 MG PO TABS
5.0000 mg | ORAL_TABLET | Freq: Two times a day (BID) | ORAL | Status: DC
  Administered 2022-07-07 – 2022-07-15 (×17): 5 mg via ORAL
  Filled 2022-07-07 (×17): qty 1

## 2022-07-07 MED ORDER — METOPROLOL SUCCINATE ER 25 MG PO TB24
25.0000 mg | ORAL_TABLET | Freq: Every day | ORAL | Status: DC
  Administered 2022-07-08 – 2022-07-15 (×8): 25 mg via ORAL
  Filled 2022-07-07 (×8): qty 1

## 2022-07-07 MED ORDER — HYDROXYZINE HCL 25 MG PO TABS
25.0000 mg | ORAL_TABLET | Freq: Every day | ORAL | Status: DC | PRN
  Administered 2022-07-07 – 2022-07-14 (×9): 25 mg via ORAL
  Filled 2022-07-07 (×9): qty 1

## 2022-07-07 NOTE — ED Notes (Signed)
Pt sats 86% on RA. Pt placed on 2L [place] and sats 94%

## 2022-07-07 NOTE — Progress Notes (Signed)
Carryover admission to the Day Admitter.  I discussed this case with the EDP, Dr.  Madilyn Hook.  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  Started on IV Lasix as well as azithromycin and Rocephin.  Degree of swelling in the lower extremities, the he is also reports CTA chest acute pulmonary embolism, with this imaging potentially also finding some distinction between edema versus infiltrate.   Additionally, imaging associated with the left knee replacement reportedly shows some perihardware lucency, unclear significance.  He is going to contact orthopedic surgery ideations regarding specific modality of assess this finding.  Per my discussions with Dr. Madilyn Hook, she conveys that this patient with the in the stepdown unit at this time.  Consequently, I have placed an order for inpatient admission to the stepdown unit for further treatment presenting acute hypoxic respiratory distress , as above.   I have placed some additional preliminary admit orders via the adult multi-morbid admission order set. I have also ordered add-on serum magnesium level, and resumed her Eliquis.  I will defer to the admitting hospitalist decision making regarding the need/nature of additional IV diuresis and/or additional IV antibiotics.    Newton Pigg, DO Hospitalist

## 2022-07-07 NOTE — ED Provider Notes (Signed)
[facility] DEPT Provider Note   CSN: [id number] Arrival date & time: 07/06/22  2348     History  Chief Complaint  Patient presents with   Knee Pain    [name] is a 76 y.o. female.  The history is provided by the patient, a relative and medical records.  Knee Pain [name] is a 76 y.o. female who presents to the Emergency Department complaining of fall.  She presents to the emergency department accompanied by her daughter for evaluation following a fall today.  She lives with her husband and is legally blind and usually has assistance with going in and out the garage.  Today while she was in the garage she attempted to leave on her own and she tripped and fell and attempted to get back up and fell again.  She states that she twisted her left knee and has pain from the knee down.  Daughter reports that she the patient has also been short of breath for the last 1 to 2 days and gets dizzy when she tries to stand.  No reports of fevers, chest pain.  Patient does have a history of dementia, which limits some of the history taking.  She has a history of infected knee replacement and was recently treated with antibiotics for possible cellulitis of the lower extremity.  She also has a history of atrial fibrillation on anticoagulation, CHF.    Home Medications Prior to Admission medications   Medication Sig Start Date End Date Taking? Authorizing Provider  ALPRAZolam Duanne Moron) 1 MG tablet Take 1 mg by mouth at bedtime.    [provider]  apixaban (ELIQUIS) 5 MG TABS tablet Take 5 mg by mouth 2 (two) times daily.    [provider]  atenolol (TENORMIN) 50 MG tablet Take 50 mg by mouth daily.    [provider]  carbidopa-levodopa (SINEMET IR) 25-100 MG tablet carbidopa 25 mg-levodopa 100 mg tablet  TAKE 1 TABLET IN THE MORNING, 1 TABLET AT NOON, AND 1/2 TABLET INN THE EVENING 11/11/19   [provider]  cephALEXin  (KEFLEX) 500 MG capsule Take 1 capsule (500 mg total) by mouth 3 (three) times daily. 11/28/21   Comer, Okey Regal, MD  citalopram (CELEXA) 40 MG tablet Take 40 mg by mouth every evening.    [provider]  clonazePAM (KLONOPIN) 0.5 MG tablet clonazepam 0.5 mg tablet  TAKE ONE TABLET (0.5 MG DOSE) BY MOUTH 2 (TWO) TIMES A DAY AS NEEDED FOR ANXIETY.    [provider]  diclofenac Sodium (VOLTAREN) 1 % GEL diclofenac 1 % topical gel  PLACE TWO G TO FOUR G ONTO THE SKIN 4 (FOUR) TIMES A DAY AS NEEDED FOR UP TO 30 DAYS.    [provider]  docusate sodium (COLACE) 100 MG capsule Take 1 capsule (100 mg total) by mouth 2 (two) times daily. 12/17/18   Danae Orleans, PA-C  DULoxetine (CYMBALTA) 60 MG capsule Take 1 a day 12/07/19   [provider]  ferrous sulfate (FERROUSUL) 325 (65 FE) MG tablet Take 1 tablet (325 mg total) by mouth 3 (three) times daily with meals. 12/17/18   Danae Orleans, PA-C  furosemide (LASIX) 40 MG tablet Take 40 mg by mouth daily.    [provider]  gabapentin (NEURONTIN) 600 MG tablet Take 1,200 mg by mouth at bedtime.     [provider]  hydrOXYzine (VISTARIL) 25 MG capsule Take by mouth. 11/08/20   [provider]  levothyroxine (SYNTHROID, LEVOTHROID) 112 MCG tablet Take 112 mcg by mouth every evening.     [provider]  loperamide (IMODIUM) 2 MG capsule Take 1 capsule (2 mg total) by mouth as needed for diarrhea or loose stools. 01/18/19   Thayer Headings, MD  Melatonin 10 MG TABS Take 20 mg by mouth at bedtime.    [provider]  OLANZapine (ZYPREXA) 5 MG tablet Take 5 mg by mouth at bedtime. 10/08/20   [provider]  ondansetron (ZOFRAN) 4 MG tablet Take 4 mg by mouth every morning. 10/16/20   [provider]  polyethylene glycol (MIRALAX / GLYCOLAX) 17 g packet Take 17 g by mouth 2 (two) times daily. 12/17/18   Danae Orleans, PA-C  potassium chloride (K-DUR,KLOR-CON) 10  MEQ tablet Take 10 mEq by mouth daily as needed (poatassium).     [provider]  rOPINIRole (REQUIP) 2 MG tablet Take 2 mg by mouth 4 (four) times daily.     [provider]  traZODone (DESYREL) 150 MG tablet Take 150 mg by mouth at bedtime as needed. 10/20/20   [provider]      Allergies    Nickel, Other, Hydroxyzine, Amiodarone, Benadryl [diphenhydramine hcl], Metoprolol, Septra [sulfamethoxazole-trimethoprim], Statins, and Doxycycline    Review of Systems   Review of Systems  All other systems reviewed and are negative.   Physical Exam Updated Vital Signs BP 108/81   Pulse 94   Temp (!) 97.4 F (36.3 C) (Oral)   Resp (!) 22   Ht 5' (1.524 m)   Wt 77.6 kg   SpO2 97%   BMI 33.41 kg/m  Physical Exam Vitals and nursing note reviewed.  Constitutional:      Appearance: She is well-developed.  HENT:     Head: Normocephalic and atraumatic.  Cardiovascular:     Rate and Rhythm: Tachycardia present. Rhythm irregular.     Heart sounds: No murmur heard. Pulmonary:     Effort: Respiratory distress present.     Comments: Wheezes bilaterally with crackles in bilateral bases - right greater than left Abdominal:     Palpations: Abdomen is soft.     Tenderness: There is no abdominal tenderness. There is no guarding or rebound.  Musculoskeletal:     Comments: Pitting edema to LLE.  2+ DP pulses bilaterally.  TTP to left tibfib, left knee with pain with ROM.  There is fullness to the left popliteal fossa  Skin:    General: Skin is warm and dry.  Neurological:     Mental Status: She is alert and oriented to person, place, and time.  Psychiatric:        Behavior: Behavior normal.     ED Results / Procedures / Treatments   Labs (all labs ordered are listed, but only abnormal results are displayed) Labs Reviewed  COMPREHENSIVE METABOLIC PANEL - Abnormal; Notable for the following components:      Result Value   Glucose, Bld 138 (*)    Creatinine,  Ser 1.13 (*)    Calcium 8.3 (*)    Total Protein 6.4 (*)    GFR, Estimated 50 (*)    All other components within normal limits  BRAIN NATRIURETIC PEPTIDE - Abnormal; Notable for the following components:   B Natriuretic Peptide 316.8 (*)    All other components within normal limits  CBC WITH DIFFERENTIAL/PLATELET - Abnormal; Notable for the following components:   WBC 13.0 (*)    RBC 3.63 (*)  Hemoglobin 10.9 (*)    HCT 35.9 (*)    Neutro Abs 10.7 (*)    All other components within normal limits  URINALYSIS, ROUTINE W REFLEX MICROSCOPIC - Abnormal; Notable for the following components:   APPearance CLOUDY (*)    Ketones, ur 5 (*)    Leukocytes,Ua MODERATE (*)    All other components within normal limits  BLOOD GAS, VENOUS - Abnormal; Notable for the following components:   pO2, Ven 70 (*)    Bicarbonate 31.9 (*)    Acid-Base Excess 5.7 (*)    All other components within normal limits  RESP PANEL BY RT-PCR (FLU A&B, COVID) ARPGX2  MAGNESIUM  I-STAT CHEM 8, ED  TROPONIN I (HIGH SENSITIVITY)  TROPONIN I (HIGH SENSITIVITY)    EKG EKG Interpretation  Date/Time:  Sunday July 07 2022 03:33:09 EST Ventricular Rate:  66 PR Interval:    QRS Duration: 88 QT Interval:  496 QTC Calculation: 520 R Axis:   12 Text Interpretation: Atrial flutter with predominant 4:1 AV block Low voltage, precordial leads Borderline T abnormalities, diffuse leads Prolonged QT interval Confirmed by Tilden Fossa [phone]) on 07/07/2022 4:20:28 AM  Radiology CT Angio Chest PE W/Cm &/Or Wo Cm  Result Date: 07/07/2022 CLINICAL DATA:  76 year old female with history of knee pain. Clinical suspicion for pulmonary embolism. EXAM: CT ANGIOGRAPHY CHEST WITH CONTRAST TECHNIQUE: Multidetector CT imaging of the chest was performed using the standard protocol during bolus administration of intravenous contrast. Multiplanar CT image reconstructions and MIPs were obtained to evaluate the vascular anatomy.  RADIATION DOSE REDUCTION: This exam was performed according to the departmental dose-optimization program which includes automated exposure control, adjustment of the mA and/or kV according to patient size and/or use of iterative reconstruction technique. CONTRAST:  OMNIPAQUE IOHEXOL 350 MG/ML SOLN COMPARISON:  No priors. FINDINGS: Cardiovascular: Study is severely limited by extensive patient respiratory motion. With these limitations in mind, there is no central, lobar or proximal segmental sized pulmonary embolism. Distal segmental and subsegmental sized emboli can not be entirely excluded on the basis of today's examination. Heart size is enlarged with left atrial dilatation. There is no significant pericardial fluid, thickening or pericardial calcification. Atherosclerotic calcifications in the thoracic aorta. No definite coronary artery calcifications. Mediastinum/Nodes: Multiple prominent borderline enlarged and mildly enlarged mediastinal and bilateral hilar lymph nodes are noted, measuring up to 1.8 cm in short axis in the subcarinal nodal station, 1.5 cm in short axis in the low right paratracheal nodal station, and 1.3 cm in short axis in the prevascular nodal station. Esophagus is unremarkable in appearance. No axillary lymphadenopathy. Lungs/Pleura: Evaluation of the lung parenchyma is severely limited by extensive patient respiratory motion. With these limitations in mind there is widespread ground-glass attenuation, septal thickening and nodular appearing areas of airspace consolidation scattered throughout all aspects of the lungs bilaterally. No pleural effusions. No pneumothorax. Upper Abdomen: Aortic atherosclerosis. Musculoskeletal: Orthopedic fixation hardware in the lower cervical spine incidentally noted. Small sclerotic lesion with narrow zone of transition in the anterolateral aspect of the right fifth rib (axial image 74 of series 4), favored to represent a tiny bone island. There are  no other aggressive appearing lytic or blastic lesions noted in the visualized portions of the skeleton. Review of the MIP images confirms the above findings. IMPRESSION: 1. Severely limited examination secondary to patient respiratory motion. No definite central, lobar or proximal segmental sized pulmonary emboli. 2. The appearance of the lungs is concerning for severe multilobar bilateral bronchopneumonia. Given the  presence of cardiomegaly, the possibility of a background of mild interstitial pulmonary edema is also suspected. 3. Mediastinal and hilar lymphadenopathy, presumably reactive in the setting of acute infection and congestive heart failure. However, repeat noncontrast chest CT is recommended in 2 months to ensure regression of these findings after resolution of the patient's acute illness, to exclude underlying malignancy. 4. Aortic atherosclerosis. Aortic Atherosclerosis (ICD10-I70.0). Electronically Signed   By: Vinnie Langton M.D.   On: 07/07/2022 07:57   DG Tibia/Fibula Left  Result Date: 07/07/2022 CLINICAL DATA:  76 year old female with history of shortness of breath. Fall. Left leg pain. EXAM: LEFT TIBIA AND FIBULA - 2 VIEW COMPARISON:  Left knee radiographs 07/07/2022. CT of the left knee 11/13/2016. FINDINGS: Multiple views of the left tibia and fibula demonstrate postoperative changes of left total knee arthroplasty. Cerclage wires are noted adjacent to the distal femoral diaphysis. Lucency along the medial femoral condyle just lateral to the femoral prosthesis, potentially reflecting loosening of the hardware (although no prior studies are available for comparison following implantation of the hardware). Tibia and fibula appear intact without definite acute displaced fracture. There is a small amount of lucency adjacent to the distal aspect of the tibial component of the prosthesis, which may suggest some mild loosening of the hardware in this region as well. In addition, there is loss  of bone in the medial tibial plateau, which could be chronic based on comparison with remote prior CT examination. Mild diffuse soft tissue swelling. IMPRESSION: 1. Status post left total knee arthroplasty, with lucency adjacent to both the tibial and femoral components of the prosthesis, as above. Based on comparison with remote prior CT examination (before the implantation of the knee prosthesis) these areas of lucency adjacent to the knee joint may be chronic, although there is some lucency adjacent to the distal aspect of the tibial stem, suggesting some degree of motion of the prosthesis. 2. Negative for acute fracture. Electronically Signed   By: Vinnie Langton M.D.   On: 07/07/2022 05:28   DG Chest Port 1 View  Result Date: 07/07/2022 CLINICAL DATA:  Dyspnea EXAM: PORTABLE CHEST 1 VIEW COMPARISON:  12/26/2018 FINDINGS: Stable cardiomegaly. Stable mediastinal contours accounting for leftward rotation. Coarsening of lung markings which is chronic and generalized. Indistinct density over the left diaphragm with poorly visualized lateral diaphragm. No visible effusion or pneumothorax. ACDF hardware. IMPRESSION: Low volume chest accentuating chronic interstitial opacity. Poorly defined left diaphragm which could be from atelectasis or pneumonia. Electronically Signed   By: Jorje Guild M.D.   On: 07/07/2022 05:24   DG Knee Complete 4 Views Left  Result Date: 07/07/2022 CLINICAL DATA:  Left posterior knee pain.  Fall. EXAM: LEFT KNEE - COMPLETE 4+ VIEW COMPARISON:  None Available. FINDINGS: Changes of left knee replacement. No acute fracture, subluxation or dislocation. Lucency around the distal femoral component could reflect loosening. No joint effusion. IMPRESSION: No acute posttraumatic bone abnormality. Lucency around the distal femoral component of the left knee replacement suggesting loosening. Electronically Signed   By: Rolm Baptise M.D.   On: 07/07/2022 00:40    Procedures Procedures     Medications Ordered in ED Medications  cefTRIAXone (ROCEPHIN) 1 g in sodium chloride 0.9 % 100 mL IVPB (has no administration in time range)  acetaminophen (TYLENOL) tablet 650 mg (has no administration in time range)    Or  acetaminophen (TYLENOL) suppository 650 mg (has no administration in time range)  apixaban (ELIQUIS) tablet 5 mg (has no administration in time  range)  ipratropium-albuterol (DUONEB) 0.5-2.5 (3) MG/3ML nebulizer solution 3 mL (3 mLs Nebulization Given 07/07/22 0427)  furosemide (LASIX) injection 40 mg (40 mg Intravenous Given 07/07/22 0421)  azithromycin (ZITHROMAX) 500 mg in sodium chloride 0.9 % 250 mL IVPB (500 mg Intravenous New Bag/Given 07/07/22 0710)  iohexol (OMNIPAQUE) 350 MG/ML injection 100 mL (100 mLs Intravenous Contrast Given 07/07/22 [id number])    ED Course/ Medical Decision Making/ A&P                           Medical Decision Making Amount and/or Complexity of Data Reviewed Labs: ordered. Radiology: ordered.  Risk Prescription drug management. Decision regarding hospitalization.   Patient with history of CHF, A-fib here for evaluation of knee pain following a fall, noticeably short of breath at time of ED evaluation and family states that she has seemed short of breath for the last 1 to 2 days.  On initial evaluation she has crackles, wheezes and new oxygen requirement of 2 L.  Chest x-ray with pulmonary edema versus infiltrate-started on antibiotics for community-acquired pneumonia as well as furosemide for diuresis.  CBC with mild leukocytosis, BNP is mildly elevated.  In terms of her leg pain-no evidence of acute fracture or hardware failure on her images, images personally reviewed and agreed and interpreted with radiologist interpretation.  Given her pain discussed with Dr. Alvan Dame with orthopedics-we will review her images.  Given her shortness of breath, increased oxygen requirement she will require admission for ongoing care.  Patient and  daughter updated findings of studies and plan for admission and they are in agreement with treatment plan.  Hospitalist consulted for admission.        Final Clinical Impression(s) / ED Diagnoses Final diagnoses:  Community acquired pneumonia, unspecified laterality  Fall, initial encounter  Left knee pain, unspecified chronicity  Acute hypoxic respiratory failure [employer]    Rx / DC Orders ED Discharge Orders     None         Quintella Reichert, MD 07/07/22 [phone]

## 2022-07-07 NOTE — H&P (Signed)
History and Physical    Patient: [name] [id number] DOB: [birth year] DOA: 07/06/2022 DOS: the patient was seen and examined on 07/07/2022 PCP: Kathreen Devoid, PA-C  Patient coming from: Home  Chief Complaint:  Chief Complaint  Patient presents with   Knee Pain   HPI: LACOSTA AX is a 76 y.o. female with medical history significant of paroxysmal atrial fibrillation, anxiety, depression, osteoarthritis, chronic diastolic CHF, dyspnea, GERD, hiatal hernia, history of headaches, hyperlipidemia, hypertension, hypothyroidism, legally blind, history of pneumonia, pulmonary hypertension, restless leg syndrome, retinitis pigmentosa who is coming to the emergency department due to falling on her left knee twice earlier in the day.  She has also been dyspneic for the past 2 days.  The patient's daughters stated that she has been hallucinating and not making sense sometimes with her conversations.  She recently was paranoid and delusional about her husband being in front of the house talking to another woman.  She is unable to provide a coherent HPI.  She is able to answer simple questions.  She is having left knee pain, but no headache, chest or abdominal pain at this time.  ED course: Initial vital signs were temperature 97.8 F, pulse 88, respiration 18, BP 107/72 mmHg O2 sat 93% on room air.  Lab work: Urinalysis was cloudy with ketonuria 5 mg/dL and moderate leukocyte esterase.  Coronavirus influenza PCR negative.  Venous blood gas with normal pH and PCO2.  PO2 was 70 mmHg, bicarbonate 31.9 and acid-base excess 5.7 mmol/L.  CBC with a white count of 13.0 and 81% neutrophils, hemoglobin 10.9 g/dL platelets 222.  Troponin x2 negative.  BNP was 317 pg/mL.  CMP showed a glucose of 138, creatinine 1.13 and calcium 8.3 mg/dL.  Total protein 6.4 g/dL.  The rest of the CMP values were unremarkable.  Imaging: Portable 1 view chest radiograph lobe volume distress accentuating chron  interstitial opacity.  Poorly defined left diaphragm which could be from atelectasis or pneumonia.  CTA chest with no definite PE but he was severely limited due to respiratory motion from the patient.  There appears to be severe multilobar bilateral bronchopneumonia.  There is cardiomegaly and the possibility of a background mild interstitial pulmonary edema suspected.  Mediastinal and hilar lymphadenopathy presumably reactive.  Chest CT recommended in 2 months to ensure resolution.  There is aortic atherosclerosis.  Left knee the x-ray no acute posttraumatic bone abnormality.  There is lucency around the distal femoral component of the left knee replacement suggesting loosening.  DVI and fibula x-ray also showing lucency adjacent to both the tibial and femoral components of the prostheses.  Based on previous CT examination of the knee (these areas appear to be chronic.  Negative for acute fracture.  Please see images and full regular report for further details.   Review of Systems: As mentioned in the history of present illness. All other systems reviewed and are negative. Past Medical History:  Diagnosis Date   A-fib ([place])    PAROXYSMAL  AFIB ON ELIQUIS , MGD BY CARDIO DR Gerarda Gunther AT [facility]    Anxiety    Arthritis    CHF (congestive heart failure) (HCC)    diastolic Hf   Dyspnea    at times   Dysrhythmia    a fib   Family history of adverse reaction to anesthesia    Daughter hard to wake up   GERD (gastroesophageal reflux disease)    Headache    hx of   History of hiatal  hernia    History of kidney stones    Hyperlipidemia    Hypertension    Hypothyroidism    Legally blind    per patient ,patient reports she can only see a blur of images;  needs visual guidance for day to day activities ;    Palpitations    Pneumonia    Pulmonary HTN (HCC)    moderate, see ECHO care everywhere ; patient denies any increases repiratory effort today    Restless leg syndrome    Retinitis pigmentosa     Past Surgical History:  Procedure Laterality Date   EXCISIONAL TOTAL KNEE ARTHROPLASTY WITH ANTIBIOTIC SPACERS Left 11/11/2016   Procedure: Left total knee arthroplasty resection and placement of antibiotic spacer;  Surgeon: Paralee Cancel, MD;  Location: [facility];  Service: Orthopedics;  Laterality: Left;  Adductor Block   EYE SURGERY     cataract extraction   I & D KNEE WITH POLY EXCHANGE Left 12/15/2018   Procedure: IRRIGATION AND DEBRIDEMENT left total knee with poly exchange;  Surgeon: Paralee Cancel, MD;  Location: [facility];  Service: Orthopedics;  Laterality: Left;  85min   KNEE ARTHROPLASTY Left 08/1997   done [facility] in [place], [place]   knee arthroplasty Left    x4 revision d/t infection and failed initial arthroplasty   NECK SURGERY  2016   ORIF WRIST FRACTURE  09/2018   METAL IN PLACE    Reimplantatio of Left total knee     03/17/17 Dr. Alvan Dame   REIMPLANTATION OF TOTAL KNEE Left 03/17/2017   Procedure: REIMPLANTATION OF LEFT TOTAL KNEE Arthroplasty;  Surgeon: Paralee Cancel, MD;  Location: [facility];  Service: Orthopedics;  Laterality: Left;   Social History:  reports that she has quit smoking. Her smoking use included cigarettes. She has never used smokeless tobacco. She reports that she does not drink alcohol and does not use drugs.  Allergies  Allergen Reactions   Nickel Rash   Other Rash and Other (See Comments)    Metal   Hydroxyzine Rash   Amiodarone Other (See Comments)    Restless legs   Benadryl [Diphenhydramine Hcl] Hives   Metoprolol Other (See Comments)    Restless legs   Septra [Sulfamethoxazole-Trimethoprim] Hives   Statins Nausea And Vomiting    Makes her sick on stomach   Doxycycline Diarrhea    History reviewed. No pertinent family history.  Prior to Admission medications   Medication Sig Start Date End Date Taking? Authorizing Provider  ALPRAZolam Duanne Moron) 1 MG tablet Take 1 mg by mouth at bedtime.    [provider]  apixaban (ELIQUIS) 5 MG TABS  tablet Take 5 mg by mouth 2 (two) times daily.    [provider]  atenolol (TENORMIN) 50 MG tablet Take 50 mg by mouth daily.    [provider]  carbidopa-levodopa (SINEMET IR) 25-100 MG tablet carbidopa 25 mg-levodopa 100 mg tablet  TAKE 1 TABLET IN THE MORNING, 1 TABLET AT NOON, AND 1/2 TABLET INN THE EVENING 11/11/19   [provider]  cephALEXin (KEFLEX) 500 MG capsule Take 1 capsule (500 mg total) by mouth 3 (three) times daily. 11/28/21   Comer, Okey Regal, MD  citalopram (CELEXA) 40 MG tablet Take 40 mg by mouth every evening.    [provider]  clonazePAM (KLONOPIN) 0.5 MG tablet clonazepam 0.5 mg tablet  TAKE ONE TABLET (0.5 MG DOSE) BY MOUTH 2 (TWO) TIMES A DAY AS NEEDED FOR ANXIETY.    [provider]  diclofenac Sodium (VOLTAREN) 1 % GEL diclofenac 1 % topical gel  PLACE TWO G TO FOUR G ONTO THE SKIN 4 (FOUR) TIMES A DAY AS NEEDED FOR UP TO 30 DAYS.    [provider]  docusate sodium (COLACE) 100 MG capsule Take 1 capsule (100 mg total) by mouth 2 (two) times daily. 12/17/18   Danae Orleans, PA-C  DULoxetine (CYMBALTA) 60 MG capsule Take 1 a day 12/07/19   [provider]  ferrous sulfate (FERROUSUL) 325 (65 FE) MG tablet Take 1 tablet (325 mg total) by mouth 3 (three) times daily with meals. 12/17/18   Danae Orleans, PA-C  furosemide (LASIX) 40 MG tablet Take 40 mg by mouth daily.    [provider]  gabapentin (NEURONTIN) 600 MG tablet Take 1,200 mg by mouth at bedtime.     [provider]  hydrOXYzine (VISTARIL) 25 MG capsule Take by mouth. 11/08/20   [provider]  levothyroxine (SYNTHROID, LEVOTHROID) 112 MCG tablet Take 112 mcg by mouth every evening.     [provider]  loperamide (IMODIUM) 2 MG capsule Take 1 capsule (2 mg total) by mouth as needed for diarrhea or loose stools. 01/18/19   Thayer Headings, MD  Melatonin 10 MG TABS Take 20 mg by mouth at bedtime.    [provider]  OLANZapine (ZYPREXA) 5 MG tablet Take 5 mg by mouth at bedtime. 10/08/20   [provider]  ondansetron (ZOFRAN) 4 MG tablet Take 4 mg by mouth every morning. 10/16/20   [provider]  polyethylene glycol (MIRALAX / GLYCOLAX) 17 g packet Take 17 g by mouth 2 (two) times daily. 12/17/18   Danae Orleans, PA-C  potassium chloride (K-DUR,KLOR-CON) 10 MEQ tablet Take 10 mEq by mouth daily as needed (poatassium).     [provider]  rOPINIRole (REQUIP) 2 MG tablet Take 2 mg by mouth 4 (four) times daily.     [provider]  traZODone (DESYREL) 150 MG tablet Take 150 mg by mouth at bedtime as needed. 10/20/20   [provider]    Physical Exam: Vitals:   07/07/22 0700 07/07/22 0715 07/07/22 0730 07/07/22 0800  BP:      Pulse: 90 80 98 94  Resp: (!) 21 18 (!) 27 (!) 22  Temp:      TempSrc:      SpO2: 99% 97% 98% 97%  Weight:      Height:       Physical Exam Vitals and nursing note reviewed.  Constitutional:      General: She is awake. She is not in acute distress.    Appearance: She is obese.  HENT:     Head: Normocephalic.     Nose: No rhinorrhea.     Mouth/Throat:     Mouth: Mucous membranes are moist.  Eyes:     General: No scleral icterus.    Pupils: Pupils are equal, round, and reactive to light.  Neck:     Vascular: No JVD.  Cardiovascular:     Rate and Rhythm: Normal rate and regular rhythm.     Heart sounds: S1 normal and S2 normal.  Pulmonary:     Effort: Pulmonary effort is normal.     Breath sounds: Normal breath sounds. No wheezing, rhonchi or rales.  Abdominal:     General: Bowel sounds are normal. There is no distension.     Palpations: Abdomen is soft.     Tenderness: There is no abdominal  tenderness. There is no guarding.  Musculoskeletal:     Cervical back: Neck supple.     Left knee: Swelling present. Decreased range of motion. Tenderness present.     Right lower leg: No edema.     Left lower  leg: No edema.  Skin:    General: Skin is warm and dry.  Neurological:     General: No focal deficit present.     Mental Status: She is alert and oriented to person, place, and time.  Psychiatric:        Attention and Perception: She is inattentive.        Mood and Affect: Mood is anxious.        Behavior: Behavior is agitated. Behavior is cooperative.        Thought Content: Thought content is paranoid and delusional.        Cognition and Memory: Cognition is impaired. Memory is impaired.     Data Reviewed:  There are no new results to review at this time.  Assessment and Plan: Principal Problem:   Acute respiratory failure with hypoxia (HCC) In the setting of:   CAP (community acquired pneumonia)  Admit to PCU/inpatient. Continue supplemental oxygen. Scheduled and as needed bronchodilators. Continue ceftriaxone 1 g IVPB daily. Switch azithromycin to doxycycline due to QT prolongation. Check strep pneumoniae urinary antigen. Check sputum Gram stain, culture and sensitivity. Follow-up blood culture and sensitivity. Follow-up CBC and chemistry in the morning.  Active Problems:   Prolonged QT interval Hold trazodone and Seroquel. Avoid QT prolonging meds. Keep electrolytes optimized.    Left knee pain Dr. Madilyn Hook, Dr. Charlann Boxer will be reviewing the films. Full consult to follow.    PAF (paroxysmal atrial fibrillation) (HCC) Continue amiodarone and apixaban.    Essential hypertension Continue furosemide 40 mg p.o. daily.  . Continue metoprolol 25 mg p.o. daily.    Paranoid schizophrenia (HCC)   Hallucinations Consult behavioral health. Check B12 and TSH level. Hold trazodone/quetiapine due to prolonged QT.    RLS (restless legs syndrome) Restarted Requip.    Hypothyroidism Check TSH. Continue levothyroxine 88 mcg p.o. daily.    Tardive dyskinesia Continue Cogentin at bedtime.      Advance Care Planning:   Code Status: Full Code   Consults:   Family  Communication:   Severity of Illness: The appropriate patient status for this patient is INPATIENT. Inpatient status is judged to be reasonable and necessary in order to provide the required intensity of service to ensure the patient's safety. The patient's presenting symptoms, physical exam findings, and initial radiographic and laboratory data in the context of their chronic comorbidities is felt to place them at high risk for further clinical deterioration. Furthermore, it is not anticipated that the patient will be medically stable for discharge from the hospital within 2 midnights of admission.   * I certify that at the point of admission it is my clinical judgment that the patient will require inpatient hospital care spanning beyond 2 midnights from the point of admission due to high intensity of service, high risk for further deterioration and high frequency of surveillance required.*  Author: Bobette Mo, MD 07/07/2022 9:31 AM  For on call review [email or web address].   This document was prepared using Dragon voice recognition software and may contain some unintended transcription errors.

## 2022-07-07 NOTE — Progress Notes (Signed)
No need of bipap at this time.  

## 2022-07-07 NOTE — Progress Notes (Signed)
The patient complained of restless leg and anxiety. Atarax given at 1737. The pt feels that medication has not helped. She continues to be restless and tearful. She is alert and oriented to self and place. Provider paged for assistance.

## 2022-07-07 NOTE — ED Notes (Addendum)
Page sent to Dr. Shon Baton, [facility] [phone] per EDP order. [employer]

## 2022-07-08 DIAGNOSIS — W19XXXA Unspecified fall, initial encounter: Secondary | ICD-10-CM

## 2022-07-08 DIAGNOSIS — J9601 Acute respiratory failure with hypoxia: Secondary | ICD-10-CM | POA: Diagnosis not present

## 2022-07-08 DIAGNOSIS — J189 Pneumonia, unspecified organism: Secondary | ICD-10-CM

## 2022-07-08 LAB — TSH: TSH: 0.919 u[IU]/mL (ref 0.350–4.500)

## 2022-07-08 LAB — RPR: RPR Ser Ql: NONREACTIVE

## 2022-07-08 LAB — STREP PNEUMONIAE URINARY ANTIGEN: Strep Pneumo Urinary Antigen: NEGATIVE

## 2022-07-08 LAB — VITAMIN B12: Vitamin B-12: 223 pg/mL (ref 180–914)

## 2022-07-08 MED ORDER — ORAL CARE MOUTH RINSE: 15.0000 mL | OROMUCOSAL | Status: DC | PRN

## 2022-07-08 MED ORDER — ORAL CARE MOUTH RINSE
15.0000 mL | OROMUCOSAL | Status: DC
  Administered 2022-07-08 – 2022-07-15 (×22): 15 mL via OROMUCOSAL

## 2022-07-08 MED ORDER — OXYCODONE-ACETAMINOPHEN 10-325 MG PO TABS: 1.0000 | ORAL_TABLET | Freq: Three times a day (TID) | ORAL | Status: DC | PRN

## 2022-07-08 MED ORDER — MELATONIN 5 MG PO TABS
5.0000 mg | ORAL_TABLET | Freq: Once | ORAL | Status: AC
  Administered 2022-07-08: 5 mg via ORAL
  Filled 2022-07-08: qty 1

## 2022-07-08 MED ORDER — OXYCODONE HCL 5 MG PO TABS
5.0000 mg | ORAL_TABLET | Freq: Three times a day (TID) | ORAL | Status: DC | PRN
  Administered 2022-07-10 – 2022-07-15 (×7): 5 mg via ORAL
  Filled 2022-07-08 (×7): qty 1

## 2022-07-08 MED ORDER — OXYCODONE-ACETAMINOPHEN 5-325 MG PO TABS
1.0000 | ORAL_TABLET | Freq: Three times a day (TID) | ORAL | Status: DC | PRN
  Administered 2022-07-08 – 2022-07-15 (×11): 1 via ORAL
  Filled 2022-07-08 (×12): qty 1

## 2022-07-08 NOTE — Hospital Course (Addendum)
[name] is a 76 y.o. female with a history of paroxysmal atrial fibrillation on Eliquis, major depression with psychotic features.,  Delusional disorder, dizziness hallucinations, diastolic heart failure, hypertension, hypothyroidism, retinitis pigmentosa, tardive dyskinesia.  Patient presented secondary to a fall with reports of worsening visual hallucinations.  Psychiatry consulted and medications have been adjusted.  And also with commune acquired pneumonia which responded well to antibiotic therapy.  Respiratory failure resolved with treatment of pneumonia.  Recommendation for inpatient psychiatry evaluation/admission however patient improved with change in therapy. Psychiatry recommendations to discharge home on Zyprexa 5 mg and Ingrezza 60 mg daily in addition to outpatient psychiatry follow-up.
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
## 2022-07-08 NOTE — Progress Notes (Signed)
Progress Note   Patient: [name] AOZ:[id number] DOB: [birth year] DOA: 07/06/2022     1 DOS: the patient was seen and examined on 07/08/2022   Brief hospital course: 76 y.o. female with medical history significant of paroxysmal atrial fibrillation, anxiety, depression, osteoarthritis, chronic diastolic CHF, dyspnea, GERD, hiatal hernia, history of headaches, hyperlipidemia, hypertension, hypothyroidism, legally blind, history of pneumonia, pulmonary hypertension, restless leg syndrome, retinitis pigmentosa who is coming to the emergency department due to falling on her left knee twice earlier in the day.  She has also been dyspneic for the past 2 days.  The patient's daughters stated that she has been hallucinating and not making sense sometimes with her conversations.  She recently was paranoid and delusional about her husband being in front of the house talking to another woman.  She is unable to provide a coherent HPI.  She is able to answer simple questions.  She is having left knee pain, but no headache, chest or abdominal pain at this time.   Assessment and Plan: Principal Problem:   Acute respiratory failure with hypoxia (HCC) In the setting of:   CAP (community acquired pneumonia)  -Continue supplemental oxygen, later weaned to room air -continue bronchodilators as needed -Continue ceftriaxone and doxycycline due to QT prolongation. -strep pneumoniae urinary antigen neg -recheck cbc in AM   Active Problems:   Prolonged QT interval -Held trazodone and Seroquel. Avoid QT prolonging meds. Keep electrolytes optimized.     Left knee pain EDP discussed with Dr. Charlann Boxer F/u with Orthopedic Surgery input     PAF (paroxysmal atrial fibrillation) (HCC) Continue amiodarone and apixaban. -Currently rate controlled     Essential hypertension -Continue furosemide 40 mg p.o. daily.  . -Continue metoprolol 25 mg p.o. daily.     Paranoid schizophrenia (HCC)    Hallucinations Consulted behavioral health. Check B12 223 TSH 0.919 Hold trazodone/quetiapine due to prolonged QT. Appreciate input by Psychiatry. Recommendation to continue current psych meds Recs for Inpatient Psych. Psychiatry to reassess tomorrow if pt is interested in voluntarily going for inpt treatment     RLS (restless legs syndrome) Hold off on requip for now  Recent outpt records per Neurology on 11/15 recommended discontinuing "Requip and initiating the cogentin to treat the RLS/possible akithisia from olanzapine"     Hypothyroidism Check TSH 0.919 Continue levothyroxine 88 mcg p.o. daily.     Tardive dyskinesia Continue Cogentin at bedtime.      Subjective: Complaining of L knee discomfort and swelling  Physical Exam: Vitals:   07/08/22 0537 07/08/22 0840 07/08/22 0931 07/08/22 1257  BP: 119/72   116/61  Pulse: 88   96  Resp: 18   (!) 21  Temp: 98.5 F (36.9 C)   98.2 F (36.8 C)  TempSrc:    Oral  SpO2: 98% 96% 99% 95%  Weight:      Height:       General exam: Awake, laying in bed, in nad Respiratory system: Normal respiratory effort, no wheezing Cardiovascular system: regular rate, s1, s2 Gastrointestinal system: Soft, nondistended, positive BS Central nervous system: CN2-12 grossly intact, strength intact Extremities: Perfused, no clubbing Skin: Normal skin turgor, no notable skin lesions seen Psychiatry: Mood normal // no visual hallucinations   Data Reviewed:  There are no new results to review at this time.  Family Communication: Pt in room, family not at bedside  Disposition: Status is: Inpatient Remains inpatient appropriate because: Severity of illness  Planned Discharge Destination:  Inpatient psych  Author: Rickey Barbara, MD 07/08/2022 7:28 PM  For on call review [email or web address].

## 2022-07-08 NOTE — TOC Progression Note (Addendum)
Transition of Care [facility]) - Progression Note    Patient Details  Name: [name] MRN: [id number] Date of Birth: May 09, 1946  [facility]) CM/SW Contact  Geni Bers, RN Phone Number: 07/08/2022, 10:21 AM  Clinical Narrative:     Spoke with pt concerning discharge plans. Pt states that someone came into her room, said that she was going to a place for 6 days to a psych facility. Spoke with pt's RN and looked for notes. Not sure of what pt is stating, no notes on this documented. In the meantime pt did agreed to [facility] if she did not go to a facility. RN and TOC will continue to follow up on pt's statement.  Expected Discharge Plan: Home w Home Health Services Barriers to Discharge: No Barriers Identified  Expected Discharge Plan and Services Expected Discharge Plan: Home w Home Health Services       Living arrangements for the past 2 months: Single Family Home                                       Social Determinants of Health (SDOH) Interventions    Readmission Risk Interventions     No data to display

## 2022-07-09 DIAGNOSIS — J9601 Acute respiratory failure with hypoxia: Secondary | ICD-10-CM | POA: Diagnosis not present

## 2022-07-09 DIAGNOSIS — W19XXXA Unspecified fall, initial encounter: Secondary | ICD-10-CM | POA: Diagnosis not present

## 2022-07-09 DIAGNOSIS — J189 Pneumonia, unspecified organism: Secondary | ICD-10-CM | POA: Diagnosis not present

## 2022-07-09 LAB — CBC
HCT: 34.7 % — ABNORMAL LOW (ref 36.0–46.0)
Hemoglobin: 10.8 g/dL — ABNORMAL LOW (ref 12.0–15.0)
MCH: 30 pg (ref 26.0–34.0)
MCHC: 31.1 g/dL (ref 30.0–36.0)
MCV: 96.4 fL (ref 80.0–100.0)
Platelets: 237 10*3/uL (ref 150–400)
RBC: 3.6 MIL/uL — ABNORMAL LOW (ref 3.87–5.11)
RDW: 15.2 % (ref 11.5–15.5)
WBC: 11.1 10*3/uL — ABNORMAL HIGH (ref 4.0–10.5)
nRBC: 0 % (ref 0.0–0.2)

## 2022-07-09 LAB — COMPREHENSIVE METABOLIC PANEL
ALT: 8 U/L (ref 0–44)
AST: 17 U/L (ref 15–41)
Albumin: 3.2 g/dL — ABNORMAL LOW (ref 3.5–5.0)
Alkaline Phosphatase: 77 U/L (ref 38–126)
Anion gap: 7 (ref 5–15)
BUN: 15 mg/dL (ref 8–23)
CO2: 24 mmol/L (ref 22–32)
Calcium: 8.3 mg/dL — ABNORMAL LOW (ref 8.9–10.3)
Chloride: 105 mmol/L (ref 98–111)
Creatinine, Ser: 0.87 mg/dL (ref 0.44–1.00)
GFR, Estimated: >60 mL/min (ref >60)
Glucose, Bld: 130 mg/dL — ABNORMAL HIGH (ref 70–99)
Potassium: 4.3 mmol/L (ref 3.5–5.1)
Sodium: 136 mmol/L (ref 135–145)
Total Bilirubin: 0.7 mg/dL (ref 0.3–1.2)
Total Protein: 6.2 g/dL — ABNORMAL LOW (ref 6.5–8.1)

## 2022-07-09 LAB — HIV ANTIBODY (ROUTINE TESTING W REFLEX): HIV Screen 4th Generation wRfx: NONREACTIVE

## 2022-07-09 LAB — MAGNESIUM: Magnesium: 2.4 mg/dL (ref 1.7–2.4)

## 2022-07-09 MED ORDER — MELATONIN 5 MG PO TABS
ORAL_TABLET | ORAL | Status: AC
  Filled 2022-07-09: qty 1

## 2022-07-09 MED ORDER — MELATONIN 5 MG PO TABS
5.0000 mg | ORAL_TABLET | Freq: Once | ORAL | Status: AC
  Administered 2022-07-09: 5 mg via ORAL
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
## 2022-07-09 NOTE — Progress Notes (Signed)
Pt seen and given scheduled nebulizer which she tolerated well.  No increased wob / respiratory distress noted or voiced by patient.  Bipap not indicated at this time.

## 2022-07-09 NOTE — Progress Notes (Signed)
Progress Note   Patient: [name] FYB:[id number] DOB: 11-20-1945 DOA: 07/06/2022     2 DOS: the patient was seen and examined on 07/09/2022   Brief hospital course: 76 y.o. female with medical history significant of paroxysmal atrial fibrillation, anxiety, depression, osteoarthritis, chronic diastolic CHF, dyspnea, GERD, hiatal hernia, history of headaches, hyperlipidemia, hypertension, hypothyroidism, legally blind, history of pneumonia, pulmonary hypertension, restless leg syndrome, retinitis pigmentosa who is coming to the emergency department due to falling on her left knee twice earlier in the day.  She has also been dyspneic for the past 2 days.  The patient's daughters stated that she has been hallucinating and not making sense sometimes with her conversations.  She recently was paranoid and delusional about her husband being in front of the house talking to another woman.  She is unable to provide a coherent HPI.  She is able to answer simple questions.  She is having left knee pain, but no headache, chest or abdominal pain at this time.   Assessment and Plan: Principal Problem:   Acute respiratory failure with hypoxia (HCC) In the setting of:   CAP (community acquired pneumonia)  -Continue supplemental oxygen, wean as tolerated, currently on 4L -continue bronchodilators as needed -Continue ceftriaxone with doxycycline due to QT prolongation. -strep pneumoniae urinary antigen neg -WBC improved to 11k   Active Problems:   Prolonged QT interval -Held trazodone and Seroquel. Avoid QT prolonging meds. Keep electrolytes optimized.     Left knee pain Orthopedic Surgery consulted. Appreciate input by Dr. Charlann Boxer -Recommendation for no surgical intervention at this point -Recs to continue with suppressive abx, doxy, moving forward -Pt to f/u with Dr. Charlann Boxer in 4-6 weeks     PAF (paroxysmal atrial fibrillation) (HCC) Continue amiodarone and apixaban. -Currently rate controlled      Essential hypertension -Continue furosemide 40 mg p.o. daily.  . -Continue metoprolol 25 mg p.o. daily.     Paranoid schizophrenia (HCC)   Hallucinations Consulted behavioral health. Check B12 223 TSH 0.919 Hold trazodone/quetiapine due to prolonged QT. Appreciate input by Psychiatry. Recommendation to continue current psych meds Recs for Inpatient Psych. Psychiatry planned to reassess today if pt is interested in voluntarily going for inpt treatment, will f/u     RLS (restless legs syndrome) Hold off on requip for now  Recent outpt records per Neurology on 11/15 recommended discontinuing "Requip and initiating the cogentin to treat the RLS/possible akithisia from olanzapine"     Hypothyroidism Check TSH 0.919 Continue levothyroxine 88 mcg p.o. daily.     Tardive dyskinesia Continue Cogentin at bedtime.      Subjective: Complains of some chest congestion.   Physical Exam: Vitals:   07/09/22 0214 07/09/22 0500 07/09/22 0907 07/09/22 1334  BP: 120/77   130/86  Pulse: 74   76  Resp: 20   20  Temp: 98.6 F (37 C)   98 F (36.7 C)  TempSrc: Oral   Oral  SpO2: 96%  94% 98%  Weight:  74.8 kg    Height:       General exam: Awake, laying in bed, in nad Respiratory system: Normal respiratory effort, no wheezing Cardiovascular system: regular rate, s1, s2 Gastrointestinal system: Soft, nondistended, positive BS Central nervous system: CN2-12 grossly intact, strength intact Extremities: Perfused, no clubbing Skin: Normal skin turgor, no notable skin lesions seen Psychiatry: Mood normal // no visual hallucinations   Data Reviewed:  Labs reviewed: Na 136, K 4.3, Cr 0.87, Hgb 10.8  Family Communication: Pt in  room, family not at bedside  Disposition: Status is: Inpatient Remains inpatient appropriate because: Severity of illness  Planned Discharge Destination:  Inpatient psych     Author: Rickey Barbara, MD 07/09/2022 4:30 PM  For on call review [email or web address].

## 2022-07-09 NOTE — Progress Notes (Signed)
Patient ID: [name], female   DOB: Apr 09, 1946, 76 y.o.   MRN: [id number] Reason for Consult: left knee pain Referring Physician:  Rhona Leavenshiu, MD  [name] is an 76 y.o. female.  HPI: [name] is a 76 y.o. female who presents to the Emergency Department complaining of fall.  She presents to the emergency department accompanied by her daughter for evaluation following a fall today.  She lives with her husband and is legally blind and usually has assistance with going in and out the garage.  Today while she was in the garage she attempted to leave on her own and she tripped and fell and attempted to get back up and fell again.  She states that she twisted her left knee and has pain from the knee down.  Daughter reports that she the patient has also been short of breath for the last 1 to 2 days and gets dizzy when she tries to stand.  No reports of fevers, chest pain.  Patient does have a history of dementia, which limits some of the history taking.   She has a history of infected knee replacement and was recently treated with antibiotics for possible cellulitis of the lower extremity.  She also has a history of atrial fibrillation on anticoagulation, CHF.  [name] is well-known to me from her prior surgical management of her left knee.  She had a very complex surgical history involving the left knee.  She has been on suppressive antibiotics due to concern for chronic infection involving her left knee. She was seen in the office a month ago.  At that time there was no obvious concern for issues pertaining to her left knee.  I did note that she had bilateral lower extremity edema with mild erythematous changes and thus placed her on cefadroxil for couple weeks to see if it would help lower extremity cellulitic issues.  I also had made recommendations for her to follow-up with her primary care physician to see if there were other diuretics that may be better to decrease the lower extremity  edema as she was already on Lasix. We were consulted to see her based on her left knee pain.  Past Medical History:  Diagnosis Date   A-fib (HCC)    PAROXYSMAL  AFIB ON ELIQUIS , MGD BY CARDIO DR Willeen CassWAHID AT [facility]    Anxiety    Arthritis    CHF (congestive heart failure) (HCC)    diastolic Hf   Dyspnea    at times   Dysrhythmia    a fib   Family history of adverse reaction to anesthesia    Daughter hard to wake up   GERD (gastroesophageal reflux disease)    Headache    hx of   History of hiatal hernia    History of kidney stones    Hyperlipidemia    Hypertension    Hypothyroidism    Legally blind    per patient ,patient reports she can only see a blur of images;  needs visual guidance for day to day activities ;    Palpitations    Pneumonia    Pulmonary HTN (HCC)    moderate, see ECHO care everywhere ; patient denies any increases repiratory effort today    Restless leg syndrome    Retinitis pigmentosa     Past Surgical History:  Procedure Laterality Date   EXCISIONAL TOTAL KNEE ARTHROPLASTY WITH ANTIBIOTIC SPACERS Left 11/11/2016   Procedure: Left total knee arthroplasty resection  and placement of antibiotic spacer;  Surgeon: Durene Romans, MD;  Location: [facility];  Service: Orthopedics;  Laterality: Left;  Adductor Block   EYE SURGERY     cataract extraction   I & D KNEE WITH POLY EXCHANGE Left 12/15/2018   Procedure: IRRIGATION AND DEBRIDEMENT left total knee with poly exchange;  Surgeon: Durene Romans, MD;  Location: [facility];  Service: Orthopedics;  Laterality: Left;    KNEE ARTHROPLASTY Left 08/1997   done [facility] in [place], [place]   knee arthroplasty Left    x4 revision d/t infection and failed initial arthroplasty   NECK SURGERY  2016   ORIF WRIST FRACTURE  09/2018   METAL IN PLACE    Reimplantatio of Left total knee     03/17/17 Dr. Charlann Boxer   REIMPLANTATION OF TOTAL KNEE Left 03/17/2017   Procedure: REIMPLANTATION OF LEFT TOTAL KNEE Arthroplasty;  Surgeon: Durene Romans, MD;  Location: [facility];  Service: Orthopedics;  Laterality: Left;    History reviewed. No pertinent family history.  Social History:  reports that she has quit smoking. Her smoking use included cigarettes. She has never used smokeless tobacco. She reports that she does not drink alcohol and does not use drugs.  Allergies:  Allergies  Allergen Reactions   Nickel Rash   Other Rash and Other (See Comments)    METAL.  HAS HAD PROBLEMS WITH KNEE REPLACEMENT.  CANNOT WEAR COSTUME JEWELRY - CAUSES RASH/OOZE/ ITCH.  Allergen: Metals  Reaction: Hives, itching, rash, infection  Metal  Pt states other meds she cannot think of   Sulfamethoxazole-Trimethoprim Hives and Rash   Deutetrabenazine     Other Reaction(s): Other  "didn't work and was too expensive"   Pramipexole     "didn't work"   Statins Nausea And Vomiting and Rash    Makes her sick on stomach  Makes her sick on stomach  Makes her sick on stomach  Makes her sick on stomach  Joints ached   Amiodarone Other (See Comments)    Restless legs  Other Reaction(s): Not available   Metoprolol Other (See Comments)    Restless legs  Other Reaction(s): Not available   Benadryl [Diphenhydramine Hcl] Hives and Rash   Doxycycline Diarrhea    Medications: I have reviewed the patient's current medications. Scheduled:  amiodarone  200 mg Oral BID   apixaban  5 mg Oral BID   benztropine  1 mg Oral QHS   carbidopa-levodopa  1 tablet Oral 3 times per day   DULoxetine  60 mg Oral Daily   furosemide  40 mg Oral Daily   gabapentin  300 mg Oral TID   ipratropium-albuterol  3 mL Nebulization BID   levothyroxine  88 mcg Oral QAC breakfast   metoprolol succinate  25 mg Oral Daily   mouth rinse  15 mL Mouth Rinse 4 times per day   pantoprazole  40 mg Oral QHS   potassium chloride  10 mEq Oral Daily    Results for orders placed or performed during the hospital encounter of 07/06/22 (from the past 24 hour(s))  Vitamin B12      Status: None   Collection Time: 07/08/22 10:09 AM  Result Value Ref Range   Vitamin B-12 223 180 - 914 pg/mL  TSH     Status: None   Collection Time: 07/08/22 10:09 AM  Result Value Ref Range   TSH 0.919 0.350 - 4.500 uIU/mL  Comprehensive metabolic panel     Status: Abnormal  Collection Time: 07/09/22  4:12 AM  Result Value Ref Range   Sodium 136 135 - 145 mmol/L   Potassium 4.3 3.5 - 5.1 mmol/L   Chloride 105 98 - 111 mmol/L   CO2 24 22 - 32 mmol/L   Glucose, Bld 130 (H) 70 - 99 mg/dL   BUN 15 8 - 23 mg/dL   Creatinine, Ser 1.95 0.44 - 1.00 mg/dL   Calcium 8.3 (L) 8.9 - 10.3 mg/dL   Total Protein 6.2 (L) 6.5 - 8.1 g/dL   Albumin 3.2 (L) 3.5 - 5.0 g/dL   AST 17 15 - 41 U/L   ALT 8 0 - 44 U/L   Alkaline Phosphatase 77 38 - 126 U/L   Total Bilirubin 0.7 0.3 - 1.2 mg/dL   GFR, Estimated >09 >32 mL/min   Anion gap 7 5 - 15  CBC     Status: Abnormal   Collection Time: 07/09/22  4:12 AM  Result Value Ref Range   WBC 11.1 (H) 4.0 - 10.5 K/uL   RBC 3.60 (L) 3.87 - 5.11 MIL/uL   Hemoglobin 10.8 (L) 12.0 - 15.0 g/dL   HCT 67.1 (L) 24.5 - 80.9 %   MCV 96.4 80.0 - 100.0 fL   MCH 30.0 26.0 - 34.0 pg   MCHC 31.1 30.0 - 36.0 g/dL   RDW 98.3 38.2 - 50.5 %   Platelets 237 150 - 400 K/uL   nRBC 0.0 0.0 - 0.2 %  Magnesium     Status: None   Collection Time: 07/09/22  4:12 AM  Result Value Ref Range   Magnesium 2.4 1.7 - 2.4 mg/dL     X-ray: CLINICAL DATA:  Left posterior knee pain.  Fall.   EXAM: LEFT KNEE - COMPLETE 4+ VIEW   COMPARISON:  None Available.   FINDINGS: Changes of left knee replacement. No acute fracture, subluxation or dislocation. Lucency around the distal femoral component could reflect loosening. No joint effusion.   IMPRESSION: No acute posttraumatic bone abnormality.   Lucency around the distal femoral component of the left knee replacement suggesting loosening.     Electronically Signed   By: Charlett Nose M.D.  CLINICAL DATA:  76 year old  female with history of shortness of breath. Fall. Left leg pain.   EXAM: LEFT TIBIA AND FIBULA - 2 VIEW   COMPARISON:  Left knee radiographs 07/07/2022. CT of the left knee 11/13/2016.   FINDINGS: Multiple views of the left tibia and fibula demonstrate postoperative changes of left total knee arthroplasty. Cerclage wires are noted adjacent to the distal femoral diaphysis. Lucency along the medial femoral condyle just lateral to the femoral prosthesis, potentially reflecting loosening of the hardware (although no prior studies are available for comparison following implantation of the hardware). Tibia and fibula appear intact without definite acute displaced fracture. There is a small amount of lucency adjacent to the distal aspect of the tibial component of the prosthesis, which may suggest some mild loosening of the hardware in this region as well. In addition, there is loss of bone in the medial tibial plateau, which could be chronic based on comparison with remote prior CT examination. Mild diffuse soft tissue swelling.   IMPRESSION: 1. Status post left total knee arthroplasty, with lucency adjacent to both the tibial and femoral components of the prosthesis, as above. Based on comparison with remote prior CT examination (before the implantation of the knee prosthesis) these areas of lucency adjacent to the knee joint may be chronic, although there is  some lucency adjacent to the distal aspect of the tibial stem, suggesting some degree of motion of the prosthesis. 2. Negative for acute fracture.     Electronically Signed   By: Trudie Reed M.D.  Review of the radiographs obtained at the time of her admission were compared to the radiographs in our office.  There is no acute changes.  Again given the complexity of her surgery in the setting of prior infection we have been observing her knee conservatively as any further surgery on her knee may be excessively  morbid.  ROS As per HPI  Blood pressure 120/77, pulse 74, temperature 98.6 F (37 C), temperature source Oral, resp. rate 20, height 5' (1.524 m), weight 74.8 kg, SpO2 96 %, unknown if currently breastfeeding.  Physical Exam: Pleasant 76 year old female awake alert and oriented. She is in no acute distress.  She is seen laying in her hospital bed this morning  Left knee exam: Her surgical incision remains well-healed There is no obvious erythema or warmth involving the left knee No palpable effusion She does have some fullness posteriorly The lower extremity edema that I had recognize in the office at her last visit is significantly improved in a supine position with wrinkling of her skin distally.  There is mild erythematous changes in the lower extremities.  Assessment/Plan: 1.  Complex surgical history involving her left knee with no acute changes noted  Plan: Upon review of her clinical and radiographic condition of her left knee I would not recommend any further surgical invention at this point.  I would recommend that she remain on suppressive antibiotics which I believe is currently doxycycline  When she is treated for her underlying pneumonia at time of presentation per her reports or any active cellulitic concerns she can return on oral suppressive antibiotics. I will be glad to follow-up with her in the office in 4 to 6 weeks.  [name] 07/09/2022, 8:26 AM

## 2022-07-09 NOTE — Progress Notes (Signed)
PT demonstrated hands on understanding of Flutter device. 

## 2022-07-10 LAB — COMPREHENSIVE METABOLIC PANEL
ALT: 17 U/L (ref 0–44)
AST: 22 U/L (ref 15–41)
Albumin: 3.6 g/dL (ref 3.5–5.0)
Alkaline Phosphatase: 84 U/L (ref 38–126)
Anion gap: 10 (ref 5–15)
BUN: 13 mg/dL (ref 8–23)
CO2: 24 mmol/L (ref 22–32)
Calcium: 9 mg/dL (ref 8.9–10.3)
Chloride: 104 mmol/L (ref 98–111)
Creatinine, Ser: 0.89 mg/dL (ref 0.44–1.00)
GFR, Estimated: >60 mL/min (ref >60)
Glucose, Bld: 131 mg/dL — ABNORMAL HIGH (ref 70–99)
Potassium: 4.6 mmol/L (ref 3.5–5.1)
Sodium: 138 mmol/L (ref 135–145)
Total Bilirubin: 0.6 mg/dL (ref 0.3–1.2)
Total Protein: 7.2 g/dL (ref 6.5–8.1)

## 2022-07-10 LAB — CBC
HCT: 38.9 % (ref 36.0–46.0)
Hemoglobin: 12 g/dL (ref 12.0–15.0)
MCH: 29.8 pg (ref 26.0–34.0)
MCHC: 30.8 g/dL (ref 30.0–36.0)
MCV: 96.5 fL (ref 80.0–100.0)
Platelets: 284 10*3/uL (ref 150–400)
RBC: 4.03 MIL/uL (ref 3.87–5.11)
RDW: 15.1 % (ref 11.5–15.5)
WBC: 11.1 10*3/uL — ABNORMAL HIGH (ref 4.0–10.5)
nRBC: 0 % (ref 0.0–0.2)

## 2022-07-10 MED ORDER — OLANZAPINE 5 MG PO TABS
5.0000 mg | ORAL_TABLET | Freq: Every day | ORAL | Status: DC
  Administered 2022-07-10 – 2022-07-14 (×5): 5 mg via ORAL
  Filled 2022-07-10 (×5): qty 1

## 2022-07-10 MED ORDER — DIAZEPAM 2 MG PO TABS
1.0000 mg | ORAL_TABLET | Freq: Once | ORAL | Status: AC
  Administered 2022-07-10: 1 mg via ORAL
  Filled 2022-07-10: qty 1

## 2022-07-10 MED ORDER — VALBENAZINE TOSYLATE 40 MG PO CAPS
40.0000 mg | ORAL_CAPSULE | Freq: Every day | ORAL | Status: DC
  Administered 2022-07-10 – 2022-07-12 (×3): 40 mg via ORAL
  Filled 2022-07-10 (×3): qty 1

## 2022-07-10 MED ORDER — LOPERAMIDE HCL 2 MG PO CAPS
2.0000 mg | ORAL_CAPSULE | ORAL | Status: DC | PRN
  Administered 2022-07-10 (×2): 2 mg via ORAL
  Filled 2022-07-10 (×2): qty 1

## 2022-07-10 NOTE — Plan of Care (Signed)

## 2022-07-10 NOTE — Progress Notes (Signed)
The patient is alert oriented x3 today. She was encouraged to await staff for assistance when getting out of bed. Upon arrival pt was steady at bedside with walker. The patient is complaining of restless legs and says she needs to stand.

## 2022-07-10 NOTE — Progress Notes (Signed)
Mobility Specialist - Progress Note   07/10/22 1400  Mobility  Activity Ambulated with assistance in hallway  Level of Assistance Contact guard assist, steadying assist  Assistive Device Front wheel walker  Distance Ambulated (ft) 350 ft  Range of Motion/Exercises Active  Activity Response Tolerated well  Mobility Referral Yes  $Mobility charge 1 Mobility   Pt was found in bed and agreeable to ambulate. Pt did not seem fully awake and recommend to try again later but instead on wanting to walk now. Had a L knee buckle when standing from recliner and almost hit a wall while turning a corner but was assisted on recovery. At EOS returned to recliner chair with necessities in reach and RN notified.  Billey Chang Mobility Specialist

## 2022-07-10 NOTE — Progress Notes (Signed)
Mobility Specialist - Progress Note   07/10/22 1125  Mobility  Activity Ambulated with assistance to bathroom;Ambulated with assistance in hallway  Level of Assistance Standby assist, set-up cues, supervision of patient - no hands on  Assistive Device Front wheel walker  Distance Ambulated (ft) 350 ft  Range of Motion/Exercises Active  Activity Response Tolerated well  Mobility Referral Yes  $Mobility charge 1 Mobility   Pt was found sitting EOB stating she needed to use the bathroom. Pt was assisted to bathroom and afterwards agreeable to ambulate in hallway. C/o being cold and her L leg still sore. At EOS returned to recliner chair with necessities in reach and NT notified.  Billey Chang Mobility Specialist

## 2022-07-10 NOTE — Progress Notes (Signed)
  Progress Note   Patient: [name] PXT:[id number] DOB: 09-27-1945 DOA: 07/06/2022     3 DOS: the patient was seen and examined on 07/10/2022 at 9:43AM      Brief hospital course: 76 y.o. female with medical history significant of paroxysmal atrial fibrillation, anxiety, depression, osteoarthritis, chronic diastolic CHF, dyspnea, GERD, hiatal hernia, history of headaches, hyperlipidemia, hypertension, hypothyroidism, legally blind, history of pneumonia, pulmonary hypertension, restless leg syndrome, retinitis pigmentosa who presented due to falling on her left knee twice earlier in the day.  She has also been dyspneic for the past 2 days.  The patient's daughters stated that she had been hallucinating and not making sense sometimes with her conversations.  She recently was paranoid and delusional about her husband being in front of the house talking to another woman.  She was unable to provide a coherent HPI.       Assessment and Plan: Acute respiratory failure with hypoxia due to community-acquired pneumonia -Continue Rocephin - Stop doxycycline  Paranoid schizophrenia Patient presented with decompensated paranoid schizophrenia, and hallucinations which did not resolve with treatment of her underlying pneumonia. -Continue olanzapine - Start valbenazine - Consult psychiatry, appreciate recommendations, plan for transfer from the hospital to inpatient psychiatry given decompensated paranoid schizophrenia, patient is not under IVC  Left knee pain -Continue oxycodone, gabapentin - PT eval  Abnormal leg movements Psychiatry suspect this is actually tardive dyskinesia and not restless leg syndrome Requip may exacerbate hallucinations. - Continue Cogentin - Start Ingrezza  Secondary Parkinsonism Follows with [facility] - Continue Sinemet  Tardive dyskinesia See above  Paroxysmal atrial fibrillation Rate controlled - Continue metoprolol - Continue  Eliquis  Essential hypertension BP normal - Continue furosemide, metoprolol  Hypothyroidism - Continue levothyroxine  Retinitis pigmentosa Partially blind       Subjective: Patient complaining of leg movements.  No fever, cough, sputum, dyspnea.     Physical Exam: BP 120/66 (BP Location: Right Arm)   Pulse 82   Temp 98.6 F (37 C) (Oral)   Resp 20   Ht 5' (1.524 m)   Wt 74.5 kg   SpO2 95%   BMI 32.08 kg/m   Elderly adult female, sitting in the edge of the bed, no acute distress, interactive and appropriate RRR, no murmurs, no peripheral edema Respiratory rate normal, lungs clear without rales or wheezes Abdomen soft without tenderness palpation or guarding, no ascites or distention Attention normal, affect appropriate, judgment and insight appear normal, some of her thought processes seem tangential and delusional    Data Reviewed: Discussed with psychiatry by secure chat CMP shows no acute change HIV negative Complete blood count normal    Family Communication: None present    Disposition: Status is: Inpatient The patient was admitted for pneumonia.  From the standpoint of pneumonia she is medically stable for discharge to inpatient psychiatry at any time        Author: Alberteen Sam, MD 07/10/2022 3:49 PM  For on call review [email or web address].

## 2022-07-10 NOTE — Care Management Important Message (Signed)
Important Message  Patient Details IM Letter given Name: [name] MRN: [id number] Date of Birth: [birth year]   Medicare Important Message Given:  Yes     Caren Macadam 07/10/2022, 9:22 AM

## 2022-07-10 NOTE — Consult Note (Signed)
[facility] Face-to-Face Psychiatry Consult   Reason for Consult:  History of schizophrenia, history of depression.  Admitted for pneumonia.  Daughters had noticed that she has been very forgetful, paranoid, delusional and having hallucinations.  Referring Physician:  Dr. Rhona Leavens Patient Identification: [name] MRN:  [id number] Principal Diagnosis: Acute respiratory failure with hypoxia [facility]) Diagnosis:  Principal Problem:   Acute respiratory failure with hypoxia (HCC) Active Problems:   PAF (paroxysmal atrial fibrillation) (HCC)   Essential hypertension   Hallucinations   Paranoid schizophrenia (HCC)   RLS (restless legs syndrome)   Hypothyroidism   Tardive dyskinesia   Prolonged QT interval   Left knee pain   CAP (community acquired pneumonia)   Total Time spent with patient: 1 hour  Subjective:   [name] is a 76 y.o. female patient admitted with pneumonia and respiratory failure.  Patient seen and reassessed by the psychiatric nurse practitioner; chart reviewed and case discussed with Dr. Lolly Mustache.   On assessment today, patient denies any symptoms of depression or anxiety. Patient initially was feeling distressed by the intensity of her hallucinations and delusions towards her husband. Starting from yesterday evening, the intensity of her hallucinations were less and patient currently recognizes them as not real. She endorses seeing children running and playing, however disappeared when she blinked her eyes. She states " I do want you to help me with these things Im seeing and my restless legs. Discussed with patient suspect she has tardive Dyskinesia as opposed to Restless leg syndrome. We did review the differences, and patient vocalized wanting to start a different medication such as Ingrezza to see if it would help.  She denies any violent thoughts or active/passive homicidal thoughts. Denies any active or passive suicidal thoughts. Patient has not engaged in any violent or  self harming behaviors. She continues to endorse interest in hospitalization at this time and there is no indication for involuntary hospitalization. We reviewed course of treatment for average of 5-7 days. She will continue to benefit for medication management, stabilization, cognitive behavioral therapy and group setting. Her hallucinations have not improved despite treatment of her pneumonia and urinary tract infection, suggestive of further psychiatric hospitalization.    HPI:  [name] is a 76 y.o. female with medical history significant of paroxysmal atrial fibrillation, anxiety, depression, osteoarthritis, chronic diastolic CHF, dyspnea, GERD, hiatal hernia, history of headaches, hyperlipidemia, hypertension, hypothyroidism, legally blind, history of pneumonia, pulmonary hypertension, restless leg syndrome, retinitis pigmentosa who is coming to the emergency department due to falling on her left knee twice earlier in the day.  She has also been dyspneic for the past 2 days.  The patient's daughters stated that she has been hallucinating and not making sense sometimes with her conversations.  She recently was paranoid and delusional about her husband being in front of the house talking to another woman.  She is unable to provide a coherent HPI.  She is able to answer simple questions.  She is having left knee pain, but no headache, chest or abdominal pain at this time.   Past Psychiatric History: Paranoid Schizophrenia  Risk to Self:   Denies Risk to Others:   Denies Prior Inpatient Therapy:   Denies Prior Outpatient Therapy:   [name], at [facility] IM clinic  Past Medical History:  Past Medical History:  Diagnosis Date   A-fib (HCC)    PAROXYSMAL  AFIB ON ELIQUIS , MGD BY CARDIO DR [facility]    Anxiety  Arthritis    CHF (congestive heart failure) (HCC)    diastolic Hf   Dyspnea    at times   Dysrhythmia    a fib   Family history of adverse reaction to anesthesia     Daughter hard to wake up   GERD (gastroesophageal reflux disease)    Headache    hx of   History of hiatal hernia    History of kidney stones    Hyperlipidemia    Hypertension    Hypothyroidism    Legally blind    per patient ,patient reports she can only see a blur of images;  needs visual guidance for day to day activities ;    Palpitations    Pneumonia    Pulmonary HTN (HCC)    moderate, see ECHO care everywhere ; patient denies any increases repiratory effort today    Restless leg syndrome    Retinitis pigmentosa     Past Surgical History:  Procedure Laterality Date   EXCISIONAL TOTAL KNEE ARTHROPLASTY WITH ANTIBIOTIC SPACERS Left 11/11/2016   Procedure: Left total knee arthroplasty resection and placement of antibiotic spacer;  Surgeon: Paralee Cancel, MD;  Location: [facility];  Service: Orthopedics;  Laterality: Left;  Adductor Block   EYE SURGERY     cataract extraction   I & D KNEE WITH POLY EXCHANGE Left 12/15/2018   Procedure: IRRIGATION AND DEBRIDEMENT left total knee with poly exchange;  Surgeon: Paralee Cancel, MD;  Location: [facility];  Service: Orthopedics;  Laterality: Left;  66min   KNEE ARTHROPLASTY Left 08/1997   done bu [place] in [place], [place]   knee arthroplasty Left    x4 revision d/t infection and failed initial arthroplasty   NECK SURGERY  2016   ORIF WRIST FRACTURE  09/2018   METAL IN PLACE    Reimplantatio of Left total knee     03/17/17 Dr. Alvan Dame   REIMPLANTATION OF TOTAL KNEE Left 03/17/2017   Procedure: REIMPLANTATION OF LEFT TOTAL KNEE Arthroplasty;  Surgeon: Paralee Cancel, MD;  Location: [facility];  Service: Orthopedics;  Laterality: Left;   Family History: History reviewed. No pertinent family history. Family Psychiatric  History: Denies Social History:  Social History   Substance and Sexual Activity  Alcohol Use No     Social History   Substance and Sexual Activity  Drug Use No    Social History   Socioeconomic History   Marital status: Married     Spouse name: Not on file   Number of children: Not on file   Years of education: Not on file   Highest education level: Not on file  Occupational History   Not on file  Tobacco Use   Smoking status: Former    Years: 28.00    Types: Cigarettes   Smokeless tobacco: Never   Tobacco comments:    quit 30 years ago  Vaping Use   Vaping Use: Never used  Substance and Sexual Activity   Alcohol use: No   Drug use: No   Sexual activity: Not Currently  Other Topics Concern   Not on file  Social History Narrative   Not on file   Social Determinants of Health   Financial Resource Strain: Not on file  Food Insecurity: No Food Insecurity (07/07/2022)   Hunger Vital Sign    Worried About Running Out of Food in the Last Year: Never true    Ran Out of Food in the Last Year: Never true  Transportation Needs: No Transportation Needs (  07/07/2022)   PRAPARE - Hydrologist (Medical): No    Lack of Transportation (Non-Medical): No  Physical Activity: Not on file  Stress: Not on file  Social Connections: Not on file   Additional Social History:    Allergies:   Allergies  Allergen Reactions   Nickel Rash   Other Rash and Other (See Comments)    METAL.  HAS HAD PROBLEMS WITH KNEE REPLACEMENT.  [place] RASH/OOZE/ [place].  Allergen: Metals  Reaction: Hives, itching, rash, infection  Metal  Pt states other meds she cannot think of   Sulfamethoxazole-Trimethoprim Hives and Rash   Deutetrabenazine     Other Reaction(s): Other  "didn't work and was too expensive"   Pramipexole     "didn't work"   Statins Nausea And Vomiting and Rash    Makes her sick on stomach  Makes her sick on stomach  Makes her sick on stomach  Makes her sick on stomach  Joints ached   Amiodarone Other (See Comments)    Restless legs  Other Reaction(s): Not available   Metoprolol Other (See Comments)    Restless legs  Other Reaction(s): Not available    Benadryl [Diphenhydramine Hcl] Hives and Rash   Doxycycline Diarrhea    Labs:  Results for orders placed or performed during the hospital encounter of 07/06/22 (from the past 48 hour(s))  Comprehensive metabolic panel     Status: Abnormal   Collection Time: 07/09/22  4:12 AM  Result Value Ref Range   Sodium 136 135 - 145 mmol/L   Potassium 4.3 3.5 - 5.1 mmol/L   Chloride 105 98 - 111 mmol/L   CO2 24 22 - 32 mmol/L   Glucose, Bld 130 (H) 70 - 99 mg/dL    Comment: Glucose reference range applies only to samples taken after fasting for at least 8 hours.   BUN 15 8 - 23 mg/dL   Creatinine, Ser 0.87 0.44 - 1.00 mg/dL   Calcium 8.3 (L) 8.9 - 10.3 mg/dL   Total Protein 6.2 (L) 6.5 - 8.1 g/dL   Albumin 3.2 (L) 3.5 - 5.0 g/dL   AST 17 15 - 41 U/L   ALT 8 0 - 44 U/L   Alkaline Phosphatase 77 38 - 126 U/L   Total Bilirubin 0.7 0.3 - 1.2 mg/dL   GFR, Estimated >60 >60 mL/min    Comment: (NOTE) Calculated using the CKD-EPI Creatinine Equation (2021)    Anion gap 7 5 - 15    Comment: Performed at [facility], [place] [address] [place], [place] [address]  CBC     Status: Abnormal   Collection Time: 07/09/22  4:12 AM  Result Value Ref Range   WBC 11.1 (H) 4.0 - 10.5 K/uL   RBC 3.60 (L) 3.87 - 5.11 MIL/uL   Hemoglobin 10.8 (L) 12.0 - 15.0 g/dL   HCT 34.7 (L) 36.0 - 46.0 %   MCV 96.4 80.0 - 100.0 fL   MCH 30.0 26.0 - 34.0 pg   MCHC 31.1 30.0 - 36.0 g/dL   RDW 15.2 11.5 - 15.5 %   Platelets 237 150 - 400 K/uL   nRBC 0.0 0.0 - 0.2 %    Comment: Performed at [facility], [place] [address] [place], [place] [address]  Magnesium     Status: None   Collection Time: 07/09/22  4:12 AM  Result Value Ref Range   Magnesium 2.4 1.7 - 2.4  mg/dL    Comment: Performed at [facility], [place] [address], [place], [place] [address]  HIV Antibody (routine testing w rflx)     Status: None   Collection Time: 07/09/22 12:45 PM  Result Value Ref Range    HIV Screen 4th Generation wRfx Non Reactive Non Reactive    Comment: Performed at [facility], [place] [address] [place], [place] [address]  Comprehensive metabolic panel     Status: Abnormal   Collection Time: 07/10/22  8:08 AM  Result Value Ref Range   Sodium 138 135 - 145 mmol/L   Potassium 4.6 3.5 - 5.1 mmol/L   Chloride 104 98 - 111 mmol/L   CO2 24 22 - 32 mmol/L   Glucose, Bld 131 (H) 70 - 99 mg/dL    Comment: Glucose reference range applies only to samples taken after fasting for at least 8 hours.   BUN 13 8 - 23 mg/dL   Creatinine, Ser 0.89 0.44 - 1.00 mg/dL   Calcium 9.0 8.9 - 10.3 mg/dL   Total Protein 7.2 6.5 - 8.1 g/dL   Albumin 3.6 3.5 - 5.0 g/dL   AST 22 15 - 41 U/L   ALT 17 0 - 44 U/L   Alkaline Phosphatase 84 38 - 126 U/L   Total Bilirubin 0.6 0.3 - 1.2 mg/dL   GFR, Estimated >60 >60 mL/min    Comment: (NOTE) Calculated using the CKD-EPI Creatinine Equation (2021)    Anion gap 10 5 - 15    Comment: Performed at [facility], [place] [address] [place], [place] [address]  CBC     Status: Abnormal   Collection Time: 07/10/22  8:08 AM  Result Value Ref Range   WBC 11.1 (H) 4.0 - 10.5 K/uL   RBC 4.03 3.87 - 5.11 MIL/uL   Hemoglobin 12.0 12.0 - 15.0 g/dL   HCT 38.9 36.0 - 46.0 %   MCV 96.5 80.0 - 100.0 fL   MCH 29.8 26.0 - 34.0 pg   MCHC 30.8 30.0 - 36.0 g/dL   RDW 15.1 11.5 - 15.5 %   Platelets 284 150 - 400 K/uL   nRBC 0.0 0.0 - 0.2 %    Comment: Performed at [facility], [place] [address] [place],  [address]    Current Facility-Administered Medications  Medication Dose Route Frequency Provider Last Rate Last Admin   acetaminophen (TYLENOL) tablet 650 mg  650 mg Oral Q6H PRN Howerter, Justin B, DO   650 mg at 07/07/22 1521   Or   acetaminophen (TYLENOL) suppository 650 mg  650 mg Rectal Q6H PRN Howerter, Justin B, DO       albuterol (PROVENTIL) (2.5 MG/3ML) 0.083% nebulizer solution 2.5 mg  2.5 mg  Nebulization Q4H PRN Reubin Milan, MD       amiodarone (PACERONE) tablet 200 mg  200 mg Oral BID Reubin Milan, MD   200 mg at 07/10/22 1018   apixaban (ELIQUIS) tablet 5 mg  5 mg Oral BID Howerter, Justin B, DO   5 mg at 07/10/22 1017   benztropine (COGENTIN) tablet 1 mg  1 mg Oral QHS Reubin Milan, MD   1 mg at 07/09/22 2138   carbidopa-levodopa (SINEMET IR) 25-100 MG per tablet immediate release 1 tablet  1 tablet Oral 3 times per day Reubin Milan, MD   1 tablet at 07/10/22 0900   cefTRIAXone (ROCEPHIN) 1 g in sodium chloride 0.9 % 100 mL  IVPB  1 g Intravenous Q24H Reubin Milan, MD 200 mL/hr at 07/10/22 1027 1 g at 07/10/22 1027   DULoxetine (CYMBALTA) DR capsule 60 mg  60 mg Oral Daily Reubin Milan, MD   60 mg at 07/10/22 1018   furosemide (LASIX) tablet 40 mg  40 mg Oral Daily Reubin Milan, MD   40 mg at 07/10/22 1017   gabapentin (NEURONTIN) capsule 300 mg  300 mg Oral TID Reubin Milan, MD   300 mg at 07/10/22 1017   hydrOXYzine (ATARAX) tablet 25 mg  25 mg Oral Daily PRN Reubin Milan, MD   25 mg at 07/09/22 2144   ipratropium-albuterol (DUONEB) 0.5-2.5 (3) MG/3ML nebulizer solution 3 mL  3 mL Nebulization BID Reubin Milan, MD   3 mL at 07/10/22 0848   levothyroxine (SYNTHROID) tablet 88 mcg  88 mcg Oral QAC breakfast Reubin Milan, MD   88 mcg at 07/10/22 0522   loperamide (IMODIUM) capsule 2 mg  2 mg Oral PRN Edwin Dada, MD   2 mg at 07/10/22 1312   metoprolol succinate (TOPROL-XL) 24 hr tablet 25 mg  25 mg Oral Daily Reubin Milan, MD   25 mg at 07/10/22 1019   OLANZapine (ZYPREXA) tablet 5 mg  5 mg Oral QHS Suella Broad, FNP       Oral care mouth rinse  15 mL Mouth Rinse 4 times per day Reubin Milan, MD   15 mL at 07/10/22 1027   Oral care mouth rinse  15 mL Mouth Rinse PRN Reubin Milan, MD       oxyCODONE-acetaminophen (PERCOCET/ROXICET) 5-325 MG per tablet 1 tablet  1  tablet Oral TID PRN Tawnya Crook, RPH   1 tablet at 07/09/22 1548   And   oxyCODONE (Oxy IR/ROXICODONE) immediate release tablet 5 mg  5 mg Oral TID PRN Tawnya Crook, RPH       pantoprazole (PROTONIX) EC tablet 40 mg  40 mg Oral QHS Reubin Milan, MD   40 mg at 07/09/22 2138   potassium chloride (KLOR-CON M) CR tablet 10 mEq  10 mEq Oral Daily Reubin Milan, MD   10 mEq at 07/10/22 1017   valbenazine (INGREZZA) capsule 40 mg  40 mg Oral Daily Suella Broad, FNP        Musculoskeletal: Strength & Muscle Tone: decreased Gait & Station: unsteady Patient leans: Left            Psychiatric Specialty Exam:  Presentation  General Appearance:  Appropriate for Environment; Casual  Eye Contact: Good (legally blind)  Speech: Clear and Coherent; Normal Rate  Speech Volume: Normal  Handedness: Right   Mood and Affect  Mood: Euthymic  Affect: Appropriate; Congruent   Thought Process  Thought Processes: Linear; Coherent  Descriptions of Associations:Intact  Orientation:Full (Time, Place and Person)  Thought Content:Logical  History of Schizophrenia/Schizoaffective disorder:No data recorded Duration of Psychotic Symptoms:No data recorded Hallucinations:Hallucinations: None (none today)  Ideas of Reference:None  Suicidal Thoughts:No data recorded  Homicidal Thoughts:No data recorded   Sensorium  Memory: Immediate Good; Recent Good; Remote Good  Judgment: Fair  Insight: Fair   Community education officer  Concentration: Fair  Attention Span: Good  Recall: Good  Fund of Knowledge: Fair  Language: Fair   Psychomotor Activity  Psychomotor Activity: No data recorded   Assets  Assets: Communication Skills; Desire for Improvement; Physical Health; Social Support; Catering manager   Sleep  Sleep:  No data recorded   Physical Exam: Physical Exam Vitals and nursing note reviewed.   Constitutional:      Appearance: She is obese.  Skin:    Capillary Refill: Capillary refill takes less than 2 seconds.  Neurological:     General: No focal deficit present.     Mental Status: She is alert and oriented to person, place, and time. Mental status is at baseline.  Psychiatric:        Attention and Perception: Attention normal. She perceives visual (seeing children playing) hallucinations.        Mood and Affect: Mood and affect normal.        Speech: Speech normal.        Behavior: Behavior normal. Behavior is cooperative.        Thought Content: Thought content is delusional (denies).        Cognition and Memory: Cognition is impaired. Memory is impaired.        Judgment: Judgment is impulsive.    Review of Systems  Neurological:        Restless leg  Psychiatric/Behavioral:  Positive for hallucinations. The patient is nervous/anxious.   All other systems reviewed and are negative.  Blood pressure 120/66, pulse 82, temperature 98.6 F (37 C), temperature source Oral, resp. rate 20, height 5' (1.524 m), weight 74.5 kg, SpO2 95 %, unknown if currently breastfeeding. Body mass index is 32.08 kg/m.  Treatment Plan Summary: Daily contact with patient to assess and evaluate symptoms and progress in treatment, Medication management, and Plan WIll recommend inpatient psychiatry admission for medication management, psychiatric evaluation, and stabilization.  -Will resume olanzapine at 5mg  po qhs.   -Will start Ingrezza 40mg  po daily for TD. Labs reviewed and assessed at this time. Patient continues to ruminate about her Requip, she is open to trying Ingrezza in the interm.  -Continue treatment for pneumonia.  Labs ordered and reviewed. B12 low end of normal, recommend providing supplementation at this time. B1 pending  Disposition:  Recommend Inpatient psych admission Currently there is not enough criteria to place patient under IVC however she expresses strong desire to seek  inpatient psychiatric help. Suella Broad, FNP 07/10/2022 3:33 PM

## 2022-07-10 NOTE — Progress Notes (Signed)
The patient spent majority of the night complaining of restless legs and did not sleep much. RN gave PRN Atarax, one time dose of melatonin, and one time dose of Valium to help the patient rest and with anxiety, which did not seem to help. Patient frequently kept sitting on the side of the bed, wanting to stand, and wanting to walk due to the restless legs. Will pass along to dayshift RN.

## 2022-07-11 DIAGNOSIS — R062 Wheezing: Secondary | ICD-10-CM | POA: Insufficient documentation

## 2022-07-11 DIAGNOSIS — H3552 Pigmentary retinal dystrophy: Secondary | ICD-10-CM

## 2022-07-11 DIAGNOSIS — G212 Secondary parkinsonism due to other external agents: Secondary | ICD-10-CM

## 2022-07-11 MED ORDER — MELATONIN 5 MG PO TABS
5.0000 mg | ORAL_TABLET | Freq: Once | ORAL | Status: AC
  Administered 2022-07-12: 5 mg via ORAL
  Filled 2022-07-11: qty 1

## 2022-07-11 MED ORDER — DIAZEPAM 2 MG PO TABS
2.0000 mg | ORAL_TABLET | Freq: Once | ORAL | Status: AC
  Administered 2022-07-12: 2 mg via ORAL
  Filled 2022-07-11: qty 1

## 2022-07-11 NOTE — TOC Progression Note (Signed)
Transition of Care [facility]) - Progression Note    Patient Details  Name: [name] MRN: [id number] Date of Birth: 11-Jun-1946  Transition of [facility]) CM/SW Contact  Otelia Santee, LCSW Phone Number: 07/11/2022, 12:08 PM  Clinical Narrative:    Patient meets criteria for inpatient psychiatric treatment per Caryn Bee, NP. Patient is currently medically stable per Joen Laura, MD. No appropriate beds at [facility] or [facility] currently. CSW faxed referrals to the following facilities for review:  [facility]   [facility]   [facility]   [facility]   [facility]   [facility]   [facility]   [facility]   [facility]   [facility]   [facility]   [facility]   [facility]   [facility]   [facility]  [facility]   [facility]   [facility]   [facility]   [facility]   [facility]      TOC will continue to seek bed placement.        Expected Discharge Plan: Home w Home Health Services Barriers to Discharge: No Barriers Identified  Expected Discharge Plan and Services Expected Discharge Plan: Home w Home Health Services       Living arrangements for the past 2 months: Single Family Home                                       Social Determinants of Health (SDOH) Interventions    Readmission Risk Interventions     No data to display

## 2022-07-11 NOTE — Progress Notes (Signed)
Mobility Specialist - Progress Note   07/11/22 1500  Mobility  Activity Ambulated with assistance in hallway  Level of Assistance Contact guard assist, steadying assist  Assistive Device Front wheel walker  Distance Ambulated (ft) 350 ft  Range of Motion/Exercises Active  Activity Response Tolerated well  Mobility Referral Yes  $Mobility charge 1 Mobility   Pt was found in bed and agreeable to ambulate. Still requiring cues for ambulation and at EOS was left on recliner chair with necessities in reach.   Billey Chang Mobility Specialist

## 2022-07-11 NOTE — Progress Notes (Signed)
  Progress Note   Patient: [name] HGD:[id number] DOB: 1945-09-12 DOA: 07/06/2022     4 DOS: the patient was seen and examined on 07/11/2022 at 10:00AM      Brief hospital course: 76 y.o. female with medical history significant of paroxysmal atrial fibrillation, anxiety, depression, osteoarthritis, chronic diastolic CHF, dyspnea, GERD, hiatal hernia, history of headaches, hyperlipidemia, hypertension, hypothyroidism, legally blind, history of pneumonia, pulmonary hypertension, restless leg syndrome, retinitis pigmentosa who presented due to falling on her left knee twice earlier in the day.  She has also been dyspneic for the past 2 days.  The patient's daughters stated that she had been hallucinating and not making sense sometimes with her conversations.  She recently was paranoid and delusional about her husband being in front of the house talking to another woman.  She was unable to provide a coherent HPI.       Assessment and Plan: Acute respiratory failure with hypoxia due to community-acquired pneumonia Resolving.  Completed 5 days Rocephin.  Patient oriented and alert.  Temp < 100 F, heart rate < 100bpm, RR < 24, SpO2 at baseline.      Paranoid schizophrenia - Continue olanzaine and new valbenazine - Consult psychiatry, appreciate recommendations, plan for transfer from the hospital to inpatient psychiatry given decompensated paranoid schizophrenia, patient is not under IVC  Left knee pain -Continue oxycodone, gabapentin - PT eval  Abnormal leg movements Psychiatry suspect this is actually tardive dyskinesia and not restless leg syndrome Requip may exacerbate hallucinations. 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  Worse with exertion.  Improved with albuterol.  No fever, sputum, swelling, orthopnea.     Physical Exam: BP (!) 140/84   Pulse 79   Temp 98 F (36.7 C) (Oral)   Resp 18   Ht 5' (1.524 m)   Wt 82.2 kg   SpO2 96%   BMI 35.39 kg/m   Elderly adult female, sitting angiotension bed, no acute distress, interactive and appropriate RRR, no murmurs, no peripheral edema Respiratory rate normal, few scattered wheezes, no rales, good air movement Abdomen soft tenderness palpation No ascites or distention Attention normal, affect appropriate, judgment and insight appear normal, some of her thought processing tangential or delusional       Data Reviewed: Comprehensive metabolic panel normal CBC normal    Family Communication: None present    Disposition: Status is: Inpatient The patient was admitted for pneumonia.  From the standpoint of pneumonia she is medically stable for discharge to inpatient psychiatry at any time        Author: Alberteen Sam, MD 07/11/2022 3:23 PM  For on call review [email or web address].

## 2022-07-11 NOTE — Consult Note (Signed)
[facility] Face-to-Face Psychiatry Consult   Reason for Consult:  History of schizophrenia, history of depression.  Admitted for pneumonia.  Daughters had noticed that she has been very forgetful, paranoid, delusional and having hallucinations.  Referring Physician:  Dr. Rhona Leavens Patient Identification: [name] MRN:  [id number] Principal Diagnosis: Acute respiratory failure with hypoxia [facility]) Diagnosis:  Principal Problem:   Acute respiratory failure with hypoxia (HCC) Active Problems:   PAF (paroxysmal atrial fibrillation) (HCC)   Essential hypertension   Hallucinations   Paranoid schizophrenia (HCC)   RLS (restless legs syndrome)   Hypothyroidism   Tardive dyskinesia   Prolonged QT interval   Left knee pain   CAP (community acquired pneumonia)   Total Time spent with patient: 1 hour  Subjective:   [name] is a 76 y.o. female patient admitted with pneumonia and respiratory failure.  On assessment today patient continues to deny any symptoms of depression or anxiety. She notes that the hallucinations and delusions towards her husband have been improving. Patient notes that the intensity and frequency of the hallucinations have remained stable and have not increased since decreasing the Zyprexa to 5 mg.  She endorsed seeing children running and playing, however disappeared when she blinked her eyes. Patient continues to endorse restless legs and has noticed no improvement on the Ingrezza yet. Trishelle is still interested in an inpatient admission and feels that she needs help. She notes that her husband has been very worried about her and is concerned with her returning home. She denies any violent thoughts or active/passive homicidal thoughts. Denies any active or passive suicidal thoughts. Patient has not engaged in any violent or self harming behaviors. She continues to endorse interest in hospitalization at this time and there is no indication for involuntary hospitalization. We  reviewed course of treatment for average of 5-7 days. She will continue to benefit for medication management, stabilization, cognitive behavioral therapy and group setting. Her hallucinations have not improved despite treatment of her pneumonia and urinary tract infection, suggestive of further psychiatric hospitalization.    HPI:  [name] is a 76 y.o. female with medical history significant of paroxysmal atrial fibrillation, anxiety, depression, osteoarthritis, chronic diastolic CHF, dyspnea, GERD, hiatal hernia, history of headaches, hyperlipidemia, hypertension, hypothyroidism, legally blind, history of pneumonia, pulmonary hypertension, restless leg syndrome, retinitis pigmentosa who is coming to the emergency department due to falling on her left knee twice earlier in the day.  She has also been dyspneic for the past 2 days.  The patient's daughters stated that she has been hallucinating and not making sense sometimes with her conversations.  She recently was paranoid and delusional about her husband being in front of the house talking to another woman.  She is unable to provide a coherent HPI.  She is able to answer simple questions.  She is having left knee pain, but no headache, chest or abdominal pain at this time.   Past Psychiatric History: Paranoid Schizophrenia  Risk to Self:   Denies Risk to Others:   Denies Prior Inpatient Therapy:   Denies Prior Outpatient Therapy:   [name], at [facility]  Past Medical History:  Past Medical History:  Diagnosis Date   A-fib (HCC)    PAROXYSMAL  AFIB ON ELIQUIS , MGD BY CARDIO DR Willeen Cass AT [facility]    Anxiety    Arthritis    CHF (congestive heart failure) (HCC)    diastolic Hf   Dyspnea    at times  Dysrhythmia    a fib   Family history of adverse reaction to anesthesia    Daughter hard to wake up   GERD (gastroesophageal reflux disease)    Headache    hx of   History of hiatal hernia    History of kidney stones     Hyperlipidemia    Hypertension    Hypothyroidism    Legally blind    per patient ,patient reports she can only see a blur of images;  needs visual guidance for day to day activities ;    Palpitations    Pneumonia    Pulmonary HTN (HCC)    moderate, see ECHO care everywhere ; patient denies any increases repiratory effort today    Restless leg syndrome    Retinitis pigmentosa     Past Surgical History:  Procedure Laterality Date   EXCISIONAL TOTAL KNEE ARTHROPLASTY WITH ANTIBIOTIC SPACERS Left 11/11/2016   Procedure: Left total knee arthroplasty resection and placement of antibiotic spacer;  Surgeon: Paralee Cancel, MD;  Location: [facility];  Service: Orthopedics;  Laterality: Left;  Adductor Block   EYE SURGERY     cataract extraction   I & D KNEE WITH POLY EXCHANGE Left 12/15/2018   Procedure: IRRIGATION AND DEBRIDEMENT left total knee with poly exchange;  Surgeon: Paralee Cancel, MD;  Location: [facility];  Service: Orthopedics;  Laterality: Left;  80min   KNEE ARTHROPLASTY Left 08/1997   done bu [facility] in [place], [place]   knee arthroplasty Left    x4 revision d/t infection and failed initial arthroplasty   NECK SURGERY  2016   ORIF WRIST FRACTURE  09/2018   METAL IN PLACE    Reimplantatio of Left total knee     03/17/17 Dr. Alvan Dame   REIMPLANTATION OF TOTAL KNEE Left 03/17/2017   Procedure: REIMPLANTATION OF LEFT TOTAL KNEE Arthroplasty;  Surgeon: Paralee Cancel, MD;  Location: [facility];  Service: Orthopedics;  Laterality: Left;   Family History: History reviewed. No pertinent family history. Family Psychiatric  History: Denies Social History:  Social History   Substance and Sexual Activity  Alcohol Use No     Social History   Substance and Sexual Activity  Drug Use No    Social History   Socioeconomic History   Marital status: Married    Spouse name: Not on file   Number of children: Not on file   Years of education: Not on file   Highest education level: Not on file  Occupational  History   Not on file  Tobacco Use   Smoking status: Former    Years: 28.00    Types: Cigarettes   Smokeless tobacco: Never   Tobacco comments:    quit 30 years ago  Vaping Use   Vaping Use: Never used  Substance and Sexual Activity   Alcohol use: No   Drug use: No   Sexual activity: Not Currently  Other Topics Concern   Not on file  Social History Narrative   Not on file   Social Determinants of Health   Financial Resource Strain: Not on file  Food Insecurity: No Food Insecurity (07/07/2022)   Hunger Vital Sign    Worried About Running Out of Food in the Last Year: Never true    Ran Out of Food in the Last Year: Never true  Transportation Needs: No Transportation Needs (07/07/2022)   PRAPARE - Hydrologist (Medical): No    Lack of Transportation (Non-Medical): No  Physical  Activity: Not on file  Stress: Not on file  Social Connections: Not on file   Additional Social History:    Allergies:   Allergies  Allergen Reactions   Nickel Rash   Other Rash and Other (See Comments)    METAL.  HAS HAD PROBLEMS WITH KNEE REPLACEMENT.  CANNOT WEAR COSTUME JEWELRY - CAUSES RASH/OOZE/ ITCH.  Allergen: Metals  Reaction: Hives, itching, rash, infection  Metal  Pt states other meds she cannot think of   Sulfamethoxazole-Trimethoprim Hives and Rash   Deutetrabenazine     Other Reaction(s): Other  "didn't work and was too expensive"   Pramipexole     "didn't work"   Statins Nausea And Vomiting and Rash    Makes her sick on stomach  Makes her sick on stomach  Makes her sick on stomach  Makes her sick on stomach  Joints ached   Amiodarone Other (See Comments)    Restless legs  Other Reaction(s): Not available   Metoprolol Other (See Comments)    Restless legs  Other Reaction(s): Not available   Benadryl [Diphenhydramine Hcl] Hives and Rash   Doxycycline Diarrhea    Labs:  Results for orders placed or performed during the hospital  encounter of 07/06/22 (from the past 48 hour(s))  HIV Antibody (routine testing w rflx)     Status: None   Collection Time: 07/09/22 12:45 PM  Result Value Ref Range   HIV Screen 4th Generation wRfx Non Reactive Non Reactive    Comment: Performed at [facility], [address] [place], [place] [address]  Comprehensive metabolic panel     Status: Abnormal   Collection Time: 07/10/22  8:08 AM  Result Value Ref Range   Sodium 138 135 - 145 mmol/L   Potassium 4.6 3.5 - 5.1 mmol/L   Chloride 104 98 - 111 mmol/L   CO2 24 22 - 32 mmol/L   Glucose, Bld 131 (H) 70 - 99 mg/dL    Comment: Glucose reference range applies only to samples taken after fasting for at least 8 hours.   BUN 13 8 - 23 mg/dL   Creatinine, Ser 3.29 0.44 - 1.00 mg/dL   Calcium 9.0 8.9 - 51.8 mg/dL   Total Protein 7.2 6.5 - 8.1 g/dL   Albumin 3.6 3.5 - 5.0 g/dL   AST 22 15 - 41 U/L   ALT 17 0 - 44 U/L   Alkaline Phosphatase 84 38 - 126 U/L   Total Bilirubin 0.6 0.3 - 1.2 mg/dL   GFR, Estimated >84 >16 mL/min    Comment: (NOTE) Calculated using the CKD-EPI Creatinine Equation (2021)    Anion gap 10 5 - 15    Comment: Performed at [name], [address] [place], [place] [address]  CBC     Status: Abnormal   Collection Time: 07/10/22  8:08 AM  Result Value Ref Range   WBC 11.1 (H) 4.0 - 10.5 K/uL   RBC 4.03 3.87 - 5.11 MIL/uL   Hemoglobin 12.0 12.0 - 15.0 g/dL   HCT 16.0 10.9 - 32.3 %   MCV 96.5 80.0 - 100.0 fL   MCH 29.8 26.0 - 34.0 pg   MCHC 30.8 30.0 - 36.0 g/dL   RDW 55.7 32.2 - 02.5 %   Platelets 284 150 - 400 K/uL   nRBC 0.0 0.0 - 0.2 %    Comment: Performed at [facility], [address], [place], [place] [address]    Current Facility-Administered  Medications  Medication Dose Route Frequency Provider Last Rate Last Admin   acetaminophen (TYLENOL) tablet 650 mg  650 mg Oral Q6H PRN Howerter, Justin B, DO   650 mg at 07/07/22 1521   Or   acetaminophen  (TYLENOL) suppository 650 mg  650 mg Rectal Q6H PRN Howerter, Justin B, DO       albuterol (PROVENTIL) (2.5 MG/3ML) 0.083% nebulizer solution 2.5 mg  2.5 mg Nebulization Q4H PRN Reubin Milan, MD   2.5 mg at 07/11/22 0809   amiodarone (PACERONE) tablet 200 mg  200 mg Oral BID Reubin Milan, MD   200 mg at 07/11/22 [id number]   apixaban (ELIQUIS) tablet 5 mg  5 mg Oral BID Howerter, Justin B, DO   5 mg at 07/11/22 0919   benztropine (COGENTIN) tablet 1 mg  1 mg Oral QHS Reubin Milan, MD   1 mg at 07/09/22 2138   carbidopa-levodopa (SINEMET IR) 25-100 MG per tablet immediate release 1 tablet  1 tablet Oral 3 times per day Reubin Milan, MD   1 tablet at 07/11/22 0755   DULoxetine (CYMBALTA) DR capsule 60 mg  60 mg Oral Daily Reubin Milan, MD   60 mg at 07/11/22 0919   furosemide (LASIX) tablet 40 mg  40 mg Oral Daily Reubin Milan, MD   40 mg at 07/11/22 0919   gabapentin (NEURONTIN) capsule 300 mg  300 mg Oral TID Reubin Milan, MD   300 mg at 07/11/22 0919   hydrOXYzine (ATARAX) tablet 25 mg  25 mg Oral Daily PRN Reubin Milan, MD   25 mg at 07/10/22 2206   levothyroxine (SYNTHROID) tablet 88 mcg  88 mcg Oral QAC breakfast Reubin Milan, MD   88 mcg at 07/11/22 [id number]   loperamide (IMODIUM) capsule 2 mg  2 mg Oral PRN Edwin Dada, MD   2 mg at 07/10/22 1312   metoprolol succinate (TOPROL-XL) 24 hr tablet 25 mg  25 mg Oral Daily Reubin Milan, MD   25 mg at 07/11/22 0918   OLANZapine (ZYPREXA) tablet 5 mg  5 mg Oral QHS Suella Broad, FNP   5 mg at 07/10/22 2207   Oral care mouth rinse  15 mL Mouth Rinse 4 times per day Reubin Milan, MD   15 mL at 07/11/22 1154   Oral care mouth rinse  15 mL Mouth Rinse PRN Reubin Milan, MD       oxyCODONE-acetaminophen (PERCOCET/ROXICET) 5-325 MG per tablet 1 tablet  1 tablet Oral TID PRN Tawnya Crook, RPH   1 tablet at 07/11/22 [id number]   And   oxyCODONE (Oxy IR/ROXICODONE)  immediate release tablet 5 mg  5 mg Oral TID PRN Tawnya Crook, RPH   5 mg at 07/11/22 0610   pantoprazole (PROTONIX) EC tablet 40 mg  40 mg Oral QHS Reubin Milan, MD   40 mg at 07/10/22 2207   potassium chloride (KLOR-CON M) CR tablet 10 mEq  10 mEq Oral Daily Reubin Milan, MD   10 mEq at 07/11/22 [id number]   valbenazine (INGREZZA) capsule 40 mg  40 mg Oral Daily Suella Broad, FNP   40 mg at 07/11/22 0919    Musculoskeletal: Strength & Muscle Tone: decreased Gait & Station: unsteady Patient leans: Left            Psychiatric Specialty Exam:  Presentation  General Appearance:  Appropriate for Environment; Casual  Eye Contact: Good (  legally blind)  Speech: Clear and Coherent; Normal Rate  Speech Volume: Normal  Handedness: Right   Mood and Affect  Mood: Euthymic  Affect: Appropriate; Congruent   Thought Process  Thought Processes: Linear; Coherent  Descriptions of Associations:Intact  Orientation:Full (Time, Place and Person)  Thought Content:Logical  History of Schizophrenia/Schizoaffective disorder:No data recorded Duration of Psychotic Symptoms:No data recorded Hallucinations:No data recorded  Ideas of Reference:None  Suicidal Thoughts:No data recorded  Homicidal Thoughts:No data recorded   Sensorium  Memory: Immediate Good; Recent Good; Remote Good  Judgment: Fair  Insight: Fair   Community education officer  Concentration: Fair  Attention Span: Good  Recall: Good  Fund of Knowledge: Fair  Language: Fair   Psychomotor Activity  Psychomotor Activity: No data recorded   Assets  Assets: Communication Skills; Desire for Improvement; Physical Health; Social Support; Catering manager   Sleep  Sleep: No data recorded   Physical Exam: Physical Exam Vitals and nursing note reviewed.  Constitutional:      Appearance: She is obese.  Skin:    Capillary Refill: Capillary refill  takes less than 2 seconds.  Neurological:     General: No focal deficit present.     Mental Status: She is alert and oriented to person, place, and time. Mental status is at baseline.  Psychiatric:        Attention and Perception: Attention normal. She perceives visual (seeing children playing) hallucinations.        Mood and Affect: Mood and affect normal.        Speech: Speech normal.        Behavior: Behavior normal. Behavior is cooperative.        Thought Content: Thought content is delusional (denies).        Cognition and Memory: Cognition is impaired. Memory is impaired.        Judgment: Judgment is impulsive.   Review of Systems  Neurological:        Restless leg  Psychiatric/Behavioral:  Positive for hallucinations. The patient is nervous/anxious.   All other systems reviewed and are negative.  Blood pressure (!) 147/82, pulse (!) 103, temperature 98.2 F (36.8 C), temperature source Oral, resp. rate 20, height 5' (1.524 m), weight 82.2 kg, SpO2 96 %, unknown if currently breastfeeding. Body mass index is 35.39 kg/m.  Treatment Plan Summary: Daily contact with patient to assess and evaluate symptoms and progress in treatment, Medication management, and Plan WIll recommend inpatient psychiatry admission for medication management, psychiatric evaluation, and stabilization.  -Will continue olanzapine at 5mg  po qhs.   -Will continue Ingrezza 40mg  po daily for TD. Labs reviewed and assessed. -Continue treatment for pneumonia.  Labs ordered and reviewed. B12 low end of normal, recommend providing supplementation at this time. B1 pending  Disposition:  Recommend Inpatient psych admission and bed search has been started Currently there is not enough criteria to place patient under IVC however she expresses strong desire to seek inpatient psychiatric help. Vista Mink, MD 07/11/2022 12:08 PM

## 2022-07-11 NOTE — Progress Notes (Signed)
Mobility Specialist - Progress Note   07/11/22 1000  Mobility  Activity Ambulated with assistance in hallway;Ambulated with assistance to bathroom  Level of Assistance Contact guard assist, steadying assist  Assistive Device Front wheel walker  Distance Ambulated (ft) 350 ft  Range of Motion/Exercises Active  Activity Response Tolerated well  Mobility Referral Yes  $Mobility charge 1 Mobility   Pt was found in bed and agreeable to ambulate. During ambulation stated that her left leg felt better. She is still needing verbal cues for ambulation to avoid hitting the wall when turning. At EOS returned to bed with necessities in reach.  Billey Chang Mobility Specialist

## 2022-07-11 NOTE — Plan of Care (Signed)

## 2022-07-12 DIAGNOSIS — E538 Deficiency of other specified B group vitamins: Secondary | ICD-10-CM | POA: Insufficient documentation

## 2022-07-12 LAB — COMPREHENSIVE METABOLIC PANEL
ALT: 6 U/L (ref 0–44)
AST: 17 U/L (ref 15–41)
Albumin: 3.7 g/dL (ref 3.5–5.0)
Alkaline Phosphatase: 89 U/L (ref 38–126)
Anion gap: 10 (ref 5–15)
BUN: 14 mg/dL (ref 8–23)
CO2: 26 mmol/L (ref 22–32)
Calcium: 9.2 mg/dL (ref 8.9–10.3)
Chloride: 102 mmol/L (ref 98–111)
Creatinine, Ser: 0.87 mg/dL (ref 0.44–1.00)
GFR, Estimated: >60 mL/min (ref >60)
Glucose, Bld: 120 mg/dL — ABNORMAL HIGH (ref 70–99)
Potassium: 3.7 mmol/L (ref 3.5–5.1)
Sodium: 138 mmol/L (ref 135–145)
Total Bilirubin: 0.7 mg/dL (ref 0.3–1.2)
Total Protein: 7.2 g/dL (ref 6.5–8.1)

## 2022-07-12 MED ORDER — VALBENAZINE TOSYLATE 60 MG PO CAPS
60.0000 mg | ORAL_CAPSULE | Freq: Every day | ORAL | Status: DC
  Administered 2022-07-13 – 2022-07-15 (×3): 60 mg via ORAL
  Filled 2022-07-12 (×4): qty 1

## 2022-07-12 MED ORDER — MELATONIN 5 MG PO TABS
5.0000 mg | ORAL_TABLET | Freq: Once | ORAL | Status: AC
  Administered 2022-07-12: 5 mg via ORAL
  Filled 2022-07-12: qty 1

## 2022-07-12 MED ORDER — LORAZEPAM 2 MG/ML IJ SOLN
INTRAMUSCULAR | Status: AC
  Filled 2022-07-12: qty 1

## 2022-07-12 MED ORDER — VITAMIN B-12 100 MCG PO TABS
500.0000 ug | ORAL_TABLET | Freq: Every day | ORAL | Status: DC
  Administered 2022-07-12 – 2022-07-15 (×4): 500 ug via ORAL
  Filled 2022-07-12 (×3): qty 5

## 2022-07-12 MED ORDER — LORAZEPAM 2 MG/ML IJ SOLN
0.5000 mg | INTRAMUSCULAR | Status: AC
  Administered 2022-07-12: 0.5 mg via INTRAVENOUS

## 2022-07-12 MED ORDER — LORAZEPAM 2 MG/ML IJ SOLN
0.5000 mg | Freq: Once | INTRAMUSCULAR | Status: AC
  Administered 2022-07-12: 0.5 mg via INTRAVENOUS
  Filled 2022-07-12: qty 1

## 2022-07-12 NOTE — Assessment & Plan Note (Addendum)
Psychiatry consulted with recommendation for inpatient behavioral admission. Requip and Cogentin stopped with possible improvement in symptoms. Patient decreased to Zyprexa 5 mg daily and started on valbenazine. Patient developed delirium with benzodiazepines. Recommendation to avoid benzodiazepines. Psychiatry with recommendations for Zyprexa 5 mg daily and Ingrezza 60 mg daily

## 2022-07-12 NOTE — Assessment & Plan Note (Addendum)
Orthopedic surgery consulted. Low concern for septic arthritis. Symptoms improved. Continue oxycodone and gabapentin.

## 2022-07-12 NOTE — Progress Notes (Signed)
Daughters at bedside has a lot of concerns about medicines for the pt's restless leg. They are also concern about starting the pt on Ingrezza. Sent a secure chat with psyche NP. Will pass it on to the next shift RN not to give the Ingrezza per daughters' request. Also not to give any sedating medicines per daughters' request.

## 2022-07-12 NOTE — Consult Note (Addendum)
[facility] Face-to-Face Psychiatry Consult   Reason for Consult:  History of schizophrenia, history of depression.  Admitted for pneumonia.  Daughters had noticed that she has been very forgetful, paranoid, delusional and having hallucinations.  Referring Physician:  Dr. Rhona Leavens Patient Identification: [name] MRN:  [id number] Principal Diagnosis: Acute respiratory failure with hypoxia [facility]) Diagnosis:  Principal Problem:   Acute respiratory failure with hypoxia (HCC) Active Problems:   Obesity (BMI 30-39.9)   PAF (paroxysmal atrial fibrillation) (HCC)   Essential hypertension   Paranoid schizophrenia (HCC)   RLS (restless legs syndrome)   Hypothyroidism   Tardive dyskinesia   Prolonged QT interval   Left knee pain   CAP (community acquired pneumonia)   Secondary parkinsonism   Wheezing   Retinitis pigmentosa   Total Time spent with patient: 1 hour  Subjective:   [name] is a 76 y.o. female patient admitted with pneumonia and respiratory failure.  On today's assessment, patient presents alert and oriented x 2.  She is observed to be sitting up in her bedside chair, and eating breakfast.  She does appear to be somnolent, and has difficulty arousing and remaining awake at times.  Exam is notable for somnolence, disorientation, that appear markedly different than her baseline. Other times during the interview she is observed to be nodding off, and falling over. This appears to be response to benzodiazepines patient received overnight for restless leg, and worsening anxiety.  Patient denies any acute side effects and/or adverse reactions to olanzapine 5 mg p.o. nightly.  Patient was previously on 10 mg p.o. nightly, prior to admission however symptoms persisted after several days of hospitalization, and which her home medication was restarted at a lower dose.  Patient continues to ruminate about restless leg syndrome, and inquires about restarting her Requip.  She continues to lack  much insight and judgment, as her main focus is her Requip.  Did discuss with patient Requip can cause worsening cognitive impairment as such her benzodiazepine she received overnight.  Patient continues to disagree.  She continues to endorse visual hallucinations.   HPI:  [name] is a 76 y.o. female with medical history significant of paroxysmal atrial fibrillation, anxiety, depression, osteoarthritis, chronic diastolic CHF, dyspnea, GERD, hiatal hernia, history of headaches, hyperlipidemia, hypertension, hypothyroidism, legally blind, history of pneumonia, pulmonary hypertension, restless leg syndrome, retinitis pigmentosa who is coming to the emergency department due to falling on her left knee twice earlier in the day.  She has also been dyspneic for the past 2 days.  The patient's daughters stated that she has been hallucinating and not making sense sometimes with her conversations.  She recently was paranoid and delusional about her husband being in front of the house talking to another woman.  She is unable to provide a coherent HPI.  She is able to answer simple questions.  She is having left knee pain, but no headache, chest or abdominal pain at this time.   Past Psychiatric History: Paranoid Schizophrenia  Risk to Self:   Denies Risk to Others:   Denies Prior Inpatient Therapy:   Denies Prior Outpatient Therapy:   [name], at [facility]  Past Medical History:  Past Medical History:  Diagnosis Date   A-fib (HCC)    PAROXYSMAL  AFIB ON ELIQUIS , MGD BY CARDIO DR [facility]    Anxiety    Arthritis    CHF (congestive heart failure) (HCC)    diastolic Hf   Dyspnea  at times   Dysrhythmia    a fib   Family history of adverse reaction to anesthesia    Daughter hard to wake up   GERD (gastroesophageal reflux disease)    Headache    hx of   History of hiatal hernia    History of kidney stones    Hyperlipidemia    Hypertension    Hypothyroidism    Legally  blind    per patient ,patient reports she can only see a blur of images;  needs visual guidance for day to day activities ;    Palpitations    Pneumonia    Pulmonary HTN (HCC)    moderate, see ECHO care everywhere ; patient denies any increases repiratory effort today    Restless leg syndrome    Retinitis pigmentosa     Past Surgical History:  Procedure Laterality Date   EXCISIONAL TOTAL KNEE ARTHROPLASTY WITH ANTIBIOTIC SPACERS Left 11/11/2016   Procedure: Left total knee arthroplasty resection and placement of antibiotic spacer;  Surgeon: Durene RomansMatthew Olin, MD;  Location: [facility];  Service: Orthopedics;  Laterality: Left;  Adductor Block   EYE SURGERY     cataract extraction   I & D KNEE WITH POLY EXCHANGE Left 12/15/2018   Procedure: IRRIGATION AND DEBRIDEMENT left total knee with poly exchange;  Surgeon: Durene Romanslin, Matthew, MD;  Location: [facility];  Service: Orthopedics;  Laterality: Left;  90min   KNEE ARTHROPLASTY Left 08/1997   done bu [facility] in [place]   knee arthroplasty Left    x4 revision d/t infection and failed initial arthroplasty   NECK SURGERY  2016   ORIF WRIST FRACTURE  09/2018   METAL IN PLACE    Reimplantatio of Left total knee     03/17/17 Dr. Charlann Boxerlin   REIMPLANTATION OF TOTAL KNEE Left 03/17/2017   Procedure: REIMPLANTATION OF LEFT TOTAL KNEE Arthroplasty;  Surgeon: Durene Romanslin, Matthew, MD;  Location: [facility];  Service: Orthopedics;  Laterality: Left;   Family History: History reviewed. No pertinent family history. Family Psychiatric  History: Denies Social History:  Social History   Substance and Sexual Activity  Alcohol Use No     Social History   Substance and Sexual Activity  Drug Use No    Social History   Socioeconomic History   Marital status: Married    Spouse name: Not on file   Number of children: Not on file   Years of education: Not on file   Highest education level: Not on file  Occupational History   Not on file  Tobacco Use   Smoking status: Former     Years: 28.00    Types: Cigarettes   Smokeless tobacco: Never   Tobacco comments:    quit 30 years ago  Vaping Use   Vaping Use: Never used  Substance and Sexual Activity   Alcohol use: No   Drug use: No   Sexual activity: Not Currently  Other Topics Concern   Not on file  Social History Narrative   Not on file   Social Determinants of Health   Financial Resource Strain: Not on file  Food Insecurity: No Food Insecurity (07/07/2022)   Hunger Vital Sign    Worried About Running Out of Food in the Last Year: Never true    Ran Out of Food in the Last Year: Never true  Transportation Needs: No Transportation Needs (07/07/2022)   PRAPARE - Administrator, Civil ServiceTransportation    Lack of Transportation (Medical): No    Lack of Transportation (  Non-Medical): No  Physical Activity: Not on file  Stress: Not on file  Social Connections: Not on file   Additional Social History:    Allergies:   Allergies  Allergen Reactions   Nickel Rash   Other Rash and Other (See Comments)    METAL.  HAS HAD PROBLEMS WITH KNEE REPLACEMENT.  CANNOT WEAR COSTUME JEWELRY - CAUSES RASH/OOZE/ ITCH.  Allergen: Metals  Reaction: Hives, itching, rash, infection  Metal  Pt states other meds she cannot think of   Sulfamethoxazole-Trimethoprim Hives and Rash   Deutetrabenazine     Other Reaction(s): Other  "didn't work and was too expensive"   Pramipexole     "didn't work"   Statins Nausea And Vomiting and Rash    Makes her sick on stomach  Makes her sick on stomach  Makes her sick on stomach  Makes her sick on stomach  Joints ached   Amiodarone Other (See Comments)    Restless legs  Other Reaction(s): Not available   Metoprolol Other (See Comments)    Restless legs  Other Reaction(s): Not available   Benadryl [Diphenhydramine Hcl] Hives and Rash   Doxycycline Diarrhea    Labs:  No results found for this or any previous visit (from the past 48 hour(s)).   Current Facility-Administered Medications   Medication Dose Route Frequency Provider Last Rate Last Admin   acetaminophen (TYLENOL) tablet 650 mg  650 mg Oral Q6H PRN Howerter, Justin B, DO   650 mg at 07/07/22 1521   Or   acetaminophen (TYLENOL) suppository 650 mg  650 mg Rectal Q6H PRN Howerter, Justin B, DO       albuterol (PROVENTIL) (2.5 MG/3ML) 0.083% nebulizer solution 2.5 mg  2.5 mg Nebulization Q4H PRN Bobette Mo, MD   2.5 mg at 07/11/22 0809   amiodarone (PACERONE) tablet 200 mg  200 mg Oral BID Bobette Mo, MD   200 mg at 07/12/22 0844   apixaban (ELIQUIS) tablet 5 mg  5 mg Oral BID Howerter, Justin B, DO   5 mg at 07/12/22 0843   benztropine (COGENTIN) tablet 1 mg  1 mg Oral QHS Bobette Mo, MD   1 mg at 07/11/22 2111   carbidopa-levodopa (SINEMET IR) 25-100 MG per tablet immediate release 1 tablet  1 tablet Oral 3 times per day Bobette Mo, MD   1 tablet at 07/12/22 0843   DULoxetine (CYMBALTA) DR capsule 60 mg  60 mg Oral Daily Bobette Mo, MD   60 mg at 07/12/22 0844   furosemide (LASIX) tablet 40 mg  40 mg Oral Daily Bobette Mo, MD   40 mg at 07/12/22 0844   gabapentin (NEURONTIN) capsule 300 mg  300 mg Oral TID Bobette Mo, MD   300 mg at 07/12/22 8295   hydrOXYzine (ATARAX) tablet 25 mg  25 mg Oral Daily PRN Bobette Mo, MD   25 mg at 07/12/22 0843   levothyroxine (SYNTHROID) tablet 88 mcg  88 mcg Oral QAC breakfast Bobette Mo, MD   88 mcg at 07/12/22 6213   loperamide (IMODIUM) capsule 2 mg  2 mg Oral PRN Alberteen Sam, MD   2 mg at 07/10/22 1312   LORazepam (ATIVAN) 2 MG/ML injection            metoprolol succinate (TOPROL-XL) 24 hr tablet 25 mg  25 mg Oral Daily Bobette Mo, MD   25 mg at 07/12/22 0844   OLANZapine (ZYPREXA) tablet 5  mg  5 mg Oral QHS Maryagnes Amos, FNP   5 mg at 07/11/22 2111   Oral care mouth rinse  15 mL Mouth Rinse 4 times per day Bobette Mo, MD   15 mL at 07/11/22 2111   Oral care mouth  rinse  15 mL Mouth Rinse PRN Bobette Mo, MD       oxyCODONE-acetaminophen (PERCOCET/ROXICET) 5-325 MG per tablet 1 tablet  1 tablet Oral TID PRN Pricilla Riffle, RPH   1 tablet at 07/12/22 9562   And   oxyCODONE (Oxy IR/ROXICODONE) immediate release tablet 5 mg  5 mg Oral TID PRN Pricilla Riffle, RPH   5 mg at 07/12/22 0125   pantoprazole (PROTONIX) EC tablet 40 mg  40 mg Oral QHS Bobette Mo, MD   40 mg at 07/11/22 2111   potassium chloride (KLOR-CON M) CR tablet 10 mEq  10 mEq Oral Daily Bobette Mo, MD   10 mEq at 07/12/22 0844   [START ON 07/13/2022] valbenazine (INGREZZA) capsule 60 mg  60 mg Oral Daily Maryagnes Amos, FNP        Musculoskeletal: Strength & Muscle Tone: decreased Gait & Station: unsteady Patient leans: Left   Psychiatric Specialty Exam:  Presentation  General Appearance:  Appropriate for Environment; Casual  Eye Contact: Good (legally blind)  Speech: Clear and Coherent; Normal Rate  Speech Volume: Normal  Handedness: Right   Mood and Affect  Mood: Euthymic  Affect: Appropriate; Congruent   Thought Process  Thought Processes: Linear; Coherent  Descriptions of Associations:Intact  Orientation:Partial  Thought Content:Logical; Rumination  History of Schizophrenia/Schizoaffective disorder:No data recorded Duration of Psychotic Symptoms:No data recorded Hallucinations:Hallucinations: Visual Description of Visual Hallucinations: children running around   Ideas of Reference:None  Suicidal Thoughts:Suicidal Thoughts: No   Homicidal Thoughts:Homicidal Thoughts: No    Sensorium  Memory: Immediate Good; Recent Good; Remote Good  Judgment: Fair  Insight: Shallow   Executive Functions  Concentration: Fair  Attention Span: Fair  Recall: Fair  Fund of Knowledge: Fair  Language: Fair   Psychomotor Activity  Psychomotor Activity: Psychomotor Activity: Normal    Assets   Assets: Communication Skills; Desire for Improvement; Social Support; Resilience; Financial Resources/Insurance   Sleep  Sleep: Sleep: Fair   Physical Exam: Physical Exam Vitals and nursing note reviewed.  Constitutional:      Appearance: She is obese.  Skin:    Capillary Refill: Capillary refill takes less than 2 seconds.  Neurological:     General: No focal deficit present.     Mental Status: She is alert. Mental status is at baseline.  Psychiatric:        Attention and Perception: Attention normal. She perceives visual (seeing children playing) hallucinations.        Mood and Affect: Mood and affect normal.        Speech: Speech normal.        Behavior: Behavior normal. Behavior is cooperative.        Thought Content: Thought content is delusional (denies).        Cognition and Memory: Cognition is impaired. Memory is impaired.        Judgment: Judgment is impulsive.    Review of Systems  Neurological:        Restless leg  Psychiatric/Behavioral:  Positive for hallucinations. The patient is nervous/anxious.   All other systems reviewed and are negative.  Blood pressure (!) 148/89, pulse 100, temperature (!) 97.5 F (36.4 C), temperature source Oral,  resp. rate 18, height 5' (1.524 m), weight 71.1 kg, SpO2 94 %, unknown if currently breastfeeding. Body mass index is 30.61 kg/m.  Treatment Plan Summary: Daily contact with patient to assess and evaluate symptoms and progress in treatment, Medication management, and Plan WIll recommend inpatient psychiatry admission for medication management, psychiatric evaluation, and stabilization.  -Will continue olanzapine at 5mg  po qhs.   -Will increase Ingrezza 60mg  po daily for TD. Labs reviewed and assessed. Will repeat CMP.   Labs ordered and reviewed. B12 low end of normal, recommend providing supplementation at this time.  -Consider delirium precautions.   -Careful management of pharmacological therapy, space-time  reorientation, early mobilization, minimization of restraint use, and adequate sleep hygiene is the most recommended option for preventing delirium in hospitalized patients.  -During this time period, minimization of delirogenic insults will be of utmost importance; this includes promoting the normal circadian cycle, minimizing lines/tubes, avoiding deliriogenic medications such as benzodiazepines and anticholinergic medications, and frequently reorienting the patient.  Disposition:  Recommend Inpatient psych admission and bed search has been started  Currently there is not enough criteria to place patient under IVC however she expresses strong desire to seek inpatient psychiatric help.   , FNP 07/12/2022 10:12 AM

## 2022-07-12 NOTE — Progress Notes (Signed)
Pt woke up disoriented and having hallucinations. Pt is impulsive likes to move around the room and very high risk for fall. She said she will go home and can just walk without being transported by fam. Pt is not easily redirected and will say things that is nonsense. Will cont chair/bed alarm on.

## 2022-07-12 NOTE — TOC Progression Note (Addendum)
Transition of Care [facility]) - Progression Note    Patient Details  Name: MAJESTY STEHLIN MRN: [id number] Date of Birth: 1946/01/08  [facility] CM/SW Contact  Golda Acre, RN Phone Number: 07/12/2022, 9:39 AM  Clinical Narrative:    [facility]. [facility] [place]-no beds  Expected Discharge Plan: Home w Home Health Services Barriers to Discharge: No Barriers Identified  Expected Discharge Plan and Services Expected Discharge Plan: Home w Home Health Services       Living arrangements for the past 2 months: Single Family Home                                       Social Determinants of Health (SDOH) Interventions    Readmission Risk Interventions   No data to display

## 2022-07-12 NOTE — Assessment & Plan Note (Addendum)
Continue metoprolol. 
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
## 2022-07-12 NOTE — Assessment & Plan Note (Addendum)
Continue Synthroid °

## 2022-07-12 NOTE — Assessment & Plan Note (Addendum)
Patient is on Sinemet and Requip as an outpatient. Requip held. -Continue Sinemet

## 2022-07-12 NOTE — Assessment & Plan Note (Addendum)
History of atrial thrombus. Patient is managed on Eliquis, amiodarone and metoprolol. Continue home regimen.

## 2022-07-12 NOTE — Assessment & Plan Note (Addendum)
Estimated body mass index is 30.54 kg/m as calculated from the following:   Height as of this encounter: 5' (1.524 m).   Weight as of this encounter: 70.9 kg.

## 2022-07-12 NOTE — Assessment & Plan Note (Addendum)
Continue Vitamin B 12 supplementation

## 2022-07-12 NOTE — Progress Notes (Signed)
Mobility Specialist - Progress Note   07/12/22 1532  Mobility  Activity Ambulated with assistance in hallway  Level of Assistance Contact guard assist, steadying assist  Assistive Device Front wheel walker  Distance Ambulated (ft) 300 ft  Range of Motion/Exercises Active  Activity Response Tolerated well  Mobility Referral Yes  $Mobility charge 1 Mobility   Pt was found in bathroom and agreeable to ambulate. Was very confused during ambulation asking "how far is this breakfast?". She stated she was cold also and at EOS returned to recliner chair with necessities in reach and chair alarm on.  Billey Chang Mobility Specialist

## 2022-07-12 NOTE — Progress Notes (Signed)
Patient starts to complain of restless legs and appears anxious around 10pm after I gave her her night time medicines. Nurse on duty gave her some Atarax for this but she said it didn't help her. Liana Crocker NP was informed of this and ordered Valium and Melatonin. After an hour, patient is still complaining of restlessness of legs, pacing in the room and became emotional for wanting this to stop. Nurse referred the patient to A.Virgel Manifold NP and ordered Ativan 0.5mg  initially and gave her another 0.5mg  IV when patient complain again of restlessness of legs.  A few minutes after the second dose of Ativan, patient became disoriented, trying to get out of the bed, forgot that I already gave her Ativan and had hallucination (talking to someone that is not present in the room). After patient was able to sleep. Nurse made sure that the patient is safe at all times. Will continue to monitor her RR while sleeping.

## 2022-07-12 NOTE — Assessment & Plan Note (Addendum)
Albuterol as needed provided.

## 2022-07-12 NOTE — Assessment & Plan Note (Signed)
Resolved.  Completed 5 days Rocephin.

## 2022-07-12 NOTE — Assessment & Plan Note (Signed)
See above

## 2022-07-12 NOTE — Assessment & Plan Note (Signed)
Secondary to pneumonia. Resolved.

## 2022-07-12 NOTE — Assessment & Plan Note (Signed)
Legally blind 

## 2022-07-12 NOTE — Assessment & Plan Note (Addendum)
Patient previously on Requip which was discontinued secondary to concern for contributing to confusion. Patient started on valbenazine

## 2022-07-12 NOTE — Progress Notes (Signed)
       CROSS COVER NOTE  NAME: [name] MRN: [id number] DOB : 07/11/1946    Date of Service   07/12/2022   HPI/Events of Note   Notified by bedside RN of patient complaining of restless legs and anxiety.  Patient did not gain relief from PRN Atarax.  Patient is currently on Cogentin and valbenazine for restless legs.  Per documentation, Requip was discontinued because it might have been exacerbating her hallucinations.  Initially melatonin and low-dose Valium were ordered for anxiety and sleep.  RN reported no changes in patient's anxious state.  Instead, patient is becoming more restless, pacing in the room, and has become emotionally tearful at not being able to have control over her body.   During bedside assessment, patient is alert and oriented.  Although she is tearful and agitated, she is able to properly converse with me regarding her medications.  She endorses her frustration over not being able to have Requip.  Will order IV Ativan to assist patient with anxiety.  Patient denies adverse effects to Ativan from previous use.  Overall, a total of 1 mg IV Ativan was given tonight.  0530-RN endorses patient had mild hallucination after being given Ativan.  Patient is now asleep and no other acute complications have been reported by RN.   Interventions/ Plan   P.o. Valium IV Ativan-        Anthoney Harada, DNP, ACNPC- AG [facility]

## 2022-07-13 LAB — CBC
HCT: 38.2 % (ref 36.0–46.0)
Hemoglobin: 11.6 g/dL — ABNORMAL LOW (ref 12.0–15.0)
MCH: 29.1 pg (ref 26.0–34.0)
MCHC: 30.4 g/dL (ref 30.0–36.0)
MCV: 96 fL (ref 80.0–100.0)
Platelets: 286 10*3/uL (ref 150–400)
RBC: 3.98 MIL/uL (ref 3.87–5.11)
RDW: 14.6 % (ref 11.5–15.5)
WBC: 8.9 10*3/uL (ref 4.0–10.5)
nRBC: 0 % (ref 0.0–0.2)

## 2022-07-13 LAB — BASIC METABOLIC PANEL
Anion gap: 10 (ref 5–15)
BUN: 16 mg/dL (ref 8–23)
CO2: 25 mmol/L (ref 22–32)
Calcium: 8.8 mg/dL — ABNORMAL LOW (ref 8.9–10.3)
Chloride: 103 mmol/L (ref 98–111)
Creatinine, Ser: 0.82 mg/dL (ref 0.44–1.00)
GFR, Estimated: >60 mL/min (ref >60)
Glucose, Bld: 110 mg/dL — ABNORMAL HIGH (ref 70–99)
Potassium: 3.6 mmol/L (ref 3.5–5.1)
Sodium: 138 mmol/L (ref 135–145)

## 2022-07-13 MED ORDER — HYDROXYZINE HCL 25 MG PO TABS
12.5000 mg | ORAL_TABLET | Freq: Once | ORAL | Status: AC
  Administered 2022-07-13: 12.5 mg via ORAL
  Filled 2022-07-13: qty 1

## 2022-07-13 MED ORDER — LIP MEDEX EX OINT
1.0000 | TOPICAL_OINTMENT | CUTANEOUS | Status: DC | PRN
  Administered 2022-07-13: 1 via TOPICAL
  Filled 2022-07-13: qty 7
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
## 2022-07-13 NOTE — Progress Notes (Signed)
Patient complaining of restless leg. RN made daniels, NP aware. Atarax  given at 2117.  At 2230 patient resting in bed with eyes closed. CNA came to RN at 2300 at nurses station and stated she just got patient up to the bathroom and assisted her back to bed and patient is requesting pain medications and something for restless leg. This RN stated she had already given patient atarax and that in the doctors notes we are to hold off on benzos. This RN went to give patient PRN pain medication as requested. When this RN and CNA entered the room Patient is upset stating "I heard what yall said about me and that you guys were making fun of me, Its ok when my family gets here I will tell them what you said and they will report you" This RN and CNA explained to patient that no one had made fun of her and that we were at the nurses station discussing that you needed pain medication and what options we had for the restless leg. Patient continued to say "I am going to report you, I dont want you back in my room" RN and CNA educated patient on safety risk and to press call bell before getting up to prevent falls and RN educated patient on the need for her care team including her CNA to communicate concerns and questions and needs of patients with the RN to better take care of the patient. CN made aware of situation. Bed alarm on and call bell in reach.

## 2022-07-14 DIAGNOSIS — I480 Paroxysmal atrial fibrillation: Secondary | ICD-10-CM

## 2022-07-14 DIAGNOSIS — E039 Hypothyroidism, unspecified: Secondary | ICD-10-CM

## 2022-07-14 DIAGNOSIS — E538 Deficiency of other specified B group vitamins: Secondary | ICD-10-CM

## 2022-07-14 DIAGNOSIS — F200 Paranoid schizophrenia: Secondary | ICD-10-CM

## 2022-07-14 DIAGNOSIS — H3552 Pigmentary retinal dystrophy: Secondary | ICD-10-CM

## 2022-07-14 DIAGNOSIS — I10 Essential (primary) hypertension: Secondary | ICD-10-CM

## 2022-07-14 DIAGNOSIS — I700 Atherosclerosis of aorta: Secondary | ICD-10-CM | POA: Insufficient documentation

## 2022-07-14 DIAGNOSIS — G212 Secondary parkinsonism due to other external agents: Secondary | ICD-10-CM

## 2022-07-14 NOTE — Consult Note (Signed)
[facility] Face-to-Face Psychiatry Consult   Reason for Consult:  History of schizophrenia, history of depression.  Admitted for pneumonia.  Daughters had noticed that she has been very forgetful, paranoid, delusional and having hallucinations.  Referring Physician:  Dr. Rhona Leavens Patient Identification: [name] MRN:  [id number] Principal Diagnosis: Acute respiratory failure with hypoxia [facility]) Diagnosis:  Principal Problem:   Acute respiratory failure with hypoxia (HCC) Active Problems:   Obesity (BMI 30-39.9)   PAF (paroxysmal atrial fibrillation) (HCC)   Essential hypertension   Paranoid schizophrenia (HCC)   RLS (restless legs syndrome)   Hypothyroidism   Tardive dyskinesia   Prolonged QT interval   Left knee pain   CAP (community acquired pneumonia)   Secondary parkinsonism   Wheezing   Retinitis pigmentosa   Vitamin B12 deficiency   Total Time spent with patient: 30 minutes   Subjective:   [name] is a 76 y.o. female patient admitted with pneumonia and respiratory failure.  On today's assessment, patient is seen sleeping in bed but easily arouses to voice.  She reports she is doing well although has continued to struggle intermittently with restless legs.  Currently, denying restless leg sensation and states she was still able to sleep well last night.  She denies depressed mood or significant anxiety.  Denies visual hallucinations recently and feels it has been a few days since she last experienced this.  Denies SI, HI, or auditory hallucinations.  This Clinical research associate inquired into incident last night in which she felt a member of the nursing staff was making fun of her - she states she overheard a nurse talking about her in the hall.  Outside of this episode, however, she denies concerns that other staff is talking about her or out to get her and feels she has been treated overall well.  Feels safe in the hospital.  This writer inquired into overall rapport with husband and she  reports they have been getting along well.  She does not provide delusional thought content.  Asked about prior beliefs that he had been talking to another woman and she states she has felt less bothered by this recently.  She states she is not sure if it was happening although cannot say it was not happening.  Discussed ongoing plan of care.  She states she was previously interested in psychiatric hospitalization to help with visual hallucinations although given recent improvement she is not sure what she would hope hospitalization would help her with.  She may be open to considering outpatient management but would like more time to think about this and discuss with family.  Discussed that we will check in with her tomorrow to continue these discussions and assess best level of care.  Alert and oriented x 4.  Attention testing intact.  HPI:  [name] is a 76 y.o. female with medical history significant of paroxysmal atrial fibrillation, anxiety, depression, osteoarthritis, chronic diastolic CHF, dyspnea, GERD, hiatal hernia, history of headaches, hyperlipidemia, hypertension, hypothyroidism, legally blind, history of pneumonia, pulmonary hypertension, restless leg syndrome, retinitis pigmentosa who is coming to the emergency department due to falling on her left knee twice earlier in the day.  She had also been dyspneic for the past 2 days.  The patient's daughters stated that she has been hallucinating and not making sense sometimes with her conversations.  She recently was paranoid and delusional about her husband being in front of the house talking to another woman.  She was unable to provide a coherent  HPI and only able to answer simple questions.  Past Psychiatric History: Paranoid Schizophrenia  Risk to Self:   Denies Risk to Others:   Denies Prior Inpatient Therapy:   Denies Prior Outpatient Therapy:   [facility]  Past Medical History:  Past Medical History:   Diagnosis Date   A-fib (HCC)    PAROXYSMAL  AFIB ON ELIQUIS , MGD BY CARDIO DR Willeen CassWAHID AT [facility]    Anxiety    Arthritis    CHF (congestive heart failure) (HCC)    diastolic Hf   Dyspnea    at times   Dysrhythmia    a fib   Family history of adverse reaction to anesthesia    Daughter hard to wake up   GERD (gastroesophageal reflux disease)    Headache    hx of   History of hiatal hernia    History of kidney stones    Hyperlipidemia    Hypertension    Hypothyroidism    Legally blind    per patient ,patient reports she can only see a blur of images;  needs visual guidance for day to day activities ;    Palpitations    Pneumonia    Pulmonary HTN (HCC)    moderate, see ECHO care everywhere ; patient denies any increases repiratory effort today    Restless leg syndrome    Retinitis pigmentosa     Past Surgical History:  Procedure Laterality Date   EXCISIONAL TOTAL KNEE ARTHROPLASTY WITH ANTIBIOTIC SPACERS Left 11/11/2016   Procedure: Left total knee arthroplasty resection and placement of antibiotic spacer;  Surgeon: Durene RomansMatthew Olin, MD;  Location: [facility];  Service: Orthopedics;  Laterality: Left;  Adductor Block   EYE SURGERY     cataract extraction   I & D KNEE WITH POLY EXCHANGE Left 12/15/2018   Procedure: IRRIGATION AND DEBRIDEMENT left total knee with poly exchange;  Surgeon: Durene Romanslin, Matthew, MD;  Location: [facility];  Service: Orthopedics;  Laterality: Left;  90min   KNEE ARTHROPLASTY Left 08/1997   done [facility] in [place], [place]   knee arthroplasty Left    x4 revision d/t infection and failed initial arthroplasty   NECK SURGERY  2016   ORIF WRIST FRACTURE  09/2018   METAL IN PLACE    Reimplantatio of Left total knee     03/17/17 Dr. Charlann Boxerlin   REIMPLANTATION OF TOTAL KNEE Left 03/17/2017   Procedure: REIMPLANTATION OF LEFT TOTAL KNEE Arthroplasty;  Surgeon: Durene Romanslin, Matthew, MD;  Location: [facility];  Service: Orthopedics;  Laterality: Left;   Family History: History reviewed. No  pertinent family history. Family Psychiatric  History: Denies Social History:  Social History   Substance and Sexual Activity  Alcohol Use No     Social History   Substance and Sexual Activity  Drug Use No    Social History   Socioeconomic History   Marital status: Married    Spouse name: Not on file   Number of children: Not on file   Years of education: Not on file   Highest education level: Not on file  Occupational History   Not on file  Tobacco Use   Smoking status: Former    Years: 28.00    Types: Cigarettes   Smokeless tobacco: Never   Tobacco comments:    quit 30 years ago  Vaping Use   Vaping Use: Never used  Substance and Sexual Activity   Alcohol use: No   Drug use: No  Sexual activity: Not Currently  Other Topics Concern   Not on file  Social History Narrative   Not on file   Social Determinants of Health   Financial Resource Strain: Not on file  Food Insecurity: No Food Insecurity (07/07/2022)   Hunger Vital Sign    Worried About Running Out of Food in the Last Year: Never true    Ran Out of Food in the Last Year: Never true  Transportation Needs: No Transportation Needs (07/07/2022)   PRAPARE - Administrator, Civil Service (Medical): No    Lack of Transportation (Non-Medical): No  Physical Activity: Not on file  Stress: Not on file  Social Connections: Not on file   Additional Social History:    Allergies:   Allergies  Allergen Reactions   Nickel Rash   Other Rash and Other (See Comments)    METAL.  HAS HAD PROBLEMS WITH KNEE REPLACEMENT.  CANNOT WEAR COSTUME JEWELRY - CAUSES RASH/OOZE/ ITCH.  Allergen: Metals  Reaction: Hives, itching, rash, infection  Metal  Pt states other meds she cannot think of   Sulfamethoxazole-Trimethoprim Hives and Rash   Deutetrabenazine     Other Reaction(s): Other  "didn't work and was too expensive"   Pramipexole     "didn't work"   Statins Nausea And Vomiting and Rash    Makes her  sick on stomach  Makes her sick on stomach  Makes her sick on stomach  Makes her sick on stomach  Joints ached   Amiodarone Other (See Comments)    Restless legs  Other Reaction(s): Not available   Metoprolol Other (See Comments)    Restless legs  Other Reaction(s): Not available   Benadryl [Diphenhydramine Hcl] Hives and Rash   Doxycycline Diarrhea    Labs:  Results for orders placed or performed during the hospital encounter of 07/06/22 (from the past 48 hour(s))  Comprehensive metabolic panel     Status: Abnormal   Collection Time: 07/12/22 10:05 AM  Result Value Ref Range   Sodium 138 135 - 145 mmol/L   Potassium 3.7 3.5 - 5.1 mmol/L   Chloride 102 98 - 111 mmol/L   CO2 26 22 - 32 mmol/L   Glucose, Bld 120 (H) 70 - 99 mg/dL    Comment: Glucose reference range applies only to samples taken after fasting for at least 8 hours.   BUN 14 8 - 23 mg/dL   Creatinine, Ser 9.14 0.44 - 1.00 mg/dL   Calcium 9.2 8.9 - 78.2 mg/dL   Total Protein 7.2 6.5 - 8.1 g/dL   Albumin 3.7 3.5 - 5.0 g/dL   AST 17 15 - 41 U/L   ALT 6 0 - 44 U/L   Alkaline Phosphatase 89 38 - 126 U/L   Total Bilirubin 0.7 0.3 - 1.2 mg/dL   GFR, Estimated >95 >62 mL/min    Comment: (NOTE) Calculated using the CKD-EPI Creatinine Equation (2021)    Anion gap 10 5 - 15    Comment: Performed at [facility], [address], [place], [place] [address]  CBC     Status: Abnormal   Collection Time: 07/13/22  7:41 AM  Result Value Ref Range   WBC 8.9 4.0 - 10.5 K/uL   RBC 3.98 3.87 - 5.11 MIL/uL   Hemoglobin 11.6 (L) 12.0 - 15.0 g/dL   HCT 57.8 46.9 - 62.9 %   MCV 96.0 80.0 - 100.0 fL   MCH 29.1 26.0 - 34.0 pg  MCHC 30.4 30.0 - 36.0 g/dL   RDW 24.5 80.9 - 98.3 %   Platelets 286 150 - 400 K/uL   nRBC 0.0 0.0 - 0.2 %    Comment: Performed at [facility], [address] [place], [place] [address]  Basic metabolic panel     Status: Abnormal   Collection Time: 07/13/22   7:41 AM  Result Value Ref Range   Sodium 138 135 - 145 mmol/L   Potassium 3.6 3.5 - 5.1 mmol/L   Chloride 103 98 - 111 mmol/L   CO2 25 22 - 32 mmol/L   Glucose, Bld 110 (H) 70 - 99 mg/dL    Comment: Glucose reference range applies only to samples taken after fasting for at least 8 hours.   BUN 16 8 - 23 mg/dL   Creatinine, Ser 5.39 0.44 - 1.00 mg/dL   Calcium 8.8 (L) 8.9 - 10.3 mg/dL   GFR, Estimated >76 >73 mL/min    Comment: (NOTE) Calculated using the CKD-EPI Creatinine Equation (2021)    Anion gap 10 5 - 15    Comment: Performed at [facility], [address], [place], [place] [address]     Current Facility-Administered Medications  Medication Dose Route Frequency Provider Last Rate Last Admin   acetaminophen (TYLENOL) tablet 650 mg  650 mg Oral Q6H PRN Howerter, Justin B, DO   650 mg at 07/07/22 1521   Or   acetaminophen (TYLENOL) suppository 650 mg  650 mg Rectal Q6H PRN Howerter, Justin B, DO       albuterol (PROVENTIL) (2.5 MG/3ML) 0.083% nebulizer solution 2.5 mg  2.5 mg Nebulization Q4H PRN Bobette Mo, MD   2.5 mg at 07/11/22 0809   amiodarone (PACERONE) tablet 200 mg  200 mg Oral BID Bobette Mo, MD   200 mg at 07/13/22 2117   apixaban (ELIQUIS) tablet 5 mg  5 mg Oral BID Howerter, Justin B, DO   5 mg at 07/13/22 2117   carbidopa-levodopa (SINEMET IR) 25-100 MG per tablet immediate release 1 tablet  1 tablet Oral 3 times per day Bobette Mo, MD   1 tablet at 07/13/22 2117   DULoxetine (CYMBALTA) DR capsule 60 mg  60 mg Oral Daily Bobette Mo, MD   60 mg at 07/13/22 1023   furosemide (LASIX) tablet 40 mg  40 mg Oral Daily Bobette Mo, MD   40 mg at 07/13/22 1023   gabapentin (NEURONTIN) capsule 300 mg  300 mg Oral TID Bobette Mo, MD   300 mg at 07/13/22 2116   hydrOXYzine (ATARAX) tablet 25 mg  25 mg Oral Daily PRN Bobette Mo, MD   25 mg at 07/13/22 1022   levothyroxine (SYNTHROID) tablet 88 mcg   88 mcg Oral QAC breakfast Bobette Mo, MD   88 mcg at 07/14/22 9024   lip balm (CARMEX) ointment 1 Application  1 Application Topical PRN Luiz Iron, NP   1 Application at 07/13/22 2120   loperamide (IMODIUM) capsule 2 mg  2 mg Oral PRN Alberteen Sam, MD   2 mg at 07/10/22 1312   metoprolol succinate (TOPROL-XL) 24 hr tablet 25 mg  25 mg Oral Daily Bobette Mo, MD   25 mg at 07/13/22 1023   OLANZapine (ZYPREXA) tablet 5 mg  5 mg Oral QHS Maryagnes Amos, FNP   5 mg at 07/13/22 2117   Oral care mouth rinse  15 mL Mouth Rinse  4 times per day Bobette Mo, MD   15 mL at 07/13/22 2117   Oral care mouth rinse  15 mL Mouth Rinse PRN Bobette Mo, MD       oxyCODONE-acetaminophen (PERCOCET/ROXICET) 5-325 MG per tablet 1 tablet  1 tablet Oral TID PRN Pricilla Riffle, RPH   1 tablet at 07/13/22 2244   And   oxyCODONE (Oxy IR/ROXICODONE) immediate release tablet 5 mg  5 mg Oral TID PRN Pricilla Riffle, RPH   5 mg at 07/13/22 2244   pantoprazole (PROTONIX) EC tablet 40 mg  40 mg Oral QHS Bobette Mo, MD   40 mg at 07/13/22 2117   potassium chloride (KLOR-CON M) CR tablet 10 mEq  10 mEq Oral Daily Bobette Mo, MD   10 mEq at 07/13/22 1022   valbenazine (INGREZZA) capsule 60 mg  60 mg Oral Daily Maryagnes Amos, FNP   60 mg at 07/13/22 1021   vitamin B-12 (CYANOCOBALAMIN) tablet 500 mcg  500 mcg Oral Daily Alberteen Sam, MD   500 mcg at 07/13/22 1141    Musculoskeletal: Strength & Muscle Tone: decreased Gait & Station: unsteady Patient leans: Left   Psychiatric Specialty Exam:  Presentation  General Appearance:  Appropriate for Environment; Casual  Eye Contact: Good (legally blind)  Speech: Clear and Coherent; Normal Rate  Speech Volume: Normal  Handedness: Right   Mood and Affect  Mood: Euthymic  Affect: Appropriate; Congruent   Thought Process  Thought Processes: Linear;  Coherent  Descriptions of Associations:Intact  Orientation: Oriented to person, place, date, situation.  Thought Content: Logical; no overt delusional content on interview. When asked about prior delusional thought content towards husband, she reports being less bothered by this recently and unsure if it happened or not.   Hallucinations: Denies VH for the past 2 days .Denies AH.   Ideas of Reference:None  Suicidal Thoughts: denies   Homicidal Thoughts: denies    Sensorium  Memory: Immediate Good; Recent Good; Remote Good  Judgment: Fair  Insight: Improving   Executive Functions  Concentration: Fair  Attention Span: Able to spell WORLD backward without error.  Recall: Jennelle Human of Knowledge: Fair  Language: Fair  Psychomotor Activity  Psychomotor Activity: Normal  Assets  Assets: Manufacturing systems engineer; Desire for Improvement; Social Support; Resilience; Financial Resources/Insurance   Sleep  Sleep: fair   Physical Exam: Physical Exam Vitals and nursing note reviewed.  Constitutional:      Appearance: She is obese.  Neurological:     General: No focal deficit present.     Mental Status: She is alert. Mental status is at baseline.  Psychiatric:        Attention and Perception: Attention normal. Visual hallucinations: seeing children playing.        Mood and Affect: Mood and affect normal.        Speech: Speech normal.        Behavior: Behavior normal. Behavior is cooperative.        Thought Content: Delusional: denies.     Comments: Denies VH today; does not volunteer delusional thought content on interview    ROS: continues to report intermittent restless legs  Blood pressure 120/73, pulse 86, temperature 98 F (36.7 C), temperature source Oral, resp. rate 20, height 5' (1.524 m), weight 70.8 kg, SpO2 93 %, unknown if currently breastfeeding. Body mass index is 30.47 kg/m.  Treatment Plan Summary: Daily contact with patient to assess  and evaluate symptoms and progress  in treatment, Medication management, and Plan Previously considering inpatient psychiatry admission however will plan to re-assess tomorrow given improvement in behavior, cognition, AH and delusional thought content over the weekend  -Will continue olanzapine at 5mg  po qhs.   -Will continue Ingrezza 60mg  po daily for TD. Labs reviewed and assessed.  -Schedule melatonin nightly for circadian rhythm regulation  Labs reviewed. Continue B12 supplementation.  -Continue delirium precautions.   -Careful management of pharmacological therapy, space-time reorientation, early mobilization, minimization of restraint use, and adequate sleep hygiene is the most recommended option for preventing delirium in hospitalized patients.  -During this time period, minimization of delirogenic insults will be of utmost importance; this includes promoting the normal circadian cycle, minimizing lines/tubes, avoiding deliriogenic medications such as benzodiazepines and anticholinergic medications, and frequently reorienting the patient.  Disposition: Previously considering inpatient psychiatry admission - discussed with inpatient team and no geropsych beds available today.  However, will plan to re-assess appropriateness of inpatient hospitalization tomorrow given improvement in behavior, cognition, AH and delusional thought content over the weekend. Patient appears open to either hospitalization or discharge home with outpatient psychiatric care at this time although will want to first ensure ongoing stability of psychiatric symptoms and involve family in disposition discussion. It is not felt that patient meets criteria for involuntary commitment at this time.   [facility] A  07/14/2022 8:26 AM

## 2022-07-14 NOTE — Plan of Care (Signed)

## 2022-07-14 NOTE — Progress Notes (Signed)
PROGRESS NOTE    NILAH PAPALIA  [id number] DOB: 02-21-1946 DOA: 07/06/2022 PCP: Kathreen Devoid, PA-C   Brief Narrative: QUINNLYNN PEEK is a 76 y.o. female with a history of paroxysmal atrial fibrillation on Eliquis, major depression with psychotic features.,  Delusional disorder, dizziness hallucinations, diastolic heart failure, hypertension, hypothyroidism, retinitis pigmentosa, tardive dyskinesia.  Patient presented secondary to a fall with reports of worsening visual hallucinations.  Psychiatry consulted and medications have been adjusted.  And also with commune acquired pneumonia which responded well to antibiotic therapy.  Respiratory failure resolved with treatment of pneumonia.  Recommendation for inpatient psychiatry evaluation/admission.   Assessment and Plan: * Acute respiratory failure with hypoxia (HCC)-resolved as of 07/14/2022 Secondary to pneumonia. Resolved.  CAP (community acquired pneumonia) CT chest was significant for severe multilobar bilateral bronchopneumonia.  Patient was treated with ceftriaxone and azithromycin/doxycycline and completed a 5-day course with improvement in symptoms.  Recommendation for repeat CT chest in 2 months.  Paranoid schizophrenia [facility]) Psychiatry consulted with recommendation for inpatient behavioral admission. Requip and Cogentin stopped with possible improvement in symptoms. Patient decreased to Zyprexa 5 mg daily and started on valbenazine. Patient developed delirium with benzodiazepines. 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 Requip held. -Continue Sinemet  Vitamin B12 deficiency -Continue Vitamin B12  Retinitis pigmentosa Legally blind.  Wheezing -Continue albuterol PRN  Left knee pain Orthopedic surgery consulted. Low concern for septic arthritis. Symptoms improved. -Continue oxycodone and gabapentin  Tardive dyskinesia Patient started on  valbenazine  Hypothyroidism -Continue Synthroid  RLS (restless legs syndrome) Patient previously on Requip which was discontinued secondary to concern for contributing to confusion. Patient started on valbenazine  Essential hypertension -Continue metoprolol  PAF (paroxysmal atrial fibrillation) (HCC) History of atrial thrombus. Patient is managed on Eliquis and metoprolol. -Continue metoprolol -Continue Eliquis  Obesity (BMI 30-39.9) Estimated body mass index is 30.47 kg/m as calculated from the following:   Height as of this encounter: 5' (1.524 m).   Weight as of this encounter: 70.8 kg.  Prolonged QT interval-resolved as of 07/14/2022 Resolved on most recent EKG.    DVT prophylaxis: Eliquis Code Status:   Code Status: Full Code Family Communication: None at bedside Disposition Plan: Discharge pending bed availability   Consultants:  Psychiatry  Procedures:  None  Antimicrobials: Ceftriaxone Azithromycin Doxycycline    Subjective: Patient reports continued issues with restless leg. Today, she has symptoms of her right leg. Patient reports an issue with staff overnight, but no other medical issues.  Objective: BP 120/73 (BP Location: Right Arm)   Pulse 86   Temp 98 F (36.7 C) (Oral)   Resp 20   Ht 5' (1.524 m)   Wt 70.8 kg   SpO2 93%   BMI 30.47 kg/m   Examination:  General exam: Appears calm and comfortable Respiratory system: Clear to auscultation. Respiratory effort normal. Cardiovascular system: S1 & S2 heard. Irregular rhythm, normal rate. No murmurs. Gastrointestinal system: Abdomen is nondistended, soft and nontender. Normal bowel sounds heard. Central nervous system: Alert and oriented. No focal neurological deficits. Musculoskeletal: No edema. No calf tenderness Skin: No cyanosis. No rashes   Data Reviewed: I have personally reviewed following labs and imaging studies  CBC Lab Results  Component Value Date   WBC 8.9 07/13/2022    RBC 3.98 07/13/2022   HGB 11.6 (L) 07/13/2022   HCT 38.2 07/13/2022   MCV 96.0 07/13/2022   MCH 29.1 07/13/2022   PLT 286 07/13/2022  MCHC 30.4 07/13/2022   RDW 14.6 07/13/2022   LYMPHSABS 1.3 07/07/2022   MONOABS 0.6 07/07/2022   EOSABS 0.3 07/07/2022   BASOSABS 0.1 07/07/2022     Last metabolic panel Lab Results  Component Value Date   NA 138 07/13/2022   K 3.6 07/13/2022   CL 103 07/13/2022   CO2 25 07/13/2022   BUN 16 07/13/2022   CREATININE 0.82 07/13/2022   GLUCOSE 110 (H) 07/13/2022   GFRNONAA >60 07/13/2022   GFRAA 53 (L) 12/18/2018   CALCIUM 8.8 (L) 07/13/2022   PROT 7.2 07/12/2022   ALBUMIN 3.7 07/12/2022   BILITOT 0.7 07/12/2022   ALKPHOS 89 07/12/2022   AST 17 07/12/2022   ALT 6 07/12/2022   ANIONGAP 10 07/13/2022    GFR: Estimated Creatinine Clearance: 50.4 mL/min (by C-G formula based on SCr of 0.82 mg/dL).  Recent Results (from the past 240 hour(s))  Resp Panel by RT-PCR (Flu A&B, Covid) Anterior Nasal Swab     Status: None   Collection Time: 07/07/22  3:36 AM   Specimen: Anterior Nasal Swab  Result Value Ref Range Status   SARS Coronavirus 2 by RT PCR NEGATIVE NEGATIVE Final    Comment: (NOTE) SARS-CoV-2 target nucleic acids are NOT DETECTED.  The SARS-CoV-2 RNA is generally detectable in upper respiratory specimens during the acute phase of infection. The lowest concentration of SARS-CoV-2 viral copies this assay can detect is 138 copies/mL. A negative result does not preclude SARS-Cov-2 infection and should not be used as the sole basis for treatment or other patient management decisions. A negative result may occur with  improper specimen collection/handling, submission of specimen other than nasopharyngeal swab, presence of viral mutation(s) within the areas targeted by this assay, and inadequate number of viral copies(<138 copies/mL). A negative result must be combined with clinical observations, patient history, and  epidemiological information. The expected result is Negative.  Fact Sheet for Patients:  BloggerCourse.com  Fact Sheet for Healthcare Providers:  SeriousBroker.it  This test is no t yet approved or cleared by the [place] FDA and  has been authorized for detection and/or diagnosis of SARS-CoV-2 by FDA under an Emergency Use Authorization (EUA). This EUA will remain  in effect (meaning this test can be used) for the duration of the COVID-19 declaration under Section 564(b)(1) of the Act, 21 U.S.C.section 360bbb-3(b)(1), unless the authorization is terminated  or revoked sooner.       Influenza A by PCR NEGATIVE NEGATIVE Final   Influenza B by PCR NEGATIVE NEGATIVE Final    Comment: (NOTE) The Xpert Xpress SARS-CoV-2/FLU/RSV plus assay is intended as an aid in the diagnosis of influenza from Nasopharyngeal swab specimens and should not be used as a sole basis for treatment. Nasal washings and aspirates are unacceptable for Xpert Xpress SARS-CoV-2/FLU/RSV testing.  Fact Sheet for Patients: BloggerCourse.com  Fact Sheet for Healthcare Providers: SeriousBroker.it  This test is not yet approved or cleared by the [place] FDA and has been authorized for detection and/or diagnosis of SARS-CoV-2 by FDA under an Emergency Use Authorization (EUA). This EUA will remain in effect (meaning this test can be used) for the duration of the COVID-19 declaration under Section 564(b)(1) of the Act, 21 U.S.C. section 360bbb-3(b)(1), unless the authorization is terminated or revoked.  Performed at [facility], [address], [place], [place] [address]       Radiology Studies: No results found.    LOS: 7 days    Jacquelin Hawking, MD [facility] 07/14/2022, 10:27 AM   If 7PM-7AM, please contact night-coverage [email or web address]

## 2022-07-14 NOTE — Assessment & Plan Note (Signed)
Patient is managed on

## 2022-07-15 ENCOUNTER — Other Ambulatory Visit (HOSPITAL_COMMUNITY): Payer: Self-pay

## 2022-07-15 MED ORDER — VALBENAZINE TOSYLATE 60 MG PO CAPS
60.0000 mg | ORAL_CAPSULE | Freq: Every day | ORAL | 0 refills | Status: DC
  Filled 2022-07-15 – 2022-07-17 (×3): qty 30, 30d supply, fill #0

## 2022-07-15 MED ORDER — VALBENAZINE TOSYLATE 60 MG PO CAPS
60.0000 mg | ORAL_CAPSULE | Freq: Every day | ORAL | 0 refills | Status: DC
  Filled 2022-07-15: qty 30, 30d supply, fill #0

## 2022-07-15 MED ORDER — OLANZAPINE 5 MG PO TABS
5.0000 mg | ORAL_TABLET | Freq: Every day | ORAL | 0 refills | Status: DC
  Filled 2022-07-15: qty 30, 30d supply, fill #0

## 2022-07-15 MED ORDER — VITAMIN B-12 100 MCG PO TABS: 100.0000 ug | ORAL_TABLET | Freq: Every day | ORAL | Status: DC

## 2022-07-15 NOTE — Progress Notes (Signed)
Mobility Specialist - Progress Note   07/15/22 1300  Mobility  Activity Ambulated with assistance in hallway  Level of Assistance Contact guard assist, steadying assist  Assistive Device Front wheel walker  Distance Ambulated (ft) 700 ft  Range of Motion/Exercises Active  Activity Response Tolerated well  Mobility Referral Yes  $Mobility charge 1 Mobility   Pt was found in bed and agreeable to ambulate. Needs cues when turning corners and had no complaints while ambulating. At EOS returned to bed with necessities in reach and bed alarm on.  [name] Mobility Specialist

## 2022-07-15 NOTE — Consult Note (Cosign Needed Addendum)
[facility] Face-to-Face Psychiatry Consult   Reason for Consult:  History of schizophrenia, history of depression.  Admitted for pneumonia.  Daughters had noticed that she has been very forgetful, paranoid, delusional and having hallucinations.  Referring Physician:  Dr. Wyline Copas Patient Identification: [name] MRN:  [id number] Principal Diagnosis: Acute respiratory failure with hypoxia [facility]) Diagnosis:  Active Problems:   Obesity (BMI 30-39.9)   PAF (paroxysmal atrial fibrillation) (HCC)   Essential hypertension   Paranoid schizophrenia (HCC)   RLS (restless legs syndrome)   Hypothyroidism   Tardive dyskinesia   Left knee pain   CAP (community acquired pneumonia)   Secondary parkinsonism   Wheezing   Retinitis pigmentosa   Vitamin B12 deficiency   Aortic atherosclerosis ([place])   Total Time spent with patient: 30 minutes   Subjective:   [name] is a 76 y.o. female patient admitted with pneumonia and respiratory failure.  On evaluation patient is alert and oriented, calm and cooperative, very pleasant upon approach.  Patient has recollection at this time of recent events leading up to her hospitalization. Patient does present with some obvious mild cognitive impairments, as she continues to be very repetitive, forgetful (unaware that she stated facts previously ), forgetting important events.  She is noted to be on the phone with her husband and offers to tell him that she is discharging.  She endorses psychiatric stability, with current dose of olanzapine 5 mg.  She is very appreciative and thankful, for the care that she has received by psychiatric team during his hospitalization.  She is able to show insight into her current condition, by noting her improvement in his symptoms with Ingrezza versus Requip.  She is no longer ruminating about her Requip, and restless leg syndrome.  She furthermore appears open to seeking outpatient therapy, for management of coping skills, behavior  modifications.  She reports improvement in her sleep and fair appetite.  She reports 1 mild episode of restless leg syndrome yesterday, however resolved with no interventions.  At the present time she denies any suicidal ideations, homicidal ideations, and or nonsuicidal self-injurious behavior.  Patient denies any auditory and/or visual hallucinations, does not appear to be responding to internal or external stimuli. There is no evidence of delusional thought content and patient appears to answer all questions appropriately.  There appear to be no barriers to discharging home.   Writer was successful in contacting daughter Lattie Haw, with patient's permission.  Did discuss with daughter patient's history of schizoaffective disorder, depressive type; and suspicion for developing of mouth cognitive impairment.  We did review medications to include olanzapine and Ingrezza.  Daughter endorse symptoms of involuntary chin movement with initiation of a psychiatric medication, however unable to recall the name of it.  We were able to review a little further in depth regarding patient's suspected diagnosis of tardive dyskinesia that include oral labial movement, upper and lower extremity movement, ocular movement, and other systems.  Further clarification was provided for Ingrezza treating tardive dyskinesia and all the systems, versus patient taking propanol and Cogentin, and Sinemet.  We discussed going forward recommend patient continue follow-up with neurology for management of those medications listed above, and dementia evaluation.  Daughter is encouraged to not wait until symptoms progress, and unable to take care of mother in the home she is encouraged to start services at this time, sooner than later.  All questions, comments, and concerns were addressed.  Writer spent a total of 25 minutes seeking collateral information, chart review with  outpatient providers, and review of medication and discharge instructions with  daughter.  HPI:  [name] is a 76 y.o. female with medical history significant of paroxysmal atrial fibrillation, anxiety, depression, osteoarthritis, chronic diastolic CHF, dyspnea, GERD, hiatal hernia, history of headaches, hyperlipidemia, hypertension, hypothyroidism, legally blind, history of pneumonia, pulmonary hypertension, restless leg syndrome, retinitis pigmentosa who is coming to the emergency department due to falling on her left knee twice earlier in the day.  She had also been dyspneic for the past 2 days.  The patient's daughters stated that she has been hallucinating and not making sense sometimes with her conversations.  She recently was paranoid and delusional about her husband being in front of the house talking to another woman.  She was unable to provide a coherent HPI and only able to answer simple questions.  Past Psychiatric History: Paranoid Schizophrenia  Risk to Self:   Denies Risk to Others:   Denies Prior Inpatient Therapy:   Denies Prior Outpatient Therapy:   Doyle Askew, at [facility] IM clinic  Past Medical History:  Past Medical History:  Diagnosis Date   A-fib ([place])    PAROXYSMAL  AFIB ON ELIQUIS , MGD BY CARDIO DR Gerarda Gunther AT [facility]    Anxiety    Arthritis    CHF (congestive heart failure) (HCC)    diastolic Hf   Dyspnea    at times   Dysrhythmia    a fib   Family history of adverse reaction to anesthesia    Daughter hard to wake up   GERD (gastroesophageal reflux disease)    Headache    hx of   History of hiatal hernia    History of kidney stones    Hyperlipidemia    Hypertension    Hypothyroidism    Legally blind    per patient ,patient reports she can only see a blur of images;  needs visual guidance for day to day activities ;    Palpitations    Pneumonia    Pulmonary HTN (HCC)    moderate, see ECHO care everywhere ; patient denies any increases repiratory effort today    Restless leg syndrome    Retinitis pigmentosa     Past Surgical  History:  Procedure Laterality Date   EXCISIONAL TOTAL KNEE ARTHROPLASTY WITH ANTIBIOTIC SPACERS Left 11/11/2016   Procedure: Left total knee arthroplasty resection and placement of antibiotic spacer;  Surgeon: Paralee Cancel, MD;  Location: [facility];  Service: Orthopedics;  Laterality: Left;  Adductor Block   EYE SURGERY     cataract extraction   I & D KNEE WITH POLY EXCHANGE Left 12/15/2018   Procedure: IRRIGATION AND DEBRIDEMENT left total knee with poly exchange;  Surgeon: Paralee Cancel, MD;  Location: [facility];  Service: Orthopedics;  Laterality: Left;  11min   KNEE ARTHROPLASTY Left 08/1997   done bu [place] in [place], [place]   knee arthroplasty Left    x4 revision d/t infection and failed initial arthroplasty   NECK SURGERY  2016   ORIF WRIST FRACTURE  09/2018   METAL IN PLACE    Reimplantatio of Left total knee     03/17/17 Dr. Alvan Dame   REIMPLANTATION OF TOTAL KNEE Left 03/17/2017   Procedure: REIMPLANTATION OF LEFT TOTAL KNEE Arthroplasty;  Surgeon: Paralee Cancel, MD;  Location: [facility];  Service: Orthopedics;  Laterality: Left;   Family History: History reviewed. No pertinent family history. Family Psychiatric  History: Denies Social History:  Social History   Substance and Sexual Activity  Alcohol Use No     Social History   Substance and Sexual Activity  Drug Use No    Social History   Socioeconomic History   Marital status: Married    Spouse name: Not on file   Number of children: Not on file   Years of education: Not on file   Highest education level: Not on file  Occupational History   Not on file  Tobacco Use   Smoking status: Former    Years: 28.00    Types: Cigarettes   Smokeless tobacco: Never   Tobacco comments:    quit 30 years ago  Vaping Use   Vaping Use: Never used  Substance and Sexual Activity   Alcohol use: No   Drug use: No   Sexual activity: Not Currently  Other Topics Concern   Not on file  Social History Narrative   Not on file   Social  Determinants of Health   Financial Resource Strain: Not on file  Food Insecurity: No Food Insecurity (07/07/2022)   Hunger Vital Sign    Worried About Running Out of Food in the Last Year: Never true    Ran Out of Food in the Last Year: Never true  Transportation Needs: No Transportation Needs (07/07/2022)   PRAPARE - Hydrologist (Medical): No    Lack of Transportation (Non-Medical): No  Physical Activity: Not on file  Stress: Not on file  Social Connections: Not on file   Additional Social History:    Allergies:   Allergies  Allergen Reactions   Nickel Rash   Other Rash and Other (See Comments)    METAL.  HAS HAD PROBLEMS WITH KNEE REPLACEMENT.  [place] RASH/OOZE/ [place].  Allergen: Metals  Reaction: Hives, itching, rash, infection  Metal  Pt states other meds she cannot think of   Sulfamethoxazole-Trimethoprim Hives and Rash   Deutetrabenazine     Other Reaction(s): Other  "didn't work and was too expensive"   Pramipexole     "didn't work"   Statins Nausea And Vomiting and Rash    Makes her sick on stomach  Makes her sick on stomach  Makes her sick on stomach  Makes her sick on stomach  Joints ached   Amiodarone Other (See Comments)    Restless legs  Other Reaction(s): Not available   Metoprolol Other (See Comments)    Restless legs  Other Reaction(s): Not available   Benadryl [Diphenhydramine Hcl] Hives and Rash   Doxycycline Diarrhea    Labs:  No results found for this or any previous visit (from the past 64 hour(s)).    Current Facility-Administered Medications  Medication Dose Route Frequency Provider Last Rate Last Admin   acetaminophen (TYLENOL) tablet 650 mg  650 mg Oral Q6H PRN Howerter, Justin B, DO   650 mg at 07/07/22 1521   Or   acetaminophen (TYLENOL) suppository 650 mg  650 mg Rectal Q6H PRN Howerter, Justin B, DO       albuterol (PROVENTIL) (2.5 MG/3ML) 0.083% nebulizer solution  2.5 mg  2.5 mg Nebulization Q4H PRN Reubin Milan, MD   2.5 mg at 07/11/22 0809   amiodarone (PACERONE) tablet 200 mg  200 mg Oral BID Reubin Milan, MD   200 mg at 07/15/22 1031   apixaban (ELIQUIS) tablet 5 mg  5 mg Oral BID Howerter, Justin B, DO   5 mg at 07/15/22 1031   carbidopa-levodopa (SINEMET IR) 25-100 MG  per tablet immediate release 1 tablet  1 tablet Oral 3 times per day Reubin Milan, MD   1 tablet at 07/15/22 0830   DULoxetine (CYMBALTA) DR capsule 60 mg  60 mg Oral Daily Reubin Milan, MD   60 mg at 07/15/22 1031   furosemide (LASIX) tablet 40 mg  40 mg Oral Daily Reubin Milan, MD   40 mg at 07/15/22 1031   gabapentin (NEURONTIN) capsule 300 mg  300 mg Oral TID Reubin Milan, MD   300 mg at 07/15/22 1031   hydrOXYzine (ATARAX) tablet 25 mg  25 mg Oral Daily PRN Reubin Milan, MD   25 mg at 07/14/22 1633   levothyroxine (SYNTHROID) tablet 88 mcg  88 mcg Oral QAC breakfast Reubin Milan, MD   88 mcg at 07/15/22 [id number]   lip balm (CARMEX) ointment 1 Application  1 Application Topical PRN Kathryne Eriksson, NP   1 Application at [id number]   loperamide (IMODIUM) capsule 2 mg  2 mg Oral PRN Edwin Dada, MD   2 mg at 07/10/22 1312   metoprolol succinate (TOPROL-XL) 24 hr tablet 25 mg  25 mg Oral Daily Reubin Milan, MD   25 mg at 07/15/22 1031   OLANZapine (ZYPREXA) tablet 5 mg  5 mg Oral QHS Suella Broad, FNP   5 mg at 07/14/22 2102   Oral care mouth rinse  15 mL Mouth Rinse 4 times per day Reubin Milan, MD   15 mL at 07/14/22 1628   Oral care mouth rinse  15 mL Mouth Rinse PRN Reubin Milan, MD       oxyCODONE-acetaminophen (PERCOCET/ROXICET) 5-325 MG per tablet 1 tablet  1 tablet Oral TID PRN Tawnya Crook, RPH   1 tablet at 07/15/22 [id number]   And   oxyCODONE (Oxy IR/ROXICODONE) immediate release tablet 5 mg  5 mg Oral TID PRN Tawnya Crook, RPH   5 mg at 07/15/22 0516   pantoprazole  (PROTONIX) EC tablet 40 mg  40 mg Oral QHS Reubin Milan, MD   40 mg at 07/14/22 2101   potassium chloride (KLOR-CON M) CR tablet 10 mEq  10 mEq Oral Daily Reubin Milan, MD   10 mEq at 07/15/22 1031   valbenazine (INGREZZA) capsule 60 mg  60 mg Oral Daily Suella Broad, FNP   60 mg at 07/15/22 1031   vitamin B-12 (CYANOCOBALAMIN) tablet 500 mcg  500 mcg Oral Daily Edwin Dada, MD   500 mcg at 07/15/22 1034    Musculoskeletal: Strength & Muscle Tone: decreased Gait & Station: unsteady Patient leans: Left   Psychiatric Specialty Exam:  Presentation  General Appearance:  Appropriate for Environment; Casual  Eye Contact: Good  Speech: Clear and Coherent; Normal Rate  Speech Volume: Normal  Handedness: Right   Mood and Affect  Mood: Euthymic  Affect: Congruent; Appropriate   Thought Process  Thought Processes: Coherent; Linear  Descriptions of Associations:Intact  Orientation: Oriented to person, place, date, situation.  Thought Content: Logical; no overt delusional content on interview. When asked about prior delusional thought content towards husband, she reports being less bothered by this recently and unsure if it happened or not.   Hallucinations: Denies VH for the past 2 days .Denies AH.   Ideas of Reference:None  Suicidal Thoughts: denies   Homicidal Thoughts: denies    Sensorium  Memory: Immediate Good; Remote Good; Recent Good  Judgment: Good  Insight: Improving  Executive Functions  Concentration: Good  Attention Span: Able to spell WORLD backward without error.  Recall: [place] Major of Knowledge: Good  Language: Good  Psychomotor Activity  Psychomotor Activity: Normal  Assets  Assets: Communication Skills; Desire for Improvement; Social Support; Financial Resources/Insurance   Sleep  Sleep: fair   Physical Exam: Physical Exam Vitals and nursing note reviewed.  Constitutional:       Appearance: Normal appearance. She is normal weight.  HENT:     Head: Normocephalic.  Skin:    General: Skin is warm.  Neurological:     General: No focal deficit present.     Mental Status: She is alert and oriented to person, place, and time. Mental status is at baseline.  Psychiatric:        Attention and Perception: Attention normal. Visual hallucinations: denies.        Mood and Affect: Mood and affect normal.        Speech: Speech normal.        Behavior: Behavior normal. Behavior is cooperative.        Thought Content: Delusional: denies.        Judgment: Judgment normal.     Comments: Denies VH today; does not volunteer delusional thought content on interview    ROS: continues to report intermittent restless legs  Blood pressure (!) 133/91, pulse 78, temperature 98.5 F (36.9 C), temperature source Oral, resp. rate 18, height 5' (1.524 m), weight 70.9 kg, SpO2 95 %, unknown if currently breastfeeding. Body mass index is 30.54 kg/m.  Treatment Plan Summary: Daily contact with patient to assess and evaluate symptoms and progress in treatment, Medication management, and Plan   -Will continue olanzapine at 5mg  po qhs.   -Will continue Ingrezza 60mg  po daily for TD. Labs reviewed and assessed.  -Schedule melatonin nightly for circadian rhythm regulation -TOC consult for referral and appointment scheduling with outpatient psychiatry and [facility], and [facility] neurology.  Disposition: Discharged home.  Patient with notable improvement to psychiatric symptoms on olanzapine 5 mg and Ingrezza 60 mg.  Insufficient supply of medication available at [facility], [facility] pharmacy.  Provider contacted pharmacist [facility] and [facility], who will be able to provide a 2-week supply of Ingrezza.  This medication has been left at the front desk for patient to pick up after discharge today.  In regards to the olanzapine 5 mg, patient recently picked up prescription for ol[facility]apine 10 mg [facility], family has been instructed to half the dose of this medication.  Will place her following instructions and AVS and discharge.  Prior authorization has been submitted for [facility], however due to insurance and Coverage suspect patient will need additional samples until year starts over in January.  And or appeal can be taken place.  Allena Earing, FNP 07/15/2022 1:59 PM

## 2022-07-15 NOTE — TOC Benefit Eligibility Note (Signed)
Patient Product/process development scientist completed.    The patient is currently admitted and upon discharge could be taking Ingrezza.  The current 30 day co-pay is $1742.34. The patient is currently in the coverage gap.   The patient is insured through [facility] Part D

## 2022-07-15 NOTE — Care Management Important Message (Signed)
Important Message  Patient Details IM Letter given Name: [name] MRN: [id number] Date of Birth: 1946-08-07   Medicare Important Message Given:  Yes     Caren Macadam 07/15/2022, 12:04 PM

## 2022-07-15 NOTE — Discharge Instructions (Signed)
Recently new medication of Ingrezza (branded prescription) will require a prior authorization for insurance approval.  This has been initiated prior to discharge.  In order to prevent any further barriers of care and or access to this medication that has been proven to be effective for patient, a 2-week supply has been left at behavioral health Hospital.  Family has been instructed to retrieve this medication from [address] at the front desk.  Other medication changes that took place during this hospitalization include reduction of olanzapine from 10 mg to 5 mg.  New prescription of olanzapine recently picked up on November 16 from [facility] in [facility].  Family has been instructed to half this dose, to avoid any additional co-pays for this medication.  Will encourage follow-up with neurologist and outpatient psychiatry, for ongoing refills and or samples for this medication (Ingrezza) until start of the new year.  Or until an appeal can take place for Ingrezza to reduce cost.

## 2022-07-15 NOTE — Progress Notes (Signed)
The patient remains stable, no change from am assessment. Discharge instructions were reviewed with patient and her daughter. Education provided to patients daughter Selena Batten regarding medications Valbenazine (ingrezza) and Olanzapine (zyprexa).

## 2022-07-15 NOTE — Discharge Summary (Signed)
Physician Discharge Summary   Patient: [name] MRN: [id number] DOB: 10-Dec-1945  Admit date:     07/06/2022  Discharge date: 07/15/22  Discharge Physician: Cordelia Poche, MD   PCP: Kathreen Devoid, PA-C   Recommendations at discharge:  Per psychiatry, transition of care consulted for referral/appointment scheduling with outpatient psychiatry PCP follow-up Repeat CT chest in 2 months  Discharge Diagnoses: Active Problems:   CAP (community acquired pneumonia)   Paranoid schizophrenia ([place])   Secondary parkinsonism   Obesity (BMI 30-39.9)   PAF (paroxysmal atrial fibrillation) (HCC)   Essential hypertension   RLS (restless legs syndrome)   Hypothyroidism   Tardive dyskinesia   Left knee pain   Wheezing   Retinitis pigmentosa   Vitamin B12 deficiency   Aortic atherosclerosis ([place])  Principal Problem (Resolved):   Acute respiratory failure with hypoxia (HCC) Resolved Problems:   Prolonged QT interval  Hospital Course: [name] is a 76 y.o. female with a history of paroxysmal atrial fibrillation on Eliquis, major depression with psychotic features.,  Delusional disorder, dizziness hallucinations, diastolic heart failure, hypertension, hypothyroidism, retinitis pigmentosa, tardive dyskinesia.  Patient presented secondary to a fall with reports of worsening visual hallucinations.  Psychiatry consulted and medications have been adjusted.  And also with commune acquired pneumonia which responded well to antibiotic therapy.  Respiratory failure resolved with treatment of pneumonia.  Recommendation for inpatient psychiatry evaluation/admission however patient improved with change in therapy. Psychiatry recommendations to discharge home on Zyprexa 5 mg and Ingrezza 60 mg daily in addition to outpatient psychiatry follow-up.  Assessment and Plan: * Acute respiratory failure with hypoxia (HCC)-resolved as of 07/14/2022 Secondary to pneumonia. Resolved.  CAP  (community acquired pneumonia) CT chest was significant for severe multilobar bilateral bronchopneumonia.  Patient was treated with ceftriaxone and azithromycin/doxycycline and completed a 5-day course with improvement in symptoms.  Recommendation for repeat CT chest in 2 months.  Paranoid schizophrenia [facility]) Psychiatry consulted with recommendation for inpatient behavioral admission. Requip and Cogentin stopped with possible improvement in symptoms. Patient decreased to Zyprexa 5 mg daily and started on valbenazine. Patient developed delirium with benzodiazepines. Recommendation to avoid benzodiazepines. Psychiatry with recommendations for Zyprexa 5 mg daily and Ingrezza 60 mg daily  Secondary parkinsonism Patient is on Sinemet and Requip as an outpatient. Requip held. Continue Sinemet.  Vitamin B12 deficiency Continue Vitamin B12 supplementation.  Retinitis pigmentosa Legally blind.  Wheezing Albuterol as needed provided.  Left knee pain Orthopedic surgery consulted. Low concern for septic arthritis. Symptoms improved. Continue oxycodone and gabapentin.  Tardive dyskinesia Patient started on valbenazine  Hypothyroidism Continue Synthroid  RLS (restless legs syndrome) Patient previously on Requip which was discontinued secondary to concern for contributing to confusion. Patient started on valbenazine  Essential hypertension Continue metoprolol.  PAF (paroxysmal atrial fibrillation) (HCC) History of atrial thrombus. Patient is managed on Eliquis, amiodarone and metoprolol. Continue home regimen.  Obesity (BMI 30-39.9) Estimated body mass index is 30.54 kg/m as calculated from the following:   Height as of this encounter: 5' (1.524 m).   Weight as of this encounter: 70.9 kg.  Prolonged QT interval-resolved as of 07/14/2022 Resolved on most recent EKG.   Consultants: Psychiatry Procedures performed: None  Disposition: Home Diet recommendation: Regular  diet   DISCHARGE MEDICATION: Allergies as of 07/15/2022       Reactions   Nickel Rash   Other Rash, Other (See Comments)   METAL.  HAS HAD PROBLEMS WITH KNEE REPLACEMENT.  CANNOT WEAR COSTUME JEWELRY - CAUSES RASH/OOZE/  ITCH.  Allergen: Metals  Reaction: Hives, itching, rash, infection  Metal Pt states other meds she cannot think of   Sulfamethoxazole-trimethoprim Hives, Rash   Deutetrabenazine    Other Reaction(s): Other "didn't work and was too expensive"   Pramipexole    "didn't work"   Statins Nausea And Vomiting, Rash   Makes her sick on stomach Makes her sick on stomach  Makes her sick on stomach  Makes her sick on stomach Joints ached   Amiodarone Other (See Comments)   Restless legs Other Reaction(s): Not available   Metoprolol Other (See Comments)   Restless legs Other Reaction(s): Not available   Benadryl [diphenhydramine Hcl] Hives, Rash   Doxycycline Diarrhea        Medication List     STOP taking these medications    benztropine 0.5 MG tablet Commonly known as: COGENTIN   cefadroxil 500 MG capsule Commonly known as: DURICEF   cephALEXin 500 MG capsule Commonly known as: KEFLEX   ferrous sulfate 325 (65 FE) MG tablet Commonly known as: FerrouSul   ibuprofen 800 MG tablet Commonly known as: ADVIL   rOPINIRole 2 MG tablet Commonly known as: REQUIP   traMADol 50 MG tablet Commonly known as: ULTRAM   traZODone 150 MG tablet Commonly known as: DESYREL       TAKE these medications    albuterol 108 (90 Base) MCG/ACT inhaler Commonly known as: VENTOLIN HFA Inhale 2 puffs into the lungs every 6 (six) hours as needed for wheezing or shortness of breath.   amiodarone 200 MG tablet Commonly known as: PACERONE Take 200 mg by mouth 2 (two) times daily.   apixaban 5 MG Tabs tablet Commonly known as: ELIQUIS Take 5 mg by mouth in the morning and at bedtime.   carbidopa-levodopa 25-100 MG tablet Commonly known as: SINEMET IR Take 1  tablet by mouth See admin instructions. Take 1 tablet by mouth with breakfast, supper, and at bedtime   diclofenac 75 MG EC tablet Commonly known as: VOLTAREN Take 75 mg by mouth 2 (two) times daily as needed for mild pain.   diclofenac Sodium 1 % Gel Commonly known as: VOLTAREN Apply 2-4 g topically 4 (four) times daily as needed (to affected sites- for pain).   docusate sodium 100 MG capsule Commonly known as: Colace Take 1 capsule (100 mg total) by mouth 2 (two) times daily.   DULoxetine 60 MG capsule Commonly known as: CYMBALTA Take 60 mg by mouth in the morning.   furosemide 20 MG tablet Commonly known as: LASIX Take 40 mg by mouth in the morning.   gabapentin 300 MG capsule Commonly known as: NEURONTIN Take 300 mg by mouth in the morning, at noon, and at bedtime.   hydrOXYzine 25 MG tablet Commonly known as: ATARAX Take 25 mg by mouth daily as needed for anxiety.   levothyroxine 88 MCG tablet Commonly known as: SYNTHROID Take 88 mcg by mouth daily before breakfast.   loperamide 2 MG capsule Commonly known as: IMODIUM Take 1 capsule (2 mg total) by mouth as needed for diarrhea or loose stools.   metoprolol succinate 25 MG 24 hr tablet Commonly known as: TOPROL-XL Take 25 mg by mouth in the morning.   OLANZapine 5 MG tablet Commonly known as: ZYPREXA Take 1 tablet (5 mg total) by mouth at bedtime. What changed:  medication strength how much to take   ondansetron 4 MG tablet Commonly known as: ZOFRAN Take 4 mg by mouth every 8 (eight) hours as  needed for nausea or vomiting.   oxyCODONE-acetaminophen 10-325 MG tablet Commonly known as: PERCOCET Take 1 tablet by mouth 3 (three) times daily as needed for pain.   pantoprazole 40 MG tablet Commonly known as: PROTONIX Take 40 mg by mouth daily as needed (for reflux and take 30 minutes before the meal).   polyethylene glycol 17 g packet Commonly known as: MIRALAX / GLYCOLAX Take 17 g by mouth 2 (two) times  daily.   potassium chloride 10 MEQ tablet Commonly known as: KLOR-CON M Take 10 mEq by mouth daily.   valbenazine 60 MG capsule Commonly known as: INGREZZA Take 1 capsule (60 mg total) by mouth daily. Start taking on: July 16, 2022   vitamin B-12 100 MCG tablet Commonly known as: CYANOCOBALAMIN Take 1 tablet (100 mcg total) by mouth daily. Start taking on: July 16, 2022        Follow-up Information     Evette Doffing [place], [place]. Schedule an appointment as soon as possible for a visit in 1 week(s).   Specialty: Physician Assistant Why: For hospital follow-up Contact information: [address] [place] [address] [phone]                Discharge Exam: BP (!) 133/91 (BP Location: Left Arm)   Pulse 78   Temp 98.5 F (36.9 C) (Oral)   Resp 18   Ht 5' (1.524 m)   Wt 70.9 kg   SpO2 95%   BMI 30.54 kg/m   General exam: Appears calm and comfortable Respiratory system: Clear to auscultation. Respiratory effort normal. Cardiovascular system: S1 & S2 heard, RRR. Gastrointestinal system: Abdomen is nondistended, soft and nontender. Normal bowel sounds heard. Central nervous system: Alert and oriented. No focal neurological deficits. Musculoskeletal: No edema. No calf tenderness Skin: No cyanosis. No rashes Psychiatry: Judgement and insight appear normal. Mood & affect appropriate.   Condition at discharge: stable  The results of significant diagnostics from this hospitalization (including imaging, microbiology, ancillary and laboratory) are listed below for reference.   Imaging Studies: CT HEAD WO CONTRAST (5MM)  Result Date: 07/07/2022 CLINICAL DATA:  Memory loss. Altered mental status. EXAM: CT HEAD WITHOUT CONTRAST TECHNIQUE: Contiguous axial images were obtained from the base of the skull through the vertex without intravenous contrast. RADIATION DOSE REDUCTION: This exam was performed according to the departmental  dose-optimization program which includes automated exposure control, adjustment of the mA and/or kV according to patient size and/or use of iterative reconstruction technique. COMPARISON:  None Available. FINDINGS: Brain: No evidence of acute infarction, hemorrhage, hydrocephalus, extra-axial collection or mass lesion/mass effect. Moderate atrophy. Mild periventricular deep white matter hypodensity typical of chronic small vessel ischemic change. Vascular: Atherosclerosis of skullbase vasculature without hyperdense vessel or abnormal calcification. Skull: No fracture or focal lesion. Sinuses/Orbits: Paranasal sinuses and mastoid air cells are clear. The visualized orbits are unremarkable. Other: None. IMPRESSION: 1. No acute intracranial abnormality. 2. Moderate atrophy with mild chronic small vessel ischemic change. Electronically Signed   By: Keith Rake M.D.   On: 07/07/2022 20:12   CT Angio Chest PE W/Cm &/Or Wo Cm  Result Date: 07/07/2022 CLINICAL DATA:  76 year old female with history of knee pain. Clinical suspicion for pulmonary embolism. EXAM: CT ANGIOGRAPHY CHEST WITH CONTRAST TECHNIQUE: Multidetector CT imaging of the chest was performed using the standard protocol during bolus administration of intravenous contrast. Multiplanar CT image reconstructions and MIPs were obtained to evaluate the vascular anatomy. RADIATION DOSE REDUCTION: This exam was performed according  to the departmental dose-optimization program which includes automated exposure control, adjustment of the mA and/or kV according to patient size and/or use of iterative reconstruction technique. CONTRAST:  162mL OMNIPAQUE IOHEXOL 350 MG/ML SOLN COMPARISON:  No priors. FINDINGS: Cardiovascular: Study is severely limited by extensive patient respiratory motion. With these limitations in mind, there is no central, lobar or proximal segmental sized pulmonary embolism. Distal segmental and subsegmental sized emboli can not be entirely  excluded on the basis of today's examination. Heart size is enlarged with left atrial dilatation. There is no significant pericardial fluid, thickening or pericardial calcification. Atherosclerotic calcifications in the thoracic aorta. No definite coronary artery calcifications. Mediastinum/Nodes: Multiple prominent borderline enlarged and mildly enlarged mediastinal and bilateral hilar lymph nodes are noted, measuring up to 1.8 cm in short axis in the subcarinal nodal station, 1.5 cm in short axis in the low right paratracheal nodal station, and 1.3 cm in short axis in the prevascular nodal station. Esophagus is unremarkable in appearance. No axillary lymphadenopathy. Lungs/Pleura: Evaluation of the lung parenchyma is severely limited by extensive patient respiratory motion. With these limitations in mind there is widespread ground-glass attenuation, septal thickening and nodular appearing areas of airspace consolidation scattered throughout all aspects of the lungs bilaterally. No pleural effusions. No pneumothorax. Upper Abdomen: Aortic atherosclerosis. Musculoskeletal: Orthopedic fixation hardware in the lower cervical spine incidentally noted. Small sclerotic lesion with narrow zone of transition in the anterolateral aspect of the right fifth rib (axial image 74 of series 4), favored to represent a tiny bone island. There are no other aggressive appearing lytic or blastic lesions noted in the visualized portions of the skeleton. Review of the MIP images confirms the above findings. IMPRESSION: 1. Severely limited examination secondary to patient respiratory motion. No definite central, lobar or proximal segmental sized pulmonary emboli. 2. The appearance of the lungs is concerning for severe multilobar bilateral bronchopneumonia. Given the presence of cardiomegaly, the possibility of a background of mild interstitial pulmonary edema is also suspected. 3. Mediastinal and hilar lymphadenopathy, presumably  reactive in the setting of acute infection and congestive heart failure. However, repeat noncontrast chest CT is recommended in 2 months to ensure regression of these findings after resolution of the patient's acute illness, to exclude underlying malignancy. 4. Aortic atherosclerosis. Aortic Atherosclerosis (ICD10-I70.0). Electronically Signed   By: Vinnie Langton M.D.   On: 07/07/2022 07:57   DG Tibia/Fibula Left  Result Date: 07/07/2022 CLINICAL DATA:  76 year old female with history of shortness of breath. Fall. Left leg pain. EXAM: LEFT TIBIA AND FIBULA - 2 VIEW COMPARISON:  Left knee radiographs 07/07/2022. CT of the left knee 11/13/2016. FINDINGS: Multiple views of the left tibia and fibula demonstrate postoperative changes of left total knee arthroplasty. Cerclage wires are noted adjacent to the distal femoral diaphysis. Lucency along the medial femoral condyle just lateral to the femoral prosthesis, potentially reflecting loosening of the hardware (although no prior studies are available for comparison following implantation of the hardware). Tibia and fibula appear intact without definite acute displaced fracture. There is a small amount of lucency adjacent to the distal aspect of the tibial component of the prosthesis, which may suggest some mild loosening of the hardware in this region as well. In addition, there is loss of bone in the medial tibial plateau, which could be chronic based on comparison with remote prior CT examination. Mild diffuse soft tissue swelling. IMPRESSION: 1. Status post left total knee arthroplasty, with lucency adjacent to both the tibial and femoral components of the  prosthesis, as above. Based on comparison with remote prior CT examination (before the implantation of the knee prosthesis) these areas of lucency adjacent to the knee joint may be chronic, although there is some lucency adjacent to the distal aspect of the tibial stem, suggesting some degree of motion of the  prosthesis. 2. Negative for acute fracture. Electronically Signed   By: Trudie Reed M.D.   On: 07/07/2022 05:28   DG Chest Port 1 View  Result Date: 07/07/2022 CLINICAL DATA:  Dyspnea EXAM: PORTABLE CHEST 1 VIEW COMPARISON:  12/26/2018 FINDINGS: Stable cardiomegaly. Stable mediastinal contours accounting for leftward rotation. Coarsening of lung markings which is chronic and generalized. Indistinct density over the left diaphragm with poorly visualized lateral diaphragm. No visible effusion or pneumothorax. ACDF hardware. IMPRESSION: Low volume chest accentuating chronic interstitial opacity. Poorly defined left diaphragm which could be from atelectasis or pneumonia. Electronically Signed   By: Tiburcio Pea M.D.   On: 07/07/2022 05:24   DG Knee Complete 4 Views Left  Result Date: 07/07/2022 CLINICAL DATA:  Left posterior knee pain.  Fall. EXAM: LEFT KNEE - COMPLETE 4+ VIEW COMPARISON:  None Available. FINDINGS: Changes of left knee replacement. No acute fracture, subluxation or dislocation. Lucency around the distal femoral component could reflect loosening. No joint effusion. IMPRESSION: No acute posttraumatic bone abnormality. Lucency around the distal femoral component of the left knee replacement suggesting loosening. Electronically Signed   By: Charlett Nose M.D.   On: 07/07/2022 00:40    Microbiology: Results for orders placed or performed during the hospital encounter of 07/06/22  Resp Panel by RT-PCR (Flu A&B, Covid) Anterior Nasal Swab     Status: None   Collection Time: 07/07/22  3:36 AM   Specimen: Anterior Nasal Swab  Result Value Ref Range Status   SARS Coronavirus 2 by RT PCR NEGATIVE NEGATIVE Final    Comment: (NOTE) SARS-CoV-2 target nucleic acids are NOT DETECTED.  The SARS-CoV-2 RNA is generally detectable in upper respiratory specimens during the acute phase of infection. The lowest concentration of SARS-CoV-2 viral copies this assay can detect is 138 copies/mL. A  negative result does not preclude SARS-Cov-2 infection and should not be used as the sole basis for treatment or other patient management decisions. A negative result may occur with  improper specimen collection/handling, submission of specimen other than nasopharyngeal swab, presence of viral mutation(s) within the areas targeted by this assay, and inadequate number of viral copies(<138 copies/mL). A negative result must be combined with clinical observations, patient history, and epidemiological information. The expected result is Negative.  Fact Sheet for Patients:  BloggerCourse.com  Fact Sheet for Healthcare Providers:  SeriousBroker.it  This test is no t yet approved or cleared by the [place] FDA and  has been authorized for detection and/or diagnosis of SARS-CoV-2 by FDA under an Emergency Use Authorization (EUA). This EUA will remain  in effect (meaning this test can be used) for the duration of the COVID-19 declaration under Section 564(b)(1) of the Act, 21 U.S.C.section 360bbb-3(b)(1), unless the authorization is terminated  or revoked sooner.       Influenza A by PCR NEGATIVE NEGATIVE Final   Influenza B by PCR NEGATIVE NEGATIVE Final    Comment: (NOTE) The Xpert Xpress SARS-CoV-2/FLU/RSV plus assay is intended as an aid in the diagnosis of influenza from Nasopharyngeal swab specimens and should not be used as a sole basis for treatment. Nasal washings and aspirates are unacceptable for Xpert Xpress SARS-CoV-2/FLU/RSV testing.  Fact Sheet for  Patients: EntrepreneurPulse.com.au  Fact Sheet for Healthcare Providers: IncredibleEmployment.be  This test is not yet approved or cleared by the [place] FDA and has been authorized for detection and/or diagnosis of SARS-CoV-2 by FDA under an Emergency Use Authorization (EUA). This EUA will remain in effect (meaning this test can  be used) for the duration of the COVID-19 declaration under Section 564(b)(1) of the Act, 21 U.S.C. section 360bbb-3(b)(1), unless the authorization is terminated or revoked.  Performed at [facility], [place] [address], [place], [place] [address]     Labs: CBC: Recent Labs  Lab 07/09/22 [id number] 07/10/22 0808 07/13/22 0741  WBC 11.1* 11.1* 8.9  HGB 10.8* 12.0 11.6*  HCT 34.7* 38.9 38.2  MCV 96.4 96.5 96.0  PLT 237 284 [id number]   Basic Metabolic Panel: Recent Labs  Lab 07/09/22 0412 07/10/22 0808 07/12/22 1005 07/13/22 0741  NA 136 138 138 138  K 4.3 4.6 3.7 3.6  CL 105 104 102 103  CO2 24 24 26 25   GLUCOSE 130* 131* 120* 110*  BUN 15 13 14 16   CREATININE 0.87 0.89 0.87 0.82  CALCIUM 8.3* 9.0 9.2 8.8*  MG 2.4  --   --   --    Liver Function Tests: Recent Labs  Lab 07/09/22 0412 07/10/22 0808 07/12/22 1005  AST 17 22 17   ALT 8 17 6   ALKPHOS 77 84 89  BILITOT 0.7 0.6 0.7  PROT 6.2* 7.2 7.2  ALBUMIN 3.2* 3.6 3.7   Discharge time spent: 35 minutes.  Signed: Cordelia Poche, MD [facility] 07/15/2022

## 2022-07-16 ENCOUNTER — Other Ambulatory Visit (HOSPITAL_COMMUNITY): Payer: Self-pay

## 2022-07-17 ENCOUNTER — Other Ambulatory Visit (HOSPITAL_COMMUNITY): Payer: Self-pay

## 2022-07-25 ENCOUNTER — Other Ambulatory Visit (HOSPITAL_COMMUNITY): Payer: Self-pay

## 2023-02-17 DEATH — deceased
